# Patient Record
Sex: Male | Born: 1950 | Race: White | Hispanic: No | Marital: Single | Smoking: Never smoker
Health system: Southern US, Community
[De-identification: ages and names within clinical notes are randomized; demographics above are authoritative.]

## PROBLEM LIST (undated history)

## (undated) DIAGNOSIS — Z7901 Long term (current) use of anticoagulants: Secondary | ICD-10-CM

## (undated) DIAGNOSIS — I314 Cardiac tamponade: Secondary | ICD-10-CM

## (undated) DIAGNOSIS — Q231 Congenital insufficiency of aortic valve: Secondary | ICD-10-CM

## (undated) DIAGNOSIS — R51 Headache: Secondary | ICD-10-CM

## (undated) DIAGNOSIS — F419 Anxiety disorder, unspecified: Secondary | ICD-10-CM

## (undated) DIAGNOSIS — M869 Osteomyelitis, unspecified: Secondary | ICD-10-CM

## (undated) DIAGNOSIS — Q2381 Bicuspid aortic valve: Secondary | ICD-10-CM

## (undated) DIAGNOSIS — K659 Peritonitis, unspecified: Secondary | ICD-10-CM

## (undated) DIAGNOSIS — R630 Anorexia: Secondary | ICD-10-CM

## (undated) DIAGNOSIS — R519 Headache, unspecified: Secondary | ICD-10-CM

## (undated) DIAGNOSIS — E039 Hypothyroidism, unspecified: Secondary | ICD-10-CM

## (undated) DIAGNOSIS — J189 Pneumonia, unspecified organism: Secondary | ICD-10-CM

## (undated) DIAGNOSIS — I639 Cerebral infarction, unspecified: Secondary | ICD-10-CM

## (undated) DIAGNOSIS — M1990 Unspecified osteoarthritis, unspecified site: Secondary | ICD-10-CM

## (undated) HISTORY — PX: CARDIAC SURGERY: SHX584

## (undated) HISTORY — DX: Anorexia: R63.0

---

## 1998-07-29 ENCOUNTER — Emergency Department (HOSPITAL_COMMUNITY): Admission: EM | Admit: 1998-07-29 | Discharge: 1998-07-29 | Payer: Self-pay | Admitting: Emergency Medicine

## 2011-05-21 HISTORY — PX: AORTIC VALVE REPLACEMENT: SHX41

## 2016-11-28 ENCOUNTER — Emergency Department (HOSPITAL_COMMUNITY): Payer: BLUE CROSS/BLUE SHIELD

## 2016-11-28 ENCOUNTER — Inpatient Hospital Stay (HOSPITAL_COMMUNITY): Payer: BLUE CROSS/BLUE SHIELD | Admitting: Certified Registered"

## 2016-11-28 ENCOUNTER — Encounter (HOSPITAL_COMMUNITY): Admission: EM | Disposition: A | Discharge status: Skilled Nursing Facility | Payer: Self-pay | Source: Home / Self Care

## 2016-11-28 ENCOUNTER — Encounter (HOSPITAL_COMMUNITY): Payer: Self-pay | Admitting: Interventional Radiology

## 2016-11-28 ENCOUNTER — Inpatient Hospital Stay (HOSPITAL_COMMUNITY): Payer: BLUE CROSS/BLUE SHIELD | Admitting: Anesthesiology

## 2016-11-28 ENCOUNTER — Inpatient Hospital Stay (HOSPITAL_COMMUNITY): Payer: BLUE CROSS/BLUE SHIELD

## 2016-11-28 ENCOUNTER — Inpatient Hospital Stay (HOSPITAL_COMMUNITY)
Admission: EM | Admit: 2016-11-28 | Discharge: 2017-01-01 | DRG: 957 | Disposition: A | Discharge status: Skilled Nursing Facility | Payer: BLUE CROSS/BLUE SHIELD | Attending: General Surgery | Admitting: General Surgery

## 2016-11-28 ENCOUNTER — Encounter (HOSPITAL_COMMUNITY): Payer: Self-pay | Admitting: Anesthesiology

## 2016-11-28 DIAGNOSIS — Z7901 Long term (current) use of anticoagulants: Secondary | ICD-10-CM

## 2016-11-28 DIAGNOSIS — M8618 Other acute osteomyelitis, other site: Secondary | ICD-10-CM | POA: Diagnosis not present

## 2016-11-28 DIAGNOSIS — R609 Edema, unspecified: Secondary | ICD-10-CM

## 2016-11-28 DIAGNOSIS — D6489 Other specified anemias: Secondary | ICD-10-CM | POA: Diagnosis present

## 2016-11-28 DIAGNOSIS — S329XXA Fracture of unspecified parts of lumbosacral spine and pelvis, initial encounter for closed fracture: Secondary | ICD-10-CM | POA: Diagnosis present

## 2016-11-28 DIAGNOSIS — T826XXA Infection and inflammatory reaction due to cardiac valve prosthesis, initial encounter: Secondary | ICD-10-CM | POA: Diagnosis not present

## 2016-11-28 DIAGNOSIS — M86151 Other acute osteomyelitis, right femur: Secondary | ICD-10-CM | POA: Diagnosis not present

## 2016-11-28 DIAGNOSIS — S83521A Sprain of posterior cruciate ligament of right knee, initial encounter: Secondary | ICD-10-CM | POA: Diagnosis present

## 2016-11-28 DIAGNOSIS — I441 Atrioventricular block, second degree: Secondary | ICD-10-CM | POA: Diagnosis present

## 2016-11-28 DIAGNOSIS — T148XXA Other injury of unspecified body region, initial encounter: Secondary | ICD-10-CM

## 2016-11-28 DIAGNOSIS — S62312A Displaced fracture of base of third metacarpal bone, right hand, initial encounter for closed fracture: Secondary | ICD-10-CM | POA: Diagnosis present

## 2016-11-28 DIAGNOSIS — M25362 Other instability, left knee: Secondary | ICD-10-CM | POA: Diagnosis present

## 2016-11-28 DIAGNOSIS — I38 Endocarditis, valve unspecified: Secondary | ICD-10-CM | POA: Diagnosis not present

## 2016-11-28 DIAGNOSIS — S32110A Nondisplaced Zone I fracture of sacrum, initial encounter for closed fracture: Secondary | ICD-10-CM | POA: Diagnosis present

## 2016-11-28 DIAGNOSIS — E870 Hyperosmolality and hypernatremia: Secondary | ICD-10-CM

## 2016-11-28 DIAGNOSIS — T1490XA Injury, unspecified, initial encounter: Secondary | ICD-10-CM | POA: Diagnosis not present

## 2016-11-28 DIAGNOSIS — B9689 Other specified bacterial agents as the cause of diseases classified elsewhere: Secondary | ICD-10-CM | POA: Diagnosis present

## 2016-11-28 DIAGNOSIS — Z452 Encounter for adjustment and management of vascular access device: Secondary | ICD-10-CM

## 2016-11-28 DIAGNOSIS — N4889 Other specified disorders of penis: Secondary | ICD-10-CM | POA: Diagnosis present

## 2016-11-28 DIAGNOSIS — J9690 Respiratory failure, unspecified, unspecified whether with hypoxia or hypercapnia: Secondary | ICD-10-CM | POA: Diagnosis present

## 2016-11-28 DIAGNOSIS — S92101A Unspecified fracture of right talus, initial encounter for closed fracture: Secondary | ICD-10-CM | POA: Diagnosis present

## 2016-11-28 DIAGNOSIS — S332XXA Dislocation of sacroiliac and sacrococcygeal joint, initial encounter: Secondary | ICD-10-CM | POA: Diagnosis present

## 2016-11-28 DIAGNOSIS — S32810A Multiple fractures of pelvis with stable disruption of pelvic ring, initial encounter for closed fracture: Principal | ICD-10-CM | POA: Diagnosis present

## 2016-11-28 DIAGNOSIS — N3289 Other specified disorders of bladder: Secondary | ICD-10-CM | POA: Diagnosis not present

## 2016-11-28 DIAGNOSIS — M545 Low back pain: Secondary | ICD-10-CM | POA: Diagnosis present

## 2016-11-28 DIAGNOSIS — R739 Hyperglycemia, unspecified: Secondary | ICD-10-CM | POA: Diagnosis not present

## 2016-11-28 DIAGNOSIS — R0902 Hypoxemia: Secondary | ICD-10-CM

## 2016-11-28 DIAGNOSIS — T847XXA Infection and inflammatory reaction due to other internal orthopedic prosthetic devices, implants and grafts, initial encounter: Secondary | ICD-10-CM

## 2016-11-28 DIAGNOSIS — Z952 Presence of prosthetic heart valve: Secondary | ICD-10-CM | POA: Diagnosis not present

## 2016-11-28 DIAGNOSIS — S334XXA Traumatic rupture of symphysis pubis, initial encounter: Secondary | ICD-10-CM | POA: Diagnosis present

## 2016-11-28 DIAGNOSIS — E039 Hypothyroidism, unspecified: Secondary | ICD-10-CM | POA: Diagnosis present

## 2016-11-28 DIAGNOSIS — S329XXD Fracture of unspecified parts of lumbosacral spine and pelvis, subsequent encounter for fracture with routine healing: Secondary | ICD-10-CM | POA: Diagnosis not present

## 2016-11-28 DIAGNOSIS — Z9289 Personal history of other medical treatment: Secondary | ICD-10-CM

## 2016-11-28 DIAGNOSIS — T826XXD Infection and inflammatory reaction due to cardiac valve prosthesis, subsequent encounter: Secondary | ICD-10-CM | POA: Diagnosis not present

## 2016-11-28 DIAGNOSIS — I330 Acute and subacute infective endocarditis: Secondary | ICD-10-CM

## 2016-11-28 DIAGNOSIS — Z8673 Personal history of transient ischemic attack (TIA), and cerebral infarction without residual deficits: Secondary | ICD-10-CM

## 2016-11-28 DIAGNOSIS — M25561 Pain in right knee: Secondary | ICD-10-CM | POA: Diagnosis not present

## 2016-11-28 DIAGNOSIS — M25562 Pain in left knee: Secondary | ICD-10-CM | POA: Diagnosis not present

## 2016-11-28 DIAGNOSIS — A499 Bacterial infection, unspecified: Secondary | ICD-10-CM | POA: Diagnosis not present

## 2016-11-28 DIAGNOSIS — R310 Gross hematuria: Secondary | ICD-10-CM | POA: Diagnosis not present

## 2016-11-28 DIAGNOSIS — M25329 Other instability, unspecified elbow: Secondary | ICD-10-CM

## 2016-11-28 DIAGNOSIS — R52 Pain, unspecified: Secondary | ICD-10-CM

## 2016-11-28 DIAGNOSIS — R55 Syncope and collapse: Secondary | ICD-10-CM | POA: Diagnosis not present

## 2016-11-28 DIAGNOSIS — R1313 Dysphagia, pharyngeal phase: Secondary | ICD-10-CM | POA: Diagnosis present

## 2016-11-28 DIAGNOSIS — S3681XA Injury of peritoneum, initial encounter: Secondary | ICD-10-CM | POA: Diagnosis present

## 2016-11-28 DIAGNOSIS — S82202B Unspecified fracture of shaft of left tibia, initial encounter for open fracture type I or II: Secondary | ICD-10-CM | POA: Diagnosis present

## 2016-11-28 DIAGNOSIS — S62151A Displaced fracture of hook process of hamate [unciform] bone, right wrist, initial encounter for closed fracture: Secondary | ICD-10-CM | POA: Diagnosis present

## 2016-11-28 DIAGNOSIS — D62 Acute posthemorrhagic anemia: Secondary | ICD-10-CM | POA: Diagnosis present

## 2016-11-28 DIAGNOSIS — T814XXA Infection following a procedure, initial encounter: Secondary | ICD-10-CM | POA: Diagnosis not present

## 2016-11-28 DIAGNOSIS — S86922A Laceration of unspecified muscle(s) and tendon(s) at lower leg level, left leg, initial encounter: Secondary | ICD-10-CM | POA: Diagnosis present

## 2016-11-28 DIAGNOSIS — S299XXA Unspecified injury of thorax, initial encounter: Secondary | ICD-10-CM

## 2016-11-28 DIAGNOSIS — S299XXD Unspecified injury of thorax, subsequent encounter: Secondary | ICD-10-CM | POA: Diagnosis not present

## 2016-11-28 DIAGNOSIS — J9811 Atelectasis: Secondary | ICD-10-CM | POA: Diagnosis not present

## 2016-11-28 DIAGNOSIS — R3915 Urgency of urination: Secondary | ICD-10-CM | POA: Diagnosis not present

## 2016-11-28 DIAGNOSIS — R0682 Tachypnea, not elsewhere classified: Secondary | ICD-10-CM | POA: Diagnosis not present

## 2016-11-28 DIAGNOSIS — S22021A Stable burst fracture of second thoracic vertebra, initial encounter for closed fracture: Secondary | ICD-10-CM | POA: Diagnosis present

## 2016-11-28 DIAGNOSIS — S83511A Sprain of anterior cruciate ligament of right knee, initial encounter: Secondary | ICD-10-CM | POA: Diagnosis present

## 2016-11-28 DIAGNOSIS — R571 Hypovolemic shock: Secondary | ICD-10-CM | POA: Diagnosis present

## 2016-11-28 DIAGNOSIS — S92211A Displaced fracture of cuboid bone of right foot, initial encounter for closed fracture: Secondary | ICD-10-CM | POA: Diagnosis present

## 2016-11-28 DIAGNOSIS — M12559 Traumatic arthropathy, unspecified hip: Secondary | ICD-10-CM

## 2016-11-28 DIAGNOSIS — D72829 Elevated white blood cell count, unspecified: Secondary | ICD-10-CM

## 2016-11-28 DIAGNOSIS — T847XXD Infection and inflammatory reaction due to other internal orthopedic prosthetic devices, implants and grafts, subsequent encounter: Secondary | ICD-10-CM | POA: Diagnosis not present

## 2016-11-28 DIAGNOSIS — I350 Nonrheumatic aortic (valve) stenosis: Secondary | ICD-10-CM | POA: Diagnosis not present

## 2016-11-28 DIAGNOSIS — Z978 Presence of other specified devices: Secondary | ICD-10-CM

## 2016-11-28 DIAGNOSIS — Z419 Encounter for procedure for purposes other than remedying health state, unspecified: Secondary | ICD-10-CM

## 2016-11-28 DIAGNOSIS — J942 Hemothorax: Secondary | ICD-10-CM

## 2016-11-28 DIAGNOSIS — Y92411 Interstate highway as the place of occurrence of the external cause: Secondary | ICD-10-CM

## 2016-11-28 DIAGNOSIS — Q899 Congenital malformation, unspecified: Secondary | ICD-10-CM

## 2016-11-28 DIAGNOSIS — S63054A Dislocation of other carpometacarpal joint of right hand, initial encounter: Secondary | ICD-10-CM

## 2016-11-28 DIAGNOSIS — R001 Bradycardia, unspecified: Secondary | ICD-10-CM | POA: Diagnosis not present

## 2016-11-28 DIAGNOSIS — S63054D Dislocation of other carpometacarpal joint of right hand, subsequent encounter: Secondary | ICD-10-CM | POA: Diagnosis not present

## 2016-11-28 DIAGNOSIS — S32810D Multiple fractures of pelvis with stable disruption of pelvic ring, subsequent encounter for fracture with routine healing: Secondary | ICD-10-CM | POA: Diagnosis not present

## 2016-11-28 DIAGNOSIS — I499 Cardiac arrhythmia, unspecified: Secondary | ICD-10-CM | POA: Diagnosis not present

## 2016-11-28 DIAGNOSIS — M25569 Pain in unspecified knee: Secondary | ICD-10-CM

## 2016-11-28 DIAGNOSIS — R451 Restlessness and agitation: Secondary | ICD-10-CM | POA: Diagnosis not present

## 2016-11-28 DIAGNOSIS — D696 Thrombocytopenia, unspecified: Secondary | ICD-10-CM | POA: Diagnosis not present

## 2016-11-28 HISTORY — DX: Hypothyroidism, unspecified: E03.9

## 2016-11-28 HISTORY — PX: ULTRASOUND GUIDANCE FOR VASCULAR ACCESS: SHX6516

## 2016-11-28 HISTORY — PX: I&D EXTREMITY: SHX5045

## 2016-11-28 HISTORY — PX: IR HYBRID TRAUMA EMBOLIZATION: IMG5539

## 2016-11-28 HISTORY — PX: EXTERNAL FIXATION PELVIS: SHX1551

## 2016-11-28 HISTORY — PX: PELVIC ANGIOGRAPHY: CATH118254

## 2016-11-28 HISTORY — PX: APPLICATION OF WOUND VAC: SHX5189

## 2016-11-28 HISTORY — PX: SACROILIAC JOINT FUSION: SHX6088

## 2016-11-28 HISTORY — DX: Bicuspid aortic valve: Q23.81

## 2016-11-28 HISTORY — DX: Congenital insufficiency of aortic valve: Q23.1

## 2016-11-28 LAB — POCT I-STAT 7, (LYTES, BLD GAS, ICA,H+H)
ACID-BASE DEFICIT: 9 mmol/L — AB (ref 0.0–2.0)
ACID-BASE DEFICIT: 8 mmol/L — AB (ref 0.0–2.0)
Acid-base deficit: 7 mmol/L — ABNORMAL HIGH (ref 0.0–2.0)
Acid-base deficit: 4 mmol/L — ABNORMAL HIGH (ref 0.0–2.0)
BICARBONATE: 18.1 mmol/L — AB (ref 20.0–28.0)
BICARBONATE: 21.7 mmol/L (ref 20.0–28.0)
Bicarbonate: 19.8 mmol/L — ABNORMAL LOW (ref 20.0–28.0)
Bicarbonate: 17.3 mmol/L — ABNORMAL LOW (ref 20.0–28.0)
CALCIUM ION: 0.94 mmol/L — AB (ref 1.15–1.40)
Calcium, Ion: 1.01 mmol/L — ABNORMAL LOW (ref 1.15–1.40)
Calcium, Ion: 0.76 mmol/L — CL (ref 1.15–1.40)
Calcium, Ion: 1.04 mmol/L — ABNORMAL LOW (ref 1.15–1.40)
HCT: 26 % — ABNORMAL LOW (ref 39.0–52.0)
HCT: 27 % — ABNORMAL LOW (ref 39.0–52.0)
HEMATOCRIT: 20 % — AB (ref 39.0–52.0)
HEMATOCRIT: 27 % — AB (ref 39.0–52.0)
HEMOGLOBIN: 9.2 g/dL — AB (ref 13.0–17.0)
HEMOGLOBIN: 8.8 g/dL — AB (ref 13.0–17.0)
Hemoglobin: 6.8 g/dL — CL (ref 13.0–17.0)
Hemoglobin: 9.2 g/dL — ABNORMAL LOW (ref 13.0–17.0)
O2 SAT: 100 %
O2 SAT: 100 %
O2 SAT: 100 %
O2 Saturation: 100 %
PCO2 ART: 40 mmHg (ref 32.0–48.0)
PCO2 ART: 31.8 mmHg — AB (ref 32.0–48.0)
PH ART: 7.319 — AB (ref 7.350–7.450)
PO2 ART: 551 mmHg — AB (ref 83.0–108.0)
PO2 ART: 178 mmHg — AB (ref 83.0–108.0)
PO2 ART: 440 mmHg — AB (ref 83.0–108.0)
POTASSIUM: 3.9 mmol/L (ref 3.5–5.1)
POTASSIUM: 4.5 mmol/L (ref 3.5–5.1)
Patient temperature: 35
Patient temperature: 34
Potassium: 4.2 mmol/L (ref 3.5–5.1)
Potassium: 4.2 mmol/L (ref 3.5–5.1)
SODIUM: 142 mmol/L (ref 135–145)
Sodium: 138 mmol/L (ref 135–145)
Sodium: 139 mmol/L (ref 135–145)
Sodium: 142 mmol/L (ref 135–145)
TCO2: 21 mmol/L (ref 0–100)
TCO2: 18 mmol/L (ref 0–100)
TCO2: 19 mmol/L (ref 0–100)
TCO2: 23 mmol/L (ref 0–100)
pCO2 arterial: 33.9 mmHg (ref 32.0–48.0)
pCO2 arterial: 36.7 mmHg (ref 32.0–48.0)
pH, Arterial: 7.292 — ABNORMAL LOW (ref 7.350–7.450)
pH, Arterial: 7.32 — ABNORMAL LOW (ref 7.350–7.450)
pH, Arterial: 7.362 (ref 7.350–7.450)
pO2, Arterial: 299 mmHg — ABNORMAL HIGH (ref 83.0–108.0)

## 2016-11-28 LAB — COMPREHENSIVE METABOLIC PANEL
ALK PHOS: 57 U/L (ref 38–126)
ALK PHOS: 44 U/L (ref 38–126)
ALT: 19 U/L (ref 17–63)
ALT: 26 U/L (ref 17–63)
ANION GAP: 10 (ref 5–15)
AST: 45 U/L — AB (ref 15–41)
AST: 51 U/L — ABNORMAL HIGH (ref 15–41)
Albumin: 3.8 g/dL (ref 3.5–5.0)
Albumin: 3.2 g/dL — ABNORMAL LOW (ref 3.5–5.0)
Anion gap: 10 (ref 5–15)
BILIRUBIN TOTAL: 0.8 mg/dL (ref 0.3–1.2)
BILIRUBIN TOTAL: 2 mg/dL — AB (ref 0.3–1.2)
BUN: 14 mg/dL (ref 6–20)
BUN: 12 mg/dL (ref 6–20)
CALCIUM: 8.5 mg/dL — AB (ref 8.9–10.3)
CO2: 18 mmol/L — ABNORMAL LOW (ref 22–32)
CO2: 20 mmol/L — AB (ref 22–32)
Calcium: 7.7 mg/dL — ABNORMAL LOW (ref 8.9–10.3)
Chloride: 107 mmol/L (ref 101–111)
Chloride: 109 mmol/L (ref 101–111)
Creatinine, Ser: 1.23 mg/dL (ref 0.61–1.24)
Creatinine, Ser: 1.01 mg/dL (ref 0.61–1.24)
GFR calc Af Amer: >60 mL/min (ref >60)
GFR calc non Af Amer: >60 mL/min (ref >60)
GLUCOSE: 152 mg/dL — AB (ref 65–99)
Glucose, Bld: 162 mg/dL — ABNORMAL HIGH (ref 65–99)
POTASSIUM: 4.4 mmol/L (ref 3.5–5.1)
Potassium: 4.4 mmol/L (ref 3.5–5.1)
Sodium: 135 mmol/L (ref 135–145)
Sodium: 139 mmol/L (ref 135–145)
TOTAL PROTEIN: 6.3 g/dL — AB (ref 6.5–8.1)
Total Protein: 5.1 g/dL — ABNORMAL LOW (ref 6.5–8.1)

## 2016-11-28 LAB — CBC
HCT: 29.8 % — ABNORMAL LOW (ref 39.0–52.0)
HEMATOCRIT: 37.2 % — AB (ref 39.0–52.0)
HEMATOCRIT: 25.3 % — AB (ref 39.0–52.0)
HEMOGLOBIN: 8.6 g/dL — AB (ref 13.0–17.0)
Hemoglobin: 13.1 g/dL (ref 13.0–17.0)
Hemoglobin: 9.9 g/dL — ABNORMAL LOW (ref 13.0–17.0)
MCH: 33.2 pg (ref 26.0–34.0)
MCH: 30.4 pg (ref 26.0–34.0)
MCH: 28.4 pg (ref 26.0–34.0)
MCHC: 35.2 g/dL (ref 30.0–36.0)
MCHC: 34 g/dL (ref 30.0–36.0)
MCHC: 33.2 g/dL (ref 30.0–36.0)
MCV: 94.4 fL (ref 78.0–100.0)
MCV: 89.4 fL (ref 78.0–100.0)
MCV: 85.4 fL (ref 78.0–100.0)
PLATELETS: 151 10*3/uL (ref 150–400)
PLATELETS: 173 10*3/uL (ref 150–400)
Platelets: 43 10*3/uL — ABNORMAL LOW (ref 150–400)
RBC: 3.94 MIL/uL — ABNORMAL LOW (ref 4.22–5.81)
RBC: 2.83 MIL/uL — ABNORMAL LOW (ref 4.22–5.81)
RBC: 3.49 MIL/uL — AB (ref 4.22–5.81)
RDW: 12.2 % (ref 11.5–15.5)
RDW: 14.2 % (ref 11.5–15.5)
RDW: 14.8 % (ref 11.5–15.5)
WBC: 5.7 10*3/uL (ref 4.0–10.5)
WBC: 10.5 10*3/uL (ref 4.0–10.5)
WBC: 6.5 10*3/uL (ref 4.0–10.5)

## 2016-11-28 LAB — BLOOD GAS, ARTERIAL
Acid-base deficit: 4.9 mmol/L — ABNORMAL HIGH (ref 0.0–2.0)
Acid-base deficit: 1.6 mmol/L (ref 0.0–2.0)
BICARBONATE: 23 mmol/L (ref 20.0–28.0)
Bicarbonate: 20.1 mmol/L (ref 20.0–28.0)
DRAWN BY: 365271
Drawn by: 331761
FIO2: 60
FIO2: 60
LHR: 16 {breaths}/min
LHR: 16 {breaths}/min
MECHVT: 550 mL
MECHVT: 550 mL
O2 SAT: 98.2 %
O2 Saturation: 99.3 %
PCO2 ART: 34.8 mmHg (ref 32.0–48.0)
PEEP: 5 cmH2O
PEEP: 8 cmH2O
PO2 ART: 86.6 mmHg (ref 83.0–108.0)
PO2 ART: 212 mmHg — AB (ref 83.0–108.0)
Patient temperature: 93.7
Patient temperature: 96.4
pCO2 arterial: 39.2 mmHg (ref 32.0–48.0)
pH, Arterial: 7.362 (ref 7.350–7.450)
pH, Arterial: 7.38 (ref 7.350–7.450)

## 2016-11-28 LAB — PROTIME-INR
INR: 3.98
INR: 2.37
INR: 1.32
PROTHROMBIN TIME: 26.3 s — AB (ref 11.4–15.2)
PROTHROMBIN TIME: 16.5 s — AB (ref 11.4–15.2)
Prothrombin Time: 40.2 seconds — ABNORMAL HIGH (ref 11.4–15.2)

## 2016-11-28 LAB — URINALYSIS, ROUTINE W REFLEX MICROSCOPIC
BILIRUBIN URINE: NEGATIVE
Bacteria, UA: NONE SEEN
GLUCOSE, UA: 50 mg/dL — AB
Ketones, ur: NEGATIVE mg/dL
Leukocytes, UA: NEGATIVE
NITRITE: NEGATIVE
PH: 7 (ref 5.0–8.0)
Protein, ur: 100 mg/dL — AB
SPECIFIC GRAVITY, URINE: 1.019 (ref 1.005–1.030)
Squamous Epithelial / LPF: NONE SEEN

## 2016-11-28 LAB — I-STAT CHEM 8, ED
BUN: 18 mg/dL (ref 6–20)
CALCIUM ION: 1 mmol/L — AB (ref 1.15–1.40)
CREATININE: 1.1 mg/dL (ref 0.61–1.24)
Chloride: 105 mmol/L (ref 101–111)
Glucose, Bld: 160 mg/dL — ABNORMAL HIGH (ref 65–99)
HCT: 38 % — ABNORMAL LOW (ref 39.0–52.0)
Hemoglobin: 12.9 g/dL — ABNORMAL LOW (ref 13.0–17.0)
Potassium: 4.1 mmol/L (ref 3.5–5.1)
Sodium: 138 mmol/L (ref 135–145)
TCO2: 21 mmol/L (ref 0–100)

## 2016-11-28 LAB — LACTIC ACID, PLASMA: LACTIC ACID, VENOUS: 5.1 mmol/L — AB (ref 0.5–1.9)

## 2016-11-28 LAB — MRSA PCR SCREENING: MRSA by PCR: NEGATIVE

## 2016-11-28 LAB — PREPARE RBC (CROSSMATCH)

## 2016-11-28 LAB — DIC (DISSEMINATED INTRAVASCULAR COAGULATION)PANEL
Fibrinogen: 267 mg/dL (ref 210–475)
INR: 1.5
Prothrombin Time: 18.3 seconds — ABNORMAL HIGH (ref 11.4–15.2)
aPTT: 37 seconds — ABNORMAL HIGH (ref 24–36)

## 2016-11-28 LAB — CDS SEROLOGY

## 2016-11-28 LAB — TRIGLYCERIDES: TRIGLYCERIDES: 145 mg/dL (ref <150)

## 2016-11-28 LAB — DIC (DISSEMINATED INTRAVASCULAR COAGULATION) PANEL
PLATELETS: 90 10*3/uL — AB (ref 150–400)
SMEAR REVIEW: NONE SEEN

## 2016-11-28 LAB — I-STAT CG4 LACTIC ACID, ED: Lactic Acid, Venous: 3.26 mmol/L (ref 0.5–1.9)

## 2016-11-28 LAB — ABO/RH: ABO/RH(D): A POS

## 2016-11-28 LAB — FIBRINOGEN

## 2016-11-28 LAB — ETHANOL

## 2016-11-28 SURGERY — ANGIOGRAM EXTREMITY RIGHT
Anesthesia: General | Site: Pelvis | Laterality: Right

## 2016-11-28 SURGERY — EXTERNAL FIXATION, PELVIS
Anesthesia: General | Site: Pelvis

## 2016-11-28 SURGERY — OPEN REDUCTION INTERNAL FIXATION (ORIF) HAND
Anesthesia: General | Laterality: Right

## 2016-11-28 MED ORDER — ROCURONIUM BROMIDE 100 MG/10ML IV SOLN
INTRAVENOUS | Status: DC | PRN
  Administered 2016-11-28: 50 mg via INTRAVENOUS

## 2016-11-28 MED ORDER — PROTHROMBIN COMPLEX CONC HUMAN 500 UNITS IV KIT
542.0000 [IU] | PACK | Freq: Once | Status: AC
  Administered 2016-11-28: 550 [IU] via INTRAVENOUS
  Filled 2016-11-28: qty 22

## 2016-11-28 MED ORDER — PROPOFOL 1000 MG/100ML IV EMUL
INTRAVENOUS | Status: AC
  Filled 2016-11-28: qty 100

## 2016-11-28 MED ORDER — HYDROMORPHONE HCL 1 MG/ML IJ SOLN: 1.0000 mg | INTRAMUSCULAR | Status: DC | PRN

## 2016-11-28 MED ORDER — IOPAMIDOL (ISOVUE-300) INJECTION 61%
INTRAVENOUS | Status: AC
  Filled 2016-11-28: qty 100

## 2016-11-28 MED ORDER — FENTANYL CITRATE (PF) 250 MCG/5ML IJ SOLN
INTRAMUSCULAR | Status: AC
  Filled 2016-11-28: qty 5

## 2016-11-28 MED ORDER — SUCCINYLCHOLINE CHLORIDE 20 MG/ML IJ SOLN
INTRAMUSCULAR | Status: DC | PRN
  Administered 2016-11-28: 140 mg via INTRAVENOUS

## 2016-11-28 MED ORDER — PHENYLEPHRINE HCL 10 MG/ML IJ SOLN
INTRAMUSCULAR | Status: DC | PRN
  Administered 2016-11-28 (×3): 80 ug via INTRAVENOUS
  Administered 2016-11-28: 120 ug via INTRAVENOUS
  Administered 2016-11-28: 80 ug via INTRAVENOUS
  Administered 2016-11-28: 200 ug via INTRAVENOUS

## 2016-11-28 MED ORDER — CALCIUM CHLORIDE 10 % IV SOLN
INTRAVENOUS | Status: DC | PRN
  Administered 2016-11-28 (×2): 1 g via INTRAVENOUS

## 2016-11-28 MED ORDER — PHENYLEPHRINE HCL 10 MG/ML IJ SOLN
INTRAVENOUS | Status: DC | PRN
  Administered 2016-11-28: 50 ug/min via INTRAVENOUS
  Administered 2016-11-28: 25 ug via INTRAVENOUS

## 2016-11-28 MED ORDER — PROPOFOL 500 MG/50ML IV EMUL
INTRAVENOUS | Status: DC | PRN
  Administered 2016-11-28: 65 ug/kg/min via INTRAVENOUS

## 2016-11-28 MED ORDER — LACTATED RINGERS IV SOLN
INTRAVENOUS | Status: DC | PRN
  Administered 2016-11-28: 13:00:00 via INTRAVENOUS

## 2016-11-28 MED ORDER — ONDANSETRON 4 MG PO TBDP
4.0000 mg | ORAL_TABLET | Freq: Four times a day (QID) | ORAL | Status: DC | PRN
Start: 2016-11-28 — End: 2017-01-01
  Filled 2016-11-28: qty 1

## 2016-11-28 MED ORDER — ACETAMINOPHEN 160 MG/5ML PO SOLN
650.0000 mg | Freq: Four times a day (QID) | ORAL | Status: DC | PRN
  Administered 2016-12-05 – 2016-12-09 (×2): 650 mg
  Filled 2016-11-28 (×2): qty 20.3

## 2016-11-28 MED ORDER — SODIUM CHLORIDE 0.9 % IV BOLUS (SEPSIS)
1000.0000 mL | Freq: Once | INTRAVENOUS | Status: AC
  Administered 2016-11-28: 1000 mL via INTRAVENOUS

## 2016-11-28 MED ORDER — PANTOPRAZOLE SODIUM 40 MG IV SOLR
40.0000 mg | Freq: Every day | INTRAVENOUS | Status: DC
  Administered 2016-11-29 – 2016-12-05 (×7): 40 mg via INTRAVENOUS
  Filled 2016-11-28 (×8): qty 40

## 2016-11-28 MED ORDER — SODIUM CHLORIDE 0.9 % IV SOLN
10.0000 mL/h | Freq: Once | INTRAVENOUS | Status: AC
  Administered 2016-11-28: 10 mL/h via INTRAVENOUS

## 2016-11-28 MED ORDER — FENTANYL CITRATE (PF) 100 MCG/2ML IJ SOLN
25.0000 ug | Freq: Once | INTRAMUSCULAR | Status: AC
  Administered 2016-11-28: 25 ug via INTRAVENOUS

## 2016-11-28 MED ORDER — LACTATED RINGERS IV SOLN
INTRAVENOUS | Status: DC | PRN
  Administered 2016-11-28 (×2): via INTRAVENOUS

## 2016-11-28 MED ORDER — HEPARIN SODIUM (PORCINE) 1000 UNIT/ML IJ SOLN
INTRAMUSCULAR | Status: AC
  Filled 2016-11-28: qty 1

## 2016-11-28 MED ORDER — ONDANSETRON HCL 4 MG/2ML IJ SOLN
4.0000 mg | Freq: Four times a day (QID) | INTRAMUSCULAR | Status: DC | PRN
  Administered 2016-12-10 – 2016-12-19 (×2): 4 mg via INTRAVENOUS
  Filled 2016-11-28 (×2): qty 2

## 2016-11-28 MED ORDER — SODIUM CHLORIDE 0.9 % IV SOLN
INTRAVENOUS | Status: DC | PRN
  Administered 2016-11-28: 500 mL

## 2016-11-28 MED ORDER — CEFAZOLIN SODIUM-DEXTROSE 1-4 GM/50ML-% IV SOLN
1.0000 g | Freq: Three times a day (TID) | INTRAVENOUS | Status: AC
  Administered 2016-11-28 – 2016-11-30 (×6): 1 g via INTRAVENOUS
  Filled 2016-11-28 (×6): qty 50

## 2016-11-28 MED ORDER — OXYCODONE HCL 5 MG PO TABS: 5.0000 mg | ORAL_TABLET | ORAL | Status: DC | PRN

## 2016-11-28 MED ORDER — CHLORHEXIDINE GLUCONATE 0.12% ORAL RINSE (MEDLINE KIT)
15.0000 mL | Freq: Two times a day (BID) | OROMUCOSAL | Status: DC
  Administered 2016-11-28 – 2016-12-31 (×52): 15 mL via OROMUCOSAL

## 2016-11-28 MED ORDER — ROCURONIUM BROMIDE 100 MG/10ML IV SOLN
INTRAVENOUS | Status: DC | PRN
  Administered 2016-11-28 (×3): 50 mg via INTRAVENOUS

## 2016-11-28 MED ORDER — PROPOFOL 1000 MG/100ML IV EMUL
0.0000 ug/kg/min | INTRAVENOUS | Status: DC
  Administered 2016-11-28: 50 ug/kg/min via INTRAVENOUS
  Administered 2016-11-29 (×3): 30 ug/kg/min via INTRAVENOUS
  Administered 2016-11-29: 25 ug/kg/min via INTRAVENOUS
  Administered 2016-11-30 (×2): 30 ug/kg/min via INTRAVENOUS
  Administered 2016-11-30: 25 ug/kg/min via INTRAVENOUS
  Administered 2016-11-30: 30 ug/kg/min via INTRAVENOUS
  Administered 2016-12-01: 20 ug/kg/min via INTRAVENOUS
  Administered 2016-12-01: 30 ug/kg/min via INTRAVENOUS
  Administered 2016-12-01: 20 ug/kg/min via INTRAVENOUS
  Administered 2016-12-02: 10 ug/kg/min via INTRAVENOUS
  Administered 2016-12-02 – 2016-12-06 (×11): 20 ug/kg/min via INTRAVENOUS
  Filled 2016-11-28 (×22): qty 100

## 2016-11-28 MED ORDER — PROPOFOL 10 MG/ML IV BOLUS
INTRAVENOUS | Status: AC
  Filled 2016-11-28: qty 20

## 2016-11-28 MED ORDER — SODIUM CHLORIDE 0.9 % IV SOLN: Freq: Once | INTRAVENOUS | Status: DC

## 2016-11-28 MED ORDER — ALBUMIN HUMAN 5 % IV SOLN
INTRAVENOUS | Status: DC | PRN
  Administered 2016-11-28: 13:00:00 via INTRAVENOUS

## 2016-11-28 MED ORDER — EPHEDRINE SULFATE 50 MG/ML IJ SOLN
INTRAMUSCULAR | Status: DC | PRN
  Administered 2016-11-28 (×2): 10 mg via INTRAVENOUS
  Administered 2016-11-28: 5 mg via INTRAVENOUS
  Administered 2016-11-28: 10 mg via INTRAVENOUS
  Administered 2016-11-28: 5 mg via INTRAVENOUS

## 2016-11-28 MED ORDER — FENTANYL CITRATE (PF) 100 MCG/2ML IJ SOLN
INTRAMUSCULAR | Status: DC | PRN
  Administered 2016-11-28: 25 ug via INTRAVENOUS
  Administered 2016-11-28: 100 ug via INTRAVENOUS
  Administered 2016-11-28: 25 ug via INTRAVENOUS

## 2016-11-28 MED ORDER — PHENYLEPHRINE HCL 10 MG/ML IJ SOLN
INTRAVENOUS | Status: DC | PRN
  Administered 2016-11-28: 50 ug/min via INTRAVENOUS

## 2016-11-28 MED ORDER — SODIUM BICARBONATE 8.4 % IV SOLN
INTRAVENOUS | Status: DC | PRN
  Administered 2016-11-28 (×3): 50 meq via INTRAVENOUS

## 2016-11-28 MED ORDER — FENTANYL CITRATE (PF) 100 MCG/2ML IJ SOLN
INTRAMUSCULAR | Status: AC
  Administered 2016-11-28: 50 ug
  Filled 2016-11-28: qty 2

## 2016-11-28 MED ORDER — TRANEXAMIC ACID 1000 MG/10ML IV SOLN
1000.0000 mg | Freq: Once | INTRAVENOUS | Status: AC
  Administered 2016-11-28: 1000 mg via INTRAVENOUS
  Filled 2016-11-28: qty 10

## 2016-11-28 MED ORDER — FENTANYL BOLUS VIA INFUSION
50.0000 ug | INTRAVENOUS | Status: DC | PRN
  Administered 2016-12-05 (×2): 50 ug via INTRAVENOUS
  Filled 2016-11-28: qty 50

## 2016-11-28 MED ORDER — METOPROLOL TARTRATE 5 MG/5ML IV SOLN: 5.0000 mg | Freq: Four times a day (QID) | INTRAVENOUS | Status: DC | PRN

## 2016-11-28 MED ORDER — IOPAMIDOL (ISOVUE-300) INJECTION 61%
INTRAVENOUS | Status: AC
  Filled 2016-11-28: qty 150

## 2016-11-28 MED ORDER — FENTANYL CITRATE (PF) 250 MCG/5ML IJ SOLN
INTRAMUSCULAR | Status: DC | PRN
  Administered 2016-11-28 (×2): 50 ug via INTRAVENOUS

## 2016-11-28 MED ORDER — HYDRALAZINE HCL 20 MG/ML IJ SOLN
10.0000 mg | INTRAMUSCULAR | Status: DC | PRN
  Filled 2016-11-28: qty 1

## 2016-11-28 MED ORDER — ACETAMINOPHEN 325 MG PO TABS: 650.0000 mg | ORAL_TABLET | ORAL | Status: DC | PRN

## 2016-11-28 MED ORDER — VITAMIN K1 10 MG/ML IJ SOLN
10.0000 mg | INTRAVENOUS | Status: AC
  Administered 2016-11-28: 10 mg via INTRAVENOUS
  Filled 2016-11-28: qty 1

## 2016-11-28 MED ORDER — MIDAZOLAM HCL 2 MG/2ML IJ SOLN
INTRAMUSCULAR | Status: AC
  Filled 2016-11-28: qty 2

## 2016-11-28 MED ORDER — PROTHROMBIN COMPLEX CONC HUMAN 500 UNITS IV KIT
2754.0000 [IU] | PACK | Status: AC
  Administered 2016-11-28: 2754 [IU] via INTRAVENOUS
  Filled 2016-11-28: qty 110

## 2016-11-28 MED ORDER — CEFAZOLIN SODIUM-DEXTROSE 2-4 GM/100ML-% IV SOLN: 2.0000 g | Freq: Three times a day (TID) | INTRAVENOUS | Status: DC

## 2016-11-28 MED ORDER — 0.9 % SODIUM CHLORIDE (POUR BTL) OPTIME
TOPICAL | Status: DC | PRN
  Administered 2016-11-28: 2000 mL

## 2016-11-28 MED ORDER — IOTHALAMATE MEGLUMINE 17.2 % UR SOLN
250.0000 mL | Freq: Once | URETHRAL | Status: AC | PRN
  Administered 2016-11-28: 100 mL via INTRAVESICAL

## 2016-11-28 MED ORDER — HYDRALAZINE HCL 20 MG/ML IJ SOLN
10.0000 mg | INTRAMUSCULAR | Status: DC | PRN
  Administered 2016-11-28: 10 mg via INTRAVENOUS

## 2016-11-28 MED ORDER — ORAL CARE MOUTH RINSE
15.0000 mL | OROMUCOSAL | Status: DC
  Administered 2016-11-28 – 2016-12-06 (×76): 15 mL via OROMUCOSAL

## 2016-11-28 MED ORDER — ETOMIDATE 2 MG/ML IV SOLN
INTRAVENOUS | Status: DC | PRN
  Administered 2016-11-28: 18 mg via INTRAVENOUS

## 2016-11-28 MED ORDER — SODIUM CHLORIDE 0.9 % IV SOLN
Freq: Once | INTRAVENOUS | Status: AC
  Administered 2016-11-28: 16:00:00 via INTRAVENOUS

## 2016-11-28 MED ORDER — IOPAMIDOL (ISOVUE-300) INJECTION 61%
INTRAVENOUS | Status: DC | PRN
  Administered 2016-11-28: 115 mL via INTRAVENOUS

## 2016-11-28 MED ORDER — BISACODYL 10 MG RE SUPP
10.0000 mg | Freq: Every day | RECTAL | Status: DC | PRN
  Administered 2016-12-28: 10 mg via RECTAL
  Filled 2016-11-28 (×3): qty 1

## 2016-11-28 MED ORDER — PANTOPRAZOLE SODIUM 40 MG IV SOLR: 40.0000 mg | Freq: Every day | INTRAVENOUS | Status: DC

## 2016-11-28 MED ORDER — FENTANYL 2500MCG IN NS 250ML (10MCG/ML) PREMIX INFUSION
25.0000 ug/h | INTRAVENOUS | Status: DC
  Administered 2016-11-28: 50 ug/h via INTRAVENOUS
  Administered 2016-11-29 – 2016-12-01 (×8): 350 ug/h via INTRAVENOUS
  Administered 2016-12-01: 250 ug/h via INTRAVENOUS
  Administered 2016-12-02: 150 ug/h via INTRAVENOUS
  Administered 2016-12-02: 300 ug/h via INTRAVENOUS
  Administered 2016-12-02: 150 ug/h via INTRAVENOUS
  Administered 2016-12-03: 175 ug/h via INTRAVENOUS
  Administered 2016-12-03: 200 ug/h via INTRAVENOUS
  Administered 2016-12-04 – 2016-12-06 (×6): 250 ug/h via INTRAVENOUS
  Filled 2016-11-28 (×21): qty 250

## 2016-11-28 MED ORDER — SODIUM CHLORIDE 0.9 % IV SOLN: INTRAVENOUS | Status: DC

## 2016-11-28 MED ORDER — MIDAZOLAM HCL 5 MG/5ML IJ SOLN
INTRAMUSCULAR | Status: DC | PRN
  Administered 2016-11-28: 1 mg via INTRAVENOUS
  Administered 2016-11-28: 2 mg via INTRAVENOUS
  Administered 2016-11-28: 1 mg via INTRAVENOUS

## 2016-11-28 MED ORDER — CEFAZOLIN SODIUM 1 G IJ SOLR: INTRAMUSCULAR | Status: DC | PRN

## 2016-11-28 MED ORDER — IOPAMIDOL (ISOVUE-300) INJECTION 61%
INTRAVENOUS | Status: AC
  Administered 2016-11-28: 12:00:00
  Filled 2016-11-28: qty 100

## 2016-11-28 MED ORDER — SODIUM CHLORIDE 0.9 % IV SOLN
INTRAVENOUS | Status: DC
  Administered 2016-11-28 – 2016-12-24 (×9): via INTRAVENOUS

## 2016-11-28 MED ORDER — DOCUSATE SODIUM 100 MG PO CAPS: 100.0000 mg | ORAL_CAPSULE | Freq: Two times a day (BID) | ORAL | Status: DC

## 2016-11-28 MED ORDER — TRANEXAMIC ACID 1000 MG/10ML IV SOLN
1000.0000 mg | Freq: Once | INTRAVENOUS | Status: DC
  Filled 2016-11-28: qty 10

## 2016-11-28 MED ORDER — FENTANYL CITRATE (PF) 100 MCG/2ML IJ SOLN
50.0000 ug | Freq: Once | INTRAMUSCULAR | Status: AC
  Administered 2016-11-28: 50 ug via INTRAVENOUS

## 2016-11-28 MED ORDER — TRANEXAMIC ACID 1000 MG/10ML IV SOLN
1000.0000 mg | INTRAVENOUS | Status: DC
  Filled 2016-11-28: qty 10

## 2016-11-28 MED ORDER — SODIUM CHLORIDE 0.9 % IR SOLN
Status: DC | PRN
  Administered 2016-11-28: 3000 mL
  Administered 2016-11-28: 1000 mL

## 2016-11-28 MED ORDER — PANTOPRAZOLE SODIUM 40 MG PO TBEC: 40.0000 mg | DELAYED_RELEASE_TABLET | Freq: Every day | ORAL | Status: DC

## 2016-11-28 SURGICAL SUPPLY — 92 items
BANDAGE ACE 3X5.8 VEL STRL LF (GAUZE/BANDAGES/DRESSINGS) IMPLANT
BANDAGE ACE 4X5 VEL STRL LF (GAUZE/BANDAGES/DRESSINGS) ×12 IMPLANT
BANDAGE ACE 6X5 VEL STRL LF (GAUZE/BANDAGES/DRESSINGS) ×12 IMPLANT
BAR 380MM EXFIX CVD (EXFIX) ×1
BAR EXFIX CVD 11X380 (EXFIX) ×5 IMPLANT
BLADE SURG 15 STRL LF DISP TIS (BLADE) IMPLANT
BLADE SURG 15 STRL SS (BLADE)
BNDG COHESIVE 1X5 TAN STRL LF (GAUZE/BANDAGES/DRESSINGS) IMPLANT
BNDG COHESIVE 4X5 TAN STRL (GAUZE/BANDAGES/DRESSINGS) IMPLANT
BNDG COHESIVE 6X5 TAN STRL LF (GAUZE/BANDAGES/DRESSINGS) ×12 IMPLANT
BNDG CONFORM 3 STRL LF (GAUZE/BANDAGES/DRESSINGS) IMPLANT
BNDG GAUZE ELAST 4 BULKY (GAUZE/BANDAGES/DRESSINGS) ×18 IMPLANT
BNDG GAUZE STRTCH 6 (GAUZE/BANDAGES/DRESSINGS) IMPLANT
BRUSH SCRUB SURG 4.25 DISP (MISCELLANEOUS) ×36 IMPLANT
CANISTER WOUND CARE 500ML ATS (WOUND CARE) ×6 IMPLANT
CLAMP BLUE BAR TO PIN (EXFIX) ×12 IMPLANT
CORDS BIPOLAR (ELECTRODE) IMPLANT
COVER SURGICAL LIGHT HANDLE (MISCELLANEOUS) ×6 IMPLANT
CUFF TOURNIQUET SINGLE 24IN (TOURNIQUET CUFF) IMPLANT
CUFF TOURNIQUET SINGLE 34IN LL (TOURNIQUET CUFF) IMPLANT
CUFF TOURNIQUET SINGLE 44IN (TOURNIQUET CUFF) IMPLANT
DRAPE C-ARM 42X72 X-RAY (DRAPES) ×6 IMPLANT
DRAPE C-ARMOR (DRAPES) ×6 IMPLANT
DRAPE EXTREMITY BILATERAL (DRAPES) ×6 IMPLANT
DRAPE INCISE IOBAN 66X45 STRL (DRAPES) IMPLANT
DRAPE LAPAROTOMY TRNSV 102X78 (DRAPE) ×6 IMPLANT
DRAPE U-SHAPE 47X51 STRL (DRAPES) ×6 IMPLANT
DRESSING PREVENA PLUS CUSTOM (GAUZE/BANDAGES/DRESSINGS) ×4 IMPLANT
DRILL BIT CANN (BIT) ×6 IMPLANT
DRSG MEPILEX BORDER 4X4 (GAUZE/BANDAGES/DRESSINGS) ×12 IMPLANT
DRSG MEPITEL 4X7.2 (GAUZE/BANDAGES/DRESSINGS) ×12 IMPLANT
DRSG PAD ABDOMINAL 8X10 ST (GAUZE/BANDAGES/DRESSINGS) ×12 IMPLANT
DRSG PREVENA PLUS CUSTOM (GAUZE/BANDAGES/DRESSINGS) ×6
DURAPREP 26ML APPLICATOR (WOUND CARE) IMPLANT
ELECT REM PT RETURN 9FT ADLT (ELECTROSURGICAL) ×6
ELECTRODE REM PT RTRN 9FT ADLT (ELECTROSURGICAL) ×4 IMPLANT
GAUZE SPONGE 4X4 12PLY STRL (GAUZE/BANDAGES/DRESSINGS) ×36 IMPLANT
GAUZE XEROFORM 5X9 LF (GAUZE/BANDAGES/DRESSINGS) IMPLANT
GLOVE BIO SURGEON STRL SZ7.5 (GLOVE) ×12 IMPLANT
GLOVE BIO SURGEON STRL SZ8 (GLOVE) ×12 IMPLANT
GLOVE BIOGEL PI IND STRL 7.5 (GLOVE) ×28 IMPLANT
GLOVE BIOGEL PI IND STRL 8 (GLOVE) ×8 IMPLANT
GLOVE BIOGEL PI INDICATOR 7.5 (GLOVE) ×14
GLOVE BIOGEL PI INDICATOR 8 (GLOVE) ×4
GLOVE SKINSENSE NS SZ7.5 (GLOVE) ×4
GLOVE SKINSENSE STRL SZ7.5 (GLOVE) ×8 IMPLANT
GOWN STRL REIN XL XLG (GOWN DISPOSABLE) IMPLANT
GOWN STRL REUS W/ TWL LRG LVL3 (GOWN DISPOSABLE) ×24 IMPLANT
GOWN STRL REUS W/ TWL XL LVL3 (GOWN DISPOSABLE) ×8 IMPLANT
GOWN STRL REUS W/TWL LRG LVL3 (GOWN DISPOSABLE) ×12
GOWN STRL REUS W/TWL XL LVL3 (GOWN DISPOSABLE) ×4
GUIDEPIN 2.8X300 THRD TIP OIC (PIN) ×12 IMPLANT
HANDPIECE INTERPULSE COAX TIP (DISPOSABLE) ×2
IMMOBILIZER KNEE 20 (SOFTGOODS) ×6
IMMOBILIZER KNEE 20 THIGH 36 (SOFTGOODS) ×4 IMPLANT
KIT BASIN OR (CUSTOM PROCEDURE TRAY) ×6 IMPLANT
KIT ROOM TURNOVER OR (KITS) ×6 IMPLANT
MANIFOLD NEPTUNE II (INSTRUMENTS) ×6 IMPLANT
NS IRRIG 1000ML POUR BTL (IV SOLUTION) ×6 IMPLANT
PACK GENERAL/GYN (CUSTOM PROCEDURE TRAY) ×6 IMPLANT
PACK ORTHO EXTREMITY (CUSTOM PROCEDURE TRAY) IMPLANT
PAD ABD 8X10 STRL (GAUZE/BANDAGES/DRESSINGS) ×18 IMPLANT
PAD ARMBOARD 7.5X6 YLW CONV (MISCELLANEOUS) ×12 IMPLANT
PADDING CAST ABS 4INX4YD NS (CAST SUPPLIES)
PADDING CAST ABS COTTON 4X4 ST (CAST SUPPLIES) IMPLANT
PADDING CAST COTTON 6X4 STRL (CAST SUPPLIES) ×6 IMPLANT
PILLOW ABDUCTION HIP (SOFTGOODS) IMPLANT
PIN HALF 5X200X85MM EXFIX (EXFIX) ×6 IMPLANT
SCREW CANN 7.3X125 32MM THRD (Screw) ×6 IMPLANT
SCREW CANN 7.3X145 32MM THRD (Screw) ×6 IMPLANT
SET HNDPC FAN SPRY TIP SCT (DISPOSABLE) ×4 IMPLANT
SHEATH PINNACLE 5FR (SHEATH) ×6 IMPLANT
SPONGE LAP 18X18 X RAY DECT (DISPOSABLE) ×6 IMPLANT
STAPLER VISISTAT 35W (STAPLE) ×6 IMPLANT
STOCKINETTE IMPERVIOUS 9X36 MD (GAUZE/BANDAGES/DRESSINGS) IMPLANT
STOCKINETTE IMPERVIOUS LG (DRAPES) ×12 IMPLANT
SUT ETHILON 2 0 FS 18 (SUTURE) IMPLANT
SUT ETHILON 2 0 PSLX (SUTURE) ×12 IMPLANT
SUT ETHILON 3 0 PS 1 (SUTURE) ×6 IMPLANT
SUT PDS AB 2-0 CT1 27 (SUTURE) ×12 IMPLANT
SUT VIC AB 2-0 FS1 27 (SUTURE) ×12 IMPLANT
SWAB CULTURE ESWAB REG 1ML (MISCELLANEOUS) IMPLANT
TOWEL OR 17X24 6PK STRL BLUE (TOWEL DISPOSABLE) ×12 IMPLANT
TOWEL OR 17X26 10 PK STRL BLUE (TOWEL DISPOSABLE) ×12 IMPLANT
TRAY NEURO RADIOLOGY (CUSTOM PROCEDURE TRAY) ×6 IMPLANT
TUBE CONNECTING 12'X1/4 (SUCTIONS) ×1
TUBE CONNECTING 12X1/4 (SUCTIONS) ×5 IMPLANT
TUBE FEEDING ENTERAL 5FR 16IN (TUBING) IMPLANT
TUBING CYSTO DISP (UROLOGICAL SUPPLIES) IMPLANT
UNDERPAD 30X30 (UNDERPADS AND DIAPERS) ×24 IMPLANT
WASHER OIC 13MM 6 PACK (Screw) ×12 IMPLANT
YANKAUER SUCT BULB TIP NO VENT (SUCTIONS) ×6 IMPLANT

## 2016-11-28 SURGICAL SUPPLY — 62 items
BLADE CLIPPER SURG (BLADE) ×7 IMPLANT
CANISTER SUCT 3000ML PPV (MISCELLANEOUS) ×7 IMPLANT
CATH ANGIO OM CVD 65X5FR (CATHETERS) ×5 IMPLANT
CATH C2 65CM (CATHETERS) ×7 IMPLANT
CATH OMNI FLUSH (CATHETERS) ×2
CHLORAPREP W/TINT 26ML (MISCELLANEOUS) IMPLANT
COVER BACK TABLE 60X90IN (DRAPES) ×7 IMPLANT
COVER DOME SNAP 22 D (MISCELLANEOUS) ×7 IMPLANT
COVER SURGICAL LIGHT HANDLE (MISCELLANEOUS) ×7 IMPLANT
DEVICE VAS CLOSURE EXOSEAL 5F (Vascular Products) ×7 IMPLANT
DRAPE KOVER PROBE W/GEL 5X48 (DRAPES) ×7 IMPLANT
DRAPE LAPAROSCOPIC ABDOMINAL (DRAPES) ×7 IMPLANT
DRAPE WARM FLUID 44X44 (DRAPE) ×7 IMPLANT
DRSG OPSITE POSTOP 4X10 (GAUZE/BANDAGES/DRESSINGS) IMPLANT
DRSG OPSITE POSTOP 4X8 (GAUZE/BANDAGES/DRESSINGS) IMPLANT
DRSG TEGADERM 4X4.75 (GAUZE/BANDAGES/DRESSINGS) ×7 IMPLANT
ELECT BLADE 4.0 EZ CLEAN MEGAD (MISCELLANEOUS) ×7
ELECT BLADE 6.5 EXT (BLADE) IMPLANT
ELECT CAUTERY BLADE 6.4 (BLADE) ×7 IMPLANT
ELECT REM PT RETURN 9FT ADLT (ELECTROSURGICAL) ×7
ELECTRODE BLDE 4.0 EZ CLN MEGD (MISCELLANEOUS) ×5 IMPLANT
ELECTRODE REM PT RTRN 9FT ADLT (ELECTROSURGICAL) ×5 IMPLANT
GAUZE SPONGE 4X4 12PLY STRL (GAUZE/BANDAGES/DRESSINGS) ×7 IMPLANT
GLOVE BIOGEL PI IND STRL 7.5 (GLOVE) ×5 IMPLANT
GLOVE BIOGEL PI IND STRL 8 (GLOVE) ×15 IMPLANT
GLOVE BIOGEL PI INDICATOR 7.5 (GLOVE) ×2
GLOVE BIOGEL PI INDICATOR 8 (GLOVE) ×6
GLOVE ECLIPSE 7.5 STRL STRAW (GLOVE) ×14 IMPLANT
GOWN STRL REUS W/ TWL LRG LVL3 (GOWN DISPOSABLE) ×10 IMPLANT
GOWN STRL REUS W/TWL LRG LVL3 (GOWN DISPOSABLE) ×4
GUIDEWIRE ANGLED .035X260CM (WIRE) ×14 IMPLANT
GUIDEWIRE BENTSON (WIRE) ×7 IMPLANT
HANDLE SUCTION POOLE (INSTRUMENTS) ×5 IMPLANT
KIT BASIN OR (CUSTOM PROCEDURE TRAY) ×7 IMPLANT
KIT ROOM TURNOVER OR (KITS) ×7 IMPLANT
LIGASURE IMPACT 36 18CM CVD LR (INSTRUMENTS) IMPLANT
MICROPUNCTURE 5FR ~~LOC~~-NT-U-SST (MISCELLANEOUS) ×7
NS IRRIG 1000ML POUR BTL (IV SOLUTION) ×14 IMPLANT
PACK GENERAL/GYN (CUSTOM PROCEDURE TRAY) ×7 IMPLANT
PAD ARMBOARD 7.5X6 YLW CONV (MISCELLANEOUS) ×7 IMPLANT
SEPRAFILM PROCEDURAL PACK 3X5 (MISCELLANEOUS) IMPLANT
SET MICROPNCTR 5FR ~~LOC~~-NT-U-SST (MISCELLANEOUS) ×5 IMPLANT
SHEATH PINNACLE 5FR (SHEATH) ×7 IMPLANT
SPECIMEN JAR LARGE (MISCELLANEOUS) IMPLANT
SPONGE LAP 18X18 X RAY DECT (DISPOSABLE) IMPLANT
STAPLER VISISTAT 35W (STAPLE) IMPLANT
STOPCOCK 3 WAY HIGH PRESSURE (MISCELLANEOUS) ×2
STOPCOCK 3WAY HIGH PRESSURE (MISCELLANEOUS) ×5 IMPLANT
SUCTION POOLE HANDLE (INSTRUMENTS) ×7
SUCTION POOLE TIP (SUCTIONS) ×7 IMPLANT
SUT NOVA 1 T20/GS 25DT (SUTURE) IMPLANT
SUT PDS AB 1 TP1 96 (SUTURE) IMPLANT
SUT SILK 2 0 SH CR/8 (SUTURE) IMPLANT
SUT SILK 2 0 TIES 10X30 (SUTURE) IMPLANT
SUT SILK 3 0 SH CR/8 (SUTURE) IMPLANT
SUT SILK 3 0 TIES 10X30 (SUTURE) IMPLANT
SYR MEDRAD MARK V 150ML (SYRINGE) ×7 IMPLANT
TOWEL OR 17X26 10 PK STRL BLUE (TOWEL DISPOSABLE) ×7 IMPLANT
TRAY FOLEY W/METER SILVER 16FR (SET/KITS/TRAYS/PACK) IMPLANT
TRAY NEURO RADIOLOGY (CUSTOM PROCEDURE TRAY) ×7 IMPLANT
TUBING HIGH PRESSURE 120CM (CONNECTOR) ×7 IMPLANT
YANKAUER SUCT BULB TIP NO VENT (SUCTIONS) IMPLANT

## 2016-11-28 NOTE — Anesthesia Preprocedure Evaluation (Signed)
Anesthesia Evaluation  Patient identified by MRN, date of birth, ID band Patient unresponsive    Reviewed: Patient's Chart, lab work & pertinent test results, Unable to perform ROS - Chart review only  Airway Mallampati: Intubated       Dental  (+) Teeth Intact   Pulmonary     + decreased breath sounds      Cardiovascular  Rhythm:Regular Rate:Normal     Neuro/Psych    GI/Hepatic   Endo/Other    Renal/GU      Musculoskeletal   Abdominal (+)  Abdomen: rigid.    Peds  Hematology   Anesthesia Other Findings   Reproductive/Obstetrics                             Anesthesia Physical Anesthesia Plan  ASA: IV and emergent  Anesthesia Plan: General   Post-op Pain Management:    Induction: Intravenous  PONV Risk Score and Plan:   Airway Management Planned: Oral ETT  Additional Equipment: Arterial line and CVP  Intra-op Plan:   Post-operative Plan: Post-operative intubation/ventilation  Informed Consent:   Plan Discussed with: CRNA and Anesthesiologist  Anesthesia Plan Comments:         Anesthesia Quick Evaluation

## 2016-11-28 NOTE — ED Notes (Signed)
Dr Lindie SpruceWyatt placing foley under radiograph assist

## 2016-11-28 NOTE — Transfer of Care (Signed)
Immediate Anesthesia Transfer of Care Note  Patient: Caleb ArthursJohn J Dawson  Procedure(s) Performed: Procedure(s): EXTERNAL FIXATION PELVIS (N/A) IRRIGATION AND DEBRIDEMENT EXTREMITY LEFT LOWER ANTERIOR LEG (Bilateral) SACROILIAC JOINT FUSION (Bilateral) APPLICATION OF WOUND VAC (Left)  Pt transferred to OR 16 for second procedure.

## 2016-11-28 NOTE — ED Notes (Addendum)
Emergency release, 1st unit [facility] started [id number]. Exp 07/19/201/, volume 253. Finished 1055.

## 2016-11-28 NOTE — ED Notes (Signed)
[facility] per paramedics. Pt alert, diaphoretic, and pale. Pt immediately upgraded to Level 1. EDP and Trauma MD to bedside.
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
## 2016-11-28 NOTE — ED Notes (Signed)
#   3 FFP started [id number], A-pos. Volume 267, exp date 12/05/2016. Finished at 1106.

## 2016-11-28 NOTE — Anesthesia Procedure Notes (Signed)
Arterial Line Insertion Performed by: Kipp BroodJOSLIN, DAVID, CRNA  Patient location: OR. Preanesthetic checklist: patient identified, IV checked, monitors and equipment checked and timeout performed radial was placed  Attempts: 1 Procedure performed without using ultrasound guided technique. Following insertion, dressing applied and Biopatch. Post procedure assessment: normal  Patient tolerated the procedure well with no immediate complications.

## 2016-11-28 NOTE — ED Notes (Signed)
Pt taken to scanner CT # 3.

## 2016-11-28 NOTE — Progress Notes (Signed)
Ortho tech aware of order to place R wrist splint

## 2016-11-28 NOTE — Anesthesia Procedure Notes (Signed)
Procedure Name: Intubation Date/Time: 11/28/2016 1:14 PM Performed by: Adonis HousekeeperNGELL, Geddy Boydstun M Pre-anesthesia Checklist: Patient identified, Emergency Drugs available, Suction available, Patient being monitored and Timeout performed Patient Re-evaluated:Patient Re-evaluated prior to induction Oxygen Delivery Method: Circle system utilized Preoxygenation: Pre-oxygenation with 100% oxygen Induction Type: IV induction, Rapid sequence and Cricoid Pressure applied Laryngoscope Size: Glidescope and 4 Grade View: Grade I Tube type: Oral Tube size: 7.5 mm Number of attempts: 1 Placement Confirmation: ETT inserted through vocal cords under direct vision,  positive ETCO2 and breath sounds checked- equal and bilateral Secured at: 22 cm Tube secured with: Tape Dental Injury: Teeth and Oropharynx as per pre-operative assessment

## 2016-11-28 NOTE — Anesthesia Procedure Notes (Addendum)
Central Venous Catheter Insertion Performed by: Kipp BroodJOSLIN, Honey Zakarian, anesthesiologist Start/End7/04/2017 12:40 PM, 11/28/2016 12:45 PM Patient location: OR. Preanesthetic checklist: patient identified, IV checked, site marked, risks and benefits discussed, surgical consent, monitors and equipment checked, pre-op evaluation and timeout performed Position: Trendelenburg Hand hygiene performed , maximum sterile barriers used  and Seldinger technique used Catheter size: 12 Fr Central line was placed.Triple lumen Procedure performed using ultrasound guided technique. Ultrasound Notes:anatomy identified, needle tip was noted to be adjacent to the nerve/plexus identified, no ultrasound evidence of intravascular and/or intraneural injection and image(s) printed for medical record Attempts: 2 Following insertion, line sutured, dressing applied and Biopatch. Post procedure assessment: blood return through all ports, free fluid flow and no air  Patient tolerated the procedure well with no immediate complications. Additional procedure comments: Attempted L .subclavian line insertion. Catheter inserted using selinger technique. Blood return through ports not adequate. Decision made to insert L. IJ central line..Marland Kitchen
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
## 2016-11-28 NOTE — Procedures (Signed)
Retrograde urethrogram and cystogram performed in the trauma resuscitation room with 120cc of cystograffin.  This study demonstrated no evidence of injury to the penile or prostatic urethra, and no evidence of bladder injury.  Foley catheter able to be passed and left in place.  Marta LamasJames O. Gae BonWyatt, III, MD, FACS [phone] Trauma Surgeon

## 2016-11-28 NOTE — ED Notes (Signed)
#   3 RBC emergency release blood started. [id number]. Volume 335, exp date 12/05/2016. Finished 1105.

## 2016-11-28 NOTE — ED Notes (Addendum)
Ist unit emergency release is infusing.[id number] O-neg, volume 335, exp date 12/05/2016. Finished at 1055.
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
## 2016-11-28 NOTE — ED Notes (Signed)
#   2 unit RBC infusing, emergency release, Unit # [id number]. Finished at 1101. O-neg, Volume 335.

## 2016-11-28 NOTE — Progress Notes (Signed)
Orthopedic Tech Progress Note Patient Details:  Toni ArthursJohn J Bracy [birth year] [id number]  Ortho Devices Type of Ortho Device: Volar splint Ortho Device/Splint Location: right hand/wrist  Right   pt unable to take instructions at this time.  Ortho Device/Splint Interventions: Application   [name], Shana Zavaleta C 11/28/2016, 6:21 PM
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
## 2016-11-28 NOTE — ED Notes (Signed)
#   2 Unit FFP infusing [id number] A-neg. 11/28/2016 exp date. Volume 275. Finished 1100.

## 2016-11-28 NOTE — ED Notes (Signed)
Transported to OR with RN x2/NT/PA/MD

## 2016-11-28 NOTE — Procedures (Signed)
Interventional Radiology Procedure Note  Procedure:   US guided left CFA access for aortogram, pelvic angiogram, left iliac/CFA angiogram.  Deployment of Exoseal for hemostasis  Findings:  Patent L CFA on US. Aortagram: Patent mesenteric vessels.  Pelvic angio: Steep bifurcation.  Patent iliac system L iliac artery: Presumed avulsion of right inferior epigastric artery, with stump measuring ~463mm at the origin.  The origin is low, overlying the femoral head.  No active extravasation or contrast pooling.    Complications: None  Intra-op decision making:   Intra-op discussion with Dr. Lindie SpruceWyatt, Trauma Surgery attending.  Options for endovascular repair of the proximal inf epigastric artery avulsion include embolization, or covered stent placement.  Concern for coil embolization as there is only stump that may not accept stable coil mass, with risk for distal non-target embolization to the right leg.  If covered stent, the distal margin would be in the CFA, and would be at risk for stent fracture/closure over time given the hip flexion.    Given the normal SBP at this time, we elected for watchful waiting, knowing that repeat angiogram may be needed.  Given the extra-peritoneal location of the hemorrhage, there is likely tamponade at this time.   Recommendations:  - Left leg straight x 6 hours now.  - Pressure dressing on the LCFA for now.  - Do not submerge for now.  - Routine wound care - Agree with current care - Will follow - Agree with serial H&H  Signed,  Yvone NeuJaime S. Loreta AveWagner, DO

## 2016-11-28 NOTE — Anesthesia Preprocedure Evaluation (Addendum)
Anesthesia Evaluation  Patient identified by MRN, date of birth, ID band Patient awake    Reviewed: Allergy & Precautions, NPO status , Patient's Chart, lab work & pertinent test results  Airway Mallampati: II  TM Distance: >3 FB Neck ROM: Full    Dental  (+) Teeth Intact, Dental Advisory Given   Pulmonary    breath sounds clear to auscultation       Cardiovascular  Rhythm:Regular Rate:Normal     Neuro/Psych    GI/Hepatic   Endo/Other    Renal/GU      Musculoskeletal   Abdominal   Peds  Hematology   Anesthesia Other Findings   Reproductive/Obstetrics                             Anesthesia Physical Anesthesia Plan  ASA: IV and emergent  Anesthesia Plan: General   Post-op Pain Management:    Induction: Intravenous  PONV Risk Score and Plan: Ondansetron and Dexamethasone  Airway Management Planned: Oral ETT  Additional Equipment: CVP and Arterial line  Intra-op Plan:   Post-operative Plan: Post-operative intubation/ventilation  Informed Consent: I have reviewed the patients History and Physical, chart, labs and discussed the procedure including the risks, benefits and alternatives for the proposed anesthesia with the patient or authorized representative who has indicated his/her understanding and acceptance.   Dental advisory given  Plan Discussed with: CRNA and Anesthesiologist  Anesthesia Plan Comments:        Anesthesia Quick Evaluation

## 2016-11-28 NOTE — ED Notes (Signed)
Returned from CT Scan
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
## 2016-11-28 NOTE — Transfer of Care (Signed)
Immediate Anesthesia Transfer of Care Note  Patient: Caleb ArthursJohn J Dawson  Procedure(s) Performed: Procedure(s): RIGHT ILIAC ANGIOGRAM (Right) ULTRASOUND GUIDANCE FOR VASCULAR ACCESS, left iliac artery (Left) PELVIC ANGIOGRAPHY (N/A) ABDOMINAL AORTAGRAM (N/A)  Patient Location: icu  Anesthesia Type:General  Level of Consciousness: sedated and Patient remains intubated per anesthesia plan  Airway & Oxygen Therapy: Patient remains intubated per anesthesia plan and Patient placed on Ventilator (see vital sign flow sheet for setting)  Post-op Assessment: Report given to RN and Post -op Vital signs reviewed and stable  Post vital signs: Reviewed and stable  Last Vitals:  Vitals:   11/28/16 1133 11/28/16 1202  BP: 117/67 (!) 96/53  Pulse: 65 62  Resp:  18    Last Pain:  Vitals:   11/28/16 1100  PainSc: 10-Worst pain ever         Complications: No apparent anesthesia complications

## 2016-11-29 ENCOUNTER — Inpatient Hospital Stay (HOSPITAL_COMMUNITY): Payer: BLUE CROSS/BLUE SHIELD

## 2016-11-29 ENCOUNTER — Encounter (HOSPITAL_COMMUNITY): Payer: Self-pay | Admitting: Orthopedic Surgery

## 2016-11-29 LAB — POCT I-STAT 3, ART BLOOD GAS (G3+)
Acid-Base Excess: 1 mmol/L (ref 0.0–2.0)
BICARBONATE: 24.9 mmol/L (ref 20.0–28.0)
O2 SAT: 98 %
PCO2 ART: 38.6 mmHg (ref 32.0–48.0)
PO2 ART: 111 mmHg — AB (ref 83.0–108.0)
Patient temperature: 99.9
TCO2: 26 mmol/L (ref 0–100)
pH, Arterial: 7.42 (ref 7.350–7.450)

## 2016-11-29 LAB — BPAM FFP
BLOOD PRODUCT EXPIRATION DATE: 201807152359
BLOOD PRODUCT EXPIRATION DATE: 201807172359
BLOOD PRODUCT EXPIRATION DATE: 201807192359
BLOOD PRODUCT EXPIRATION DATE: 201807192359
BLOOD PRODUCT EXPIRATION DATE: 201808052359
Blood Product Expiration Date: 201807122359
Blood Product Expiration Date: 201807152359
Blood Product Expiration Date: 201807172359
Blood Product Expiration Date: 201807172359
Blood Product Expiration Date: 201807172359
Blood Product Expiration Date: 201807192359
Blood Product Expiration Date: 201807192359
ISSUE DATE / TIME: 201807121037
ISSUE DATE / TIME: 201807121056
ISSUE DATE / TIME: 201807121205
ISSUE DATE / TIME: 201807121205
ISSUE DATE / TIME: 201807121424
ISSUE DATE / TIME: 201807121424
ISSUE DATE / TIME: 201807121037
ISSUE DATE / TIME: 201807121056
ISSUE DATE / TIME: 201807121205
ISSUE DATE / TIME: 201807121205
ISSUE DATE / TIME: 201807121424
ISSUE DATE / TIME: 201807121424
UNIT TYPE AND RH: 600
UNIT TYPE AND RH: 600
Unit Type and Rh: 600
Unit Type and Rh: 600
Unit Type and Rh: 6200
Unit Type and Rh: 6200
Unit Type and Rh: 6200
Unit Type and Rh: 6200
Unit Type and Rh: 6200
Unit Type and Rh: 6200
Unit Type and Rh: 6200
Unit Type and Rh: 6200

## 2016-11-29 LAB — COMPREHENSIVE METABOLIC PANEL
ALBUMIN: 2.6 g/dL — AB (ref 3.5–5.0)
ALT: 29 U/L (ref 17–63)
ANION GAP: 5 (ref 5–15)
AST: 77 U/L — AB (ref 15–41)
Alkaline Phosphatase: 38 U/L (ref 38–126)
BILIRUBIN TOTAL: 1.6 mg/dL — AB (ref 0.3–1.2)
BUN: 17 mg/dL (ref 6–20)
CO2: 23 mmol/L (ref 22–32)
Calcium: 7.1 mg/dL — ABNORMAL LOW (ref 8.9–10.3)
Chloride: 113 mmol/L — ABNORMAL HIGH (ref 101–111)
Creatinine, Ser: 1.18 mg/dL (ref 0.61–1.24)
GFR calc Af Amer: >60 mL/min (ref >60)
GFR calc non Af Amer: >60 mL/min (ref >60)
GLUCOSE: 111 mg/dL — AB (ref 65–99)
POTASSIUM: 3.7 mmol/L (ref 3.5–5.1)
SODIUM: 141 mmol/L (ref 135–145)
Total Protein: 4.4 g/dL — ABNORMAL LOW (ref 6.5–8.1)

## 2016-11-29 LAB — BPAM PLATELET PHERESIS
BLOOD PRODUCT EXPIRATION DATE: 201807122359
BLOOD PRODUCT EXPIRATION DATE: 201807142359
BLOOD PRODUCT EXPIRATION DATE: 201807142359
Blood Product Expiration Date: 201807131535
ISSUE DATE / TIME: 201807121536
ISSUE DATE / TIME: 201807121453
ISSUE DATE / TIME: 201807121453
ISSUE DATE / TIME: 201807121536
UNIT TYPE AND RH: 5100
UNIT TYPE AND RH: 6200
Unit Type and Rh: 6200
Unit Type and Rh: 7300

## 2016-11-29 LAB — CBC
HCT: 24.7 % — ABNORMAL LOW (ref 39.0–52.0)
HCT: 23.7 % — ABNORMAL LOW (ref 39.0–52.0)
HEMATOCRIT: 24.2 % — AB (ref 39.0–52.0)
HEMATOCRIT: 23.7 % — AB (ref 39.0–52.0)
HEMOGLOBIN: 8 g/dL — AB (ref 13.0–17.0)
Hemoglobin: 8.5 g/dL — ABNORMAL LOW (ref 13.0–17.0)
Hemoglobin: 8.5 g/dL — ABNORMAL LOW (ref 13.0–17.0)
Hemoglobin: 8.4 g/dL — ABNORMAL LOW (ref 13.0–17.0)
MCH: 28.2 pg (ref 26.0–34.0)
MCH: 28.6 pg (ref 26.0–34.0)
MCH: 29.2 pg (ref 26.0–34.0)
MCH: 28 pg (ref 26.0–34.0)
MCHC: 34.4 g/dL (ref 30.0–36.0)
MCHC: 35.1 g/dL (ref 30.0–36.0)
MCHC: 35.4 g/dL (ref 30.0–36.0)
MCHC: 33.8 g/dL (ref 30.0–36.0)
MCV: 82.1 fL (ref 78.0–100.0)
MCV: 81.5 fL (ref 78.0–100.0)
MCV: 82.3 fL (ref 78.0–100.0)
MCV: 82.9 fL (ref 78.0–100.0)
PLATELETS: 124 10*3/uL — AB (ref 150–400)
PLATELETS: 110 10*3/uL — AB (ref 150–400)
PLATELETS: 125 10*3/uL — AB (ref 150–400)
Platelets: 111 10*3/uL — ABNORMAL LOW (ref 150–400)
RBC: 3.01 MIL/uL — ABNORMAL LOW (ref 4.22–5.81)
RBC: 2.97 MIL/uL — ABNORMAL LOW (ref 4.22–5.81)
RBC: 2.88 MIL/uL — ABNORMAL LOW (ref 4.22–5.81)
RBC: 2.86 MIL/uL — AB (ref 4.22–5.81)
RDW: 15.8 % — AB (ref 11.5–15.5)
RDW: 16.5 % — AB (ref 11.5–15.5)
RDW: 16.6 % — AB (ref 11.5–15.5)
RDW: 16.6 % — ABNORMAL HIGH (ref 11.5–15.5)
WBC: 4.9 10*3/uL (ref 4.0–10.5)
WBC: 5.9 10*3/uL (ref 4.0–10.5)
WBC: 7 10*3/uL (ref 4.0–10.5)
WBC: 6.9 10*3/uL (ref 4.0–10.5)

## 2016-11-29 LAB — PREPARE FRESH FROZEN PLASMA
UNIT DIVISION: 0
UNIT DIVISION: 0
UNIT DIVISION: 0
UNIT DIVISION: 0
UNIT DIVISION: 0
Unit division: 0
Unit division: 0
Unit division: 0
Unit division: 0
Unit division: 0
Unit division: 0
Unit division: 0

## 2016-11-29 LAB — BPAM CRYOPRECIPITATE
Blood Product Expiration Date: 201807121952
Blood Product Expiration Date: 201807121952
Blood Product Expiration Date: 201807122041
Blood Product Expiration Date: 201807122041
ISSUE DATE / TIME: 201807121431
ISSUE DATE / TIME: 201807121431
ISSUE DATE / TIME: 201807121500
ISSUE DATE / TIME: 201807121500
Unit Type and Rh: 6200
Unit Type and Rh: 6200
Unit Type and Rh: 6200
Unit Type and Rh: 6200

## 2016-11-29 LAB — PREPARE PLATELET PHERESIS
UNIT DIVISION: 0
UNIT DIVISION: 0
Unit division: 0
Unit division: 0

## 2016-11-29 LAB — POCT I-STAT 4, (NA,K, GLUC, HGB,HCT)
Glucose, Bld: 150 mg/dL — ABNORMAL HIGH (ref 65–99)
HEMATOCRIT: 23 % — AB (ref 39.0–52.0)
Hemoglobin: 7.8 g/dL — ABNORMAL LOW (ref 13.0–17.0)
POTASSIUM: 4.2 mmol/L (ref 3.5–5.1)
SODIUM: 144 mmol/L (ref 135–145)

## 2016-11-29 LAB — POCT I-STAT 7, (LYTES, BLD GAS, ICA,H+H)
Acid-base deficit: 5 mmol/L — ABNORMAL HIGH (ref 0.0–2.0)
Bicarbonate: 21.7 mmol/L (ref 20.0–28.0)
Calcium, Ion: 0.78 mmol/L — CL (ref 1.15–1.40)
HEMATOCRIT: 23 % — AB (ref 39.0–52.0)
HEMOGLOBIN: 7.8 g/dL — AB (ref 13.0–17.0)
O2 SAT: 100 %
PCO2 ART: 39.2 mmHg (ref 32.0–48.0)
POTASSIUM: 4.2 mmol/L (ref 3.5–5.1)
Sodium: 144 mmol/L (ref 135–145)
TCO2: 23 mmol/L (ref 0–100)
pH, Arterial: 7.333 — ABNORMAL LOW (ref 7.350–7.450)
pO2, Arterial: 354 mmHg — ABNORMAL HIGH (ref 83.0–108.0)

## 2016-11-29 LAB — HIV ANTIBODY (ROUTINE TESTING W REFLEX): HIV Screen 4th Generation wRfx: NONREACTIVE

## 2016-11-29 LAB — PREPARE CRYOPRECIPITATE
UNIT DIVISION: 0
UNIT DIVISION: 0
Unit division: 0
Unit division: 0

## 2016-11-29 LAB — BLOOD PRODUCT ORDER (VERBAL) VERIFICATION

## 2016-11-29 LAB — PROTIME-INR
INR: 1.3
Prothrombin Time: 16.3 s — ABNORMAL HIGH (ref 11.4–15.2)

## 2016-11-29 LAB — LACTIC ACID, PLASMA
LACTIC ACID, VENOUS: 2.1 mmol/L — AB (ref 0.5–1.9)
Lactic Acid, Venous: 1.4 mmol/L (ref 0.5–1.9)

## 2016-11-29 MED ORDER — FUROSEMIDE 10 MG/ML IJ SOLN
40.0000 mg | Freq: Once | INTRAMUSCULAR | Status: AC
Start: 2016-11-29 — End: 2016-11-29
  Administered 2016-11-29: 40 mg via INTRAVENOUS
  Filled 2016-11-29: qty 4

## 2016-11-29 MED ORDER — PIVOT 1.5 CAL PO LIQD
1000.0000 mL | ORAL | Status: DC
  Administered 2016-11-29 – 2016-12-01 (×3): 1000 mL
  Filled 2016-11-29 (×5): qty 1000

## 2016-11-29 MED ORDER — DOCUSATE SODIUM 50 MG/5ML PO LIQD
100.0000 mg | Freq: Two times a day (BID) | ORAL | Status: DC
  Administered 2016-11-29 – 2016-12-05 (×11): 100 mg via ORAL
  Filled 2016-11-29 (×11): qty 10

## 2016-11-29 MED ORDER — SODIUM CHLORIDE 0.9 % IV SOLN: 1000.0000 mg | Freq: Once | INTRAVENOUS | Status: DC

## 2016-11-29 MED ORDER — CEFAZOLIN SODIUM-DEXTROSE 2-3 GM-% IV SOLR
INTRAVENOUS | Status: DC | PRN
  Administered 2016-11-28: 2 g via INTRAVENOUS
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
## 2016-11-29 NOTE — Addendum Note (Signed)
Addendum  created 11/29/16 1131 by Adair LaundryPaxton, Boyce Keltner A, CRNA   Anesthesia Intra Meds edited
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
## 2016-11-29 NOTE — Progress Notes (Signed)
Subjective: 1 Day Post-Op Procedure(s) (LRB): RIGHT ILIAC ANGIOGRAM (Right) ULTRASOUND GUIDANCE FOR VASCULAR ACCESS, left iliac artery CPT [phone] (Left) PELVIC ANGIOGRAPHY CPT 75741 (N/A) Abdominal Aortagram CPT [phone] (N/A)  S/p closed reduction right cmc dislocations.  Intubated/sedated.  Objective: Vital signs in last 24 hours: Temp:  [93.6 F (34.2 C)-101.1 F (38.4 C)] 98.5 F (36.9 C) (07/13 1200) Pulse Rate:  [57-85] 66 (07/13 0800) Resp:  [9-24] 21 (07/13 1200) BP: (94-190)/(56-109) 95/56 (07/13 1200) SpO2:  [87 %-100 %] 100 % (07/13 1118) Arterial Line BP: (93-265)/(49-116) 125/57 (07/13 1200) FiO2 (%):  [40 %-60 %] 40 % (07/13 1118) Weight:  [81.6 kg (179 lb 14.3 oz)] 81.6 kg (179 lb 14.3 oz) (07/13 0800)  Intake/Output from previous day: 07/12 0701 - 07/13 0700 In: 18074.2 [I.V.:7046.2; UJWJX:9147Blood:7268; IV Piggyback:1960] Out: 1575 [Urine:1375; Blood:200] Intake/Output this shift: Total I/O In: 620.6 [I.V.:620.6] Out: 2025 [Urine:2025]   Recent Labs  11/28/16 1637 11/28/16 1642 11/28/16 1713 11/29/16 0349 11/29/16 1122  HGB 7.8* 7.8* 9.9* 8.5* 8.5*    Recent Labs  11/29/16 0349 11/29/16 1122  WBC 4.9 5.9  RBC 3.01* 2.97*  HCT 24.7* 24.2*  PLT 124* 111*    Recent Labs  11/28/16 1713 11/29/16 0349  NA 139 141  K 4.4 3.7  CL 109 113*  CO2 20* 23  BUN 12 17  CREATININE 1.01 1.18  GLUCOSE 152* 111*  CALCIUM 7.7* 7.1*    Recent Labs  11/28/16 1713 11/29/16 0349  INR 1.32 1.30    intact capillary refill right hand, but slower than opposite hand.  hand swollen but soft.  thenar and hypothenar compartments soft.  dorsum of hand and between [facility] soft.  able to range digits without pain response.  Assessment/Plan: 1 Day Post-Op Procedure(s) (LRB): RIGHT ILIAC ANGIOGRAM (Right) ULTRASOUND GUIDANCE FOR VASCULAR ACCESS, left iliac artery CPT [phone] (Left) PELVIC ANGIOGRAPHY CPT 75741 (N/A) Abdominal Aortagram CPT [phone] (N/A) Will need fixation when  stable enough to tolerate surgery.  Discussed with Dr. Lindie SpruceWyatt and probably will be stable next week some time.  Will follow.  Caleb Dawson R 11/29/2016, 1:44 PM
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
## 2016-11-29 NOTE — Anesthesia Postprocedure Evaluation (Signed)
Anesthesia Post Note  Patient: Toni ArthursJohn J Bayliss  Procedure(s) Performed: Procedure(s) (LRB): RIGHT ILIAC ANGIOGRAM (Right) ULTRASOUND GUIDANCE FOR VASCULAR ACCESS, left iliac artery (Left) PELVIC ANGIOGRAPHY (N/A) ABDOMINAL AORTAGRAM (N/A)     Patient location during evaluation: PACU Anesthesia Type: General Level of consciousness: sedated and patient remains intubated per anesthesia plan Pain management: pain level controlled Vital Signs Assessment: post-procedure vital signs reviewed and stable Respiratory status: spontaneous breathing, nonlabored ventilation and respiratory function stable Cardiovascular status: blood pressure returned to baseline Anesthetic complications: no    Last Vitals:  Vitals:   11/29/16 0750 11/29/16 0800  BP:  101/65  Pulse: 66 66  Resp: (!) 24 (!) 23  Temp: 37.3 C     Last Pain:  Vitals:   11/29/16 0750  TempSrc: Axillary  PainSc:                  Leiliana Foody COKER
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
## 2016-11-29 NOTE — Progress Notes (Addendum)
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 Range   pH, Arterial 7.420 7.350 - 7.450   pCO2 arterial 38.6 32.0 - 48.0 mmHg   pO2, Arterial 111.0 (H) 83.0 - 108.0 mmHg   Bicarbonate 24.9 20.0 - 28.0 mmol/L   TCO2 26 0 - 100 mmol/L   O2 Saturation 98.0 %   Acid-Base Excess 1.0 0.0 - 2.0 mmol/L   Patient temperature 99.9 F    Collection site RADIAL, ALLEN'S TEST ACCEPTABLE    Drawn by RT    Sample type ARTERIAL      Assessment/Plan:  NEURO  Altered Mental Status:  sedation   Plan: Will perform a wake up assessment last night.  PULM  Atelectasis/collapse (focal and left side)   Plan: No specific treatment.  May wean on the ventilator if tolerated.  CARDIO  Valve Replacement (mitral) >= 7 days No acute cardiothoracic problems   Plan: Anticoagulation needs to be resumed as  soon as possible.  Not now though.  Possible start on heparin once bleeding is controlled--maybe Sunday or Monday.  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  CVP is high and we may be able to diurese the patient   Plan: Lasix 40mg  IV x 1  GI  Abdominal wall tightness, but excellent bowel sounds   Plan: Tube feeding at 20  ID  No known infectious problems.   Plan: CPM  HEME  Anemia acute blood loss anemia) Thrombocytopenia (consumptive)   Plan: No blood products needed at this time.  PT/INR, 16.3/1.3  ENDO No known issues   Plan: CPM  Global Issues  Patient is remarkably stable.  Will perform a wake up assessment and try to wean vent if possible.  Will not extubate.  Serial CBCs.    LOS: 1 day   Additional comments:I reviewed the patient's new clinical lab test results. cbc/bmet and I reviewed the patients new imaging test results. cxr is pending.  Critical Care Total Time*: 30 Minutes  Wyett Narine 11/29/2016  *Care during the described time interval was provided by me and/or other providers on the critical care team.  I have reviewed this patient's available data, including medical history, events of note, physical examination and test results as part of my evaluation.

## 2016-11-29 NOTE — Anesthesia Postprocedure Evaluation (Signed)
Anesthesia Post Note  Patient: Toni ArthursJohn J Puerta  Procedure(s) Performed: Procedure(s) (LRB): EXTERNAL FIXATION PELVIS (N/A) IRRIGATION AND DEBRIDEMENT EXTREMITY LOWER ANTERIOR LEGs (Bilateral) SACROILIAC JOINT FUSION (Bilateral) APPLICATION OF WOUND VAC (Left)     Patient location during evaluation: NICU Anesthesia Type: General Level of consciousness: patient remains intubated per anesthesia plan and sedated Pain management: pain level controlled Vital Signs Assessment: post-procedure vital signs reviewed and stable Respiratory status: patient remains intubated per anesthesia plan and patient on ventilator - see flowsheet for VS Cardiovascular status: blood pressure returned to baseline Anesthetic complications: no    Last Vitals:  Vitals:   11/29/16 0750 11/29/16 0800  BP:  101/65  Pulse: 66 66  Resp: (!) 24 (!) 23  Temp: 37.3 C     Last Pain:  Vitals:   11/29/16 0750  TempSrc: Axillary  PainSc:                  Etai Copado COKER
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
## 2016-11-29 NOTE — Anesthesia Postprocedure Evaluation (Signed)
Anesthesia Post Note  [name]  Procedure(s) Performed: Procedure(s) (LRB): RIGHT ILIAC ANGIOGRAM (Right) ULTRASOUND GUIDANCE FOR VASCULAR ACCESS, left iliac artery (Left) PELVIC ANGIOGRAPHY (N/A) ABDOMINAL AORTAGRAM (N/A)     Patient location during evaluation: NICU Anesthesia Type: General Level of consciousness: sedated and patient remains intubated per anesthesia plan Vital Signs Assessment: post-procedure vital signs reviewed and stable Respiratory status: patient on ventilator - see flowsheet for VS and patient remains intubated per anesthesia plan Cardiovascular status: blood pressure returned to baseline Anesthetic complications: no    Last Vitals:  Vitals:   11/29/16 0700 11/29/16 0750  BP: 94/60   Pulse: 64 66  Resp: (!) 21 (!) 24  Temp:  37.3 C    Last Pain:  Vitals:   11/29/16 0750  TempSrc: Axillary  PainSc:                  Caleb Dawson

## 2016-11-30 ENCOUNTER — Inpatient Hospital Stay (HOSPITAL_COMMUNITY): Payer: BLUE CROSS/BLUE SHIELD

## 2016-11-30 LAB — BASIC METABOLIC PANEL
Anion gap: 6 (ref 5–15)
BUN: 16 mg/dL (ref 6–20)
CO2: 24 mmol/L (ref 22–32)
Calcium: 7.2 mg/dL — ABNORMAL LOW (ref 8.9–10.3)
Chloride: 111 mmol/L (ref 101–111)
Creatinine, Ser: 1.03 mg/dL (ref 0.61–1.24)
GFR calc non Af Amer: >60 mL/min (ref >60)
GLUCOSE: 120 mg/dL — AB (ref 65–99)
POTASSIUM: 3.3 mmol/L — AB (ref 3.5–5.1)
Sodium: 141 mmol/L (ref 135–145)

## 2016-11-30 LAB — CBC WITH DIFFERENTIAL/PLATELET
BASOS ABS: 0 10*3/uL (ref 0.0–0.1)
BASOS PCT: 0 %
Eosinophils Absolute: 0.4 10*3/uL (ref 0.0–0.7)
Eosinophils Relative: 6 %
HEMATOCRIT: 22.6 % — AB (ref 39.0–52.0)
Hemoglobin: 7.8 g/dL — ABNORMAL LOW (ref 13.0–17.0)
LYMPHS PCT: 11 %
Lymphs Abs: 0.8 10*3/uL (ref 0.7–4.0)
MCH: 28.9 pg (ref 26.0–34.0)
MCHC: 34.5 g/dL (ref 30.0–36.0)
MCV: 83.7 fL (ref 78.0–100.0)
Monocytes Absolute: 0.6 10*3/uL (ref 0.1–1.0)
Monocytes Relative: 8 %
NEUTROS ABS: 5.3 10*3/uL (ref 1.7–7.7)
Neutrophils Relative %: 75 %
PLATELETS: 117 10*3/uL — AB (ref 150–400)
RBC: 2.7 MIL/uL — AB (ref 4.22–5.81)
RDW: 16.7 % — ABNORMAL HIGH (ref 11.5–15.5)
WBC: 7 10*3/uL (ref 4.0–10.5)

## 2016-11-30 LAB — PROTIME-INR
INR: 1.38
PROTHROMBIN TIME: 17.1 s — AB (ref 11.4–15.2)

## 2016-11-30 NOTE — Progress Notes (Signed)
Follow up - Trauma and Critical Care  Patient Details:    Caleb Dawson is an 66 y.o. male.  Lines/tubes : Airway 7.5 mm (Active)  Secured at (cm) 24 cm 11/30/2016  7:11 AM  Measured From Lips 11/30/2016  8:00 AM  Secured Location Center 11/30/2016  8:00 AM  Secured By [employer] Tube Holder 11/30/2016  8:00 AM  Tube Holder Repositioned Yes 11/30/2016  8:00 AM  Cuff Pressure (cm H2O) 24 cm H2O 11/30/2016  3:30 AM  Site Condition Dry 11/30/2016  8:00 AM     CVC Triple Lumen 11/28/16 Left Internal jugular (Active)  Indication for Insertion or Continuance of Line Vasoactive infusions;Prolonged intravenous therapies 11/29/2016  8:00 PM  Site Assessment Clean;Dry;Intact 11/29/2016  8:00 PM  Proximal Lumen Status Infusing 11/29/2016  8:00 PM  Medial Lumen Status Infusing 11/29/2016  8:00 PM  Distal Lumen Status Other (Comment) 11/29/2016  8:00 PM  Dressing Type Transparent;Occlusive 11/29/2016  8:00 PM  Dressing Status Intact;Antimicrobial disc in place 11/29/2016  8:00 PM  Line Care Connections checked and tightened 11/29/2016  8:00 PM  Dressing Intervention New dressing 11/28/2016  5:00 PM  Dressing Change Due 12/05/16 11/29/2016  8:00 PM     Arterial Line 11/28/16 Radial (Active)  Site Assessment Clean;Dry;Intact 11/29/2016  8:00 PM  Line Status Pulsatile blood flow 11/29/2016  8:00 PM  Art Line Waveform Appropriate 11/29/2016  8:00 PM  Art Line Interventions Zeroed and calibrated;Connections checked and tightened 11/28/2016  8:00 PM  Color/Movement/Sensation Capillary refill less than 3 sec 11/29/2016  8:00 PM  Dressing Type Transparent;Occlusive 11/29/2016  8:00 PM  Dressing Status Clean;Dry;Intact 11/29/2016  8:00 PM  Dressing Change Due 12/05/16 11/29/2016  8:00 PM     Negative Pressure Wound Therapy Leg Left;Anterior;Lower (Active)  Last dressing change 11/28/16 11/29/2016  8:00 AM  Site / Wound Assessment Dressing in place / Unable to assess 11/30/2016  8:00 AM  Cycle Continuous;On 11/30/2016  8:00  AM  Target Pressure (mmHg) 125 11/30/2016  8:00 AM  Dressing Status Intact 11/30/2016  8:00 AM  Drainage Amount None 11/30/2016  8:00 AM  Output (mL) 0 mL 11/28/2016  7:00 PM     Urethral Catheter Dr. Lindie SpruceWyatt (Active)  Indication for Insertion or Continuance of Catheter Unstable critical patients (first 24-48 hours) 11/30/2016  8:00 AM  Site Assessment Clean;Intact 11/30/2016  8:00 AM  Catheter Maintenance Bag below level of bladder;Catheter secured;Drainage bag/tubing not touching floor;Insertion date on drainage bag;No dependent loops;Seal intact 11/30/2016  8:00 AM  Collection Container Standard drainage bag 11/30/2016  8:00 AM  Securement Method Securing device (Describe) 11/30/2016  8:00 AM  Urinary Catheter Interventions Unclamped 11/30/2016  8:00 AM  Output (mL) 350 mL 11/30/2016  6:00 AM    Microbiology/Sepsis markers: Results for orders placed or performed during the hospital encounter of 11/28/16  MRSA PCR Screening     Status: None   Collection Time: 11/28/16  5:01 PM  Result Value Ref Range Status   MRSA by PCR NEGATIVE NEGATIVE Final    Comment:        The GeneXpert MRSA Assay (FDA approved for NASAL specimens only), is one component of a comprehensive MRSA colonization surveillance program. It is not intended to diagnose MRSA infection nor to guide or monitor treatment for MRSA infections.     Anti-infectives:  Anti-infectives    Start     Dose/Rate Route Frequency Ordered Stop   11/28/16 2200  ceFAZolin (ANCEF) IVPB 1 g/50 mL premix  1 g 100 mL/hr over 30 Minutes Intravenous Every 8 hours 11/28/16 1707 11/30/16 2159   11/28/16 1715  ceFAZolin (ANCEF) IVPB 2g/100 mL premix  Status:  Discontinued     2 g 200 mL/hr over 30 Minutes Intravenous Every 8 hours 11/28/16 1707 11/28/16 1726      Best Practice/Protocols:  VTE Prophylaxis: Mechanical Continous Sedation  Consults: Treatment Team:  Myrene Galas, MD Betha Loa, MD    Events:  Subjective:     Overnight Issues: Did not tolerate weaning   Very stable otherwise  Scrotal swelling and ecchymosis noted   Objective:  Vital signs for last 24 hours: Temp:  [98.5 F (36.9 C)-99.2 F (37.3 C)] 98.9 F (37.2 C) (07/14 0731) Pulse Rate:  [53-71] 66 (07/14 0800) Resp:  [17-26] 17 (07/14 0800) BP: (95-108)/(53-61) 108/59 (07/14 0800) SpO2:  [96 %-100 %] 98 % (07/14 0800) Arterial Line BP: (97-144)/(43-72) 133/66 (07/14 0800) FiO2 (%):  [40 %] 40 % (07/14 0800)  Hemodynamic parameters for last 24 hours: CVP:  [11 mmHg-20 mmHg] 17 mmHg  Intake/Output from previous day: 07/13 0701 - 07/14 0700 In: 3076.8 [I.V.:2592.1; NG/GT:334.7; IV Piggyback:150] Out: 3300 [Urine:3300]  Intake/Output this shift: Total I/O In: 117.2 [I.V.:97.2; NG/GT:20] Out: -   Vent settings for last 24 hours: Vent Mode: CPAP;PSV FiO2 (%):  [40 %] 40 % Set Rate:  [16 bmp] 16 bmp Vt Set:  [550 mL] 550 mL PEEP:  [5 cmH20] 5 cmH20 Pressure Support:  [15 cmH20] 15 cmH20 Plateau Pressure:  [24 cmH20-27 cmH20] 24 cmH20  Physical Exam:  General: sedated on vent  Neuro: RASS -1 Resp: clear to auscultation bilaterally CVS: murmur and click noted  GI: tight but no rebound or guarding  BS present  ex fix in place  pin site draining clear   serous fluid  scrotal edema  and ecchymisis noted   Results for orders placed or performed during the hospital encounter of 11/28/16 (from the past 24 hour(s))  Provider-confirm verbal Blood Bank order - RBC, FFP, Type & Screen; 4 Units; Order taken: 11/28/2016; 10:39 AM; Level 1 Trauma, Emergency Release, STAT 2 units of O negative red cells and 2 units of A plasmas emergency released to the ER @ 1044. 2 ...     Status: None   Collection Time: 11/29/16 11:00 AM  Result Value Ref Range   Blood product order confirm MD AUTHORIZATION REQUESTED   CBC     Status: Abnormal   Collection Time: 11/29/16 11:22 AM  Result Value Ref Range   WBC 5.9 4.0 - 10.5 K/uL   RBC 2.97 (L) 4.22  - 5.81 MIL/uL   Hemoglobin 8.5 (L) 13.0 - 17.0 g/dL   HCT 81.1 (L) 91.4 - 78.2 %   MCV 81.5 78.0 - 100.0 fL   MCH 28.6 26.0 - 34.0 pg   MCHC 35.1 30.0 - 36.0 g/dL   RDW 95.6 (H) 21.3 - 08.6 %   Platelets 111 (L) 150 - 400 K/uL  BLOOD TRANSFUSION REPORT - SCANNED     Status: None   Collection Time: 11/29/16  1:02 PM   Narrative   Ordered by an unspecified provider.  CBC     Status: Abnormal   Collection Time: 11/29/16  4:18 PM  Result Value Ref Range   WBC 7.0 4.0 - 10.5 K/uL   RBC 2.88 (L) 4.22 - 5.81 MIL/uL   Hemoglobin 8.4 (L) 13.0 - 17.0 g/dL   HCT 57.8 (L) 46.9 - 62.9 %  MCV 82.3 78.0 - 100.0 fL   MCH 29.2 26.0 - 34.0 pg   MCHC 35.4 30.0 - 36.0 g/dL   RDW 16.1 (H) 09.6 - 04.5 %   Platelets 110 (L) 150 - 400 K/uL  Lactic acid, plasma     Status: None   Collection Time: 11/29/16  5:13 PM  Result Value Ref Range   Lactic Acid, Venous 1.4 0.5 - 1.9 mmol/L  CBC     Status: Abnormal   Collection Time: 11/29/16  9:43 PM  Result Value Ref Range   WBC 6.9 4.0 - 10.5 K/uL   RBC 2.86 (L) 4.22 - 5.81 MIL/uL   Hemoglobin 8.0 (L) 13.0 - 17.0 g/dL   HCT 40.9 (L) 81.1 - 91.4 %   MCV 82.9 78.0 - 100.0 fL   MCH 28.0 26.0 - 34.0 pg   MCHC 33.8 30.0 - 36.0 g/dL   RDW 78.2 (H) 95.6 - 21.3 %   Platelets 125 (L) 150 - 400 K/uL  Protime-INR     Status: Abnormal   Collection Time: 11/30/16  4:01 AM  Result Value Ref Range   Prothrombin Time 17.1 (H) 11.4 - 15.2 seconds   INR 1.38   CBC with Differential/Platelet     Status: Abnormal   Collection Time: 11/30/16  4:01 AM  Result Value Ref Range   WBC 7.0 4.0 - 10.5 K/uL   RBC 2.70 (L) 4.22 - 5.81 MIL/uL   Hemoglobin 7.8 (L) 13.0 - 17.0 g/dL   HCT 08.6 (L) 57.8 - 46.9 %   MCV 83.7 78.0 - 100.0 fL   MCH 28.9 26.0 - 34.0 pg   MCHC 34.5 30.0 - 36.0 g/dL   RDW 62.9 (H) 52.8 - 41.3 %   Platelets 117 (L) 150 - 400 K/uL   Neutrophils Relative % 75 %   Neutro Abs 5.3 1.7 - 7.7 K/uL   Lymphocytes Relative 11 %   Lymphs Abs 0.8 0.7 - 4.0  K/uL   Monocytes Relative 8 %   Monocytes Absolute 0.6 0.1 - 1.0 K/uL   Eosinophils Relative 6 %   Eosinophils Absolute 0.4 0.0 - 0.7 K/uL   Basophils Relative 0 %   Basophils Absolute 0.0 0.0 - 0.1 K/uL  Basic metabolic panel     Status: Abnormal   Collection Time: 11/30/16  4:01 AM  Result Value Ref Range   Sodium 141 135 - 145 mmol/L   Potassium 3.3 (L) 3.5 - 5.1 mmol/L   Chloride 111 101 - 111 mmol/L   CO2 24 22 - 32 mmol/L   Glucose, Bld 120 (H) 65 - 99 mg/dL   BUN 16 6 - 20 mg/dL   Creatinine, Ser 2.44 0.61 - 1.24 mg/dL   Calcium 7.2 (L) 8.9 - 10.3 mg/dL   GFR calc non Af Amer >60 >60 mL/min   GFR calc Af Amer >60 >60 mL/min   Anion gap 6 5 - 15     Assessment/Plan:   NEURO  Altered Mental Status:  sedation   Plan: Will perform a wake up assessment  Periodically   PULM  Atelectasis/collapse (focal and left side)   Plan: No specific treatment.  May wean on the ventilator if tolerated.  CARDIO  Valve Replacement (mitral) >= 7 days No acute cardiothoracic problems   Plan: Anticoagulation needs to be resumed as soon as possible.  Not now though.  Possible start on heparin once bleeding is controlled--maybe Sunday or Monday.  He actually has an aortic valve replacement,  no mitral  RENAL  Urine output and renal function are okay.  CVP is high and we may be able to diurese the patient   Plan: CPM  GI  Abdominal wall tightness,  BS present    Plan: Tube feeding at 20  ID  No known infectious problems.   Plan: CPM  HEME  Anemia acute blood loss anemia) Thrombocytopenia (consumptive)   Plan: No blood products needed at this time.  PT/INR, 16.3/1.3  ENDO No known issues   Plan: CPM  Global Issues  Patient is  stable.   Did not tolerate wean at this point.                                                       LOS: 2 days   Additional comments:None  Critical Care Total Time*: 30 Minutes  Caleb Beaudin A. 11/30/2016  *Care during the  described time interval was provided by me and/or other providers on the critical care team.  I have reviewed this patient's available data, including medical history, events of note, physical examination and test results as part of my evaluation.

## 2016-12-01 ENCOUNTER — Inpatient Hospital Stay (HOSPITAL_COMMUNITY): Payer: BLUE CROSS/BLUE SHIELD

## 2016-12-01 LAB — PROTIME-INR
INR: 1.24
PROTHROMBIN TIME: 15.7 s — AB (ref 11.4–15.2)

## 2016-12-01 LAB — HEPARIN LEVEL (UNFRACTIONATED): Heparin Unfractionated: <0.1 [IU]/mL — ABNORMAL LOW (ref 0.30–0.70)

## 2016-12-01 LAB — TRIGLYCERIDES: TRIGLYCERIDES: 178 mg/dL — AB (ref <150)

## 2016-12-01 MED ORDER — HEPARIN (PORCINE) IN NACL 100-0.45 UNIT/ML-% IJ SOLN
1850.0000 [IU]/h | INTRAMUSCULAR | Status: AC
  Administered 2016-12-02: 1500 [IU]/h via INTRAVENOUS
  Administered 2016-12-02: 1600 [IU]/h via INTRAVENOUS
  Administered 2016-12-03 – 2016-12-05 (×4): 1900 [IU]/h via INTRAVENOUS
  Administered 2016-12-05 – 2016-12-06 (×2): 1800 [IU]/h via INTRAVENOUS
  Administered 2016-12-07: 1850 [IU]/h via INTRAVENOUS
  Filled 2016-12-01 (×14): qty 250

## 2016-12-01 MED ORDER — POTASSIUM CHLORIDE 10 MEQ/100ML IV SOLN
10.0000 meq | INTRAVENOUS | Status: AC
  Administered 2016-12-01 (×4): 10 meq via INTRAVENOUS
  Filled 2016-12-01 (×4): qty 100

## 2016-12-01 MED ORDER — HEPARIN (PORCINE) IN NACL 100-0.45 UNIT/ML-% IJ SOLN
950.0000 [IU]/h | INTRAMUSCULAR | Status: DC
  Administered 2016-12-01: 950 [IU]/h via INTRAVENOUS
  Filled 2016-12-01: qty 250
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
## 2016-12-01 NOTE — Progress Notes (Addendum)
Spoke with patient's long time friend, Caleb MalletMary Dawson, over the phone.  She shared that patient was on coumadin as well as thyroid medication.  Other [place] and that [facility] [place], she had no further information about his prescriptions or prescribing physicians. [name] C 12/01/16   Patient's friend was able to secure Rx bottles and brought to room for us to see.  Pharm Tech came to room to document all prescriptions. [name] C 5:27 PM

## 2016-12-02 ENCOUNTER — Inpatient Hospital Stay (HOSPITAL_COMMUNITY): Payer: BLUE CROSS/BLUE SHIELD

## 2016-12-02 LAB — CBC
HCT: 24.1 % — ABNORMAL LOW (ref 39.0–52.0)
HEMATOCRIT: 22.7 % — AB (ref 39.0–52.0)
HEMOGLOBIN: 7.5 g/dL — AB (ref 13.0–17.0)
HEMOGLOBIN: 7.8 g/dL — AB (ref 13.0–17.0)
MCH: 28.6 pg (ref 26.0–34.0)
MCH: 28.8 pg (ref 26.0–34.0)
MCHC: 32.4 g/dL (ref 30.0–36.0)
MCHC: 33 g/dL (ref 30.0–36.0)
MCV: 87.3 fL (ref 78.0–100.0)
MCV: 88.3 fL (ref 78.0–100.0)
PLATELETS: 143 10*3/uL — AB (ref 150–400)
Platelets: 126 10*3/uL — ABNORMAL LOW (ref 150–400)
RBC: 2.6 MIL/uL — ABNORMAL LOW (ref 4.22–5.81)
RBC: 2.73 MIL/uL — AB (ref 4.22–5.81)
RDW: 16.1 % — ABNORMAL HIGH (ref 11.5–15.5)
RDW: 16.1 % — ABNORMAL HIGH (ref 11.5–15.5)
WBC: 6.5 10*3/uL (ref 4.0–10.5)
WBC: 7.3 10*3/uL (ref 4.0–10.5)

## 2016-12-02 LAB — TYPE AND SCREEN
ABO/RH(D): A POS
ANTIBODY SCREEN: NEGATIVE
UNIT DIVISION: 0
UNIT DIVISION: 0
UNIT DIVISION: 0
UNIT DIVISION: 0
UNIT DIVISION: 0
UNIT DIVISION: 0
UNIT DIVISION: 0
UNIT DIVISION: 0
UNIT DIVISION: 0
UNIT DIVISION: 0
UNIT DIVISION: 0
UNIT DIVISION: 0
Unit division: 0
Unit division: 0
Unit division: 0
Unit division: 0
Unit division: 0
Unit division: 0
Unit division: 0
Unit division: 0

## 2016-12-02 LAB — BPAM RBC
BLOOD PRODUCT EXPIRATION DATE: 201807202359
BLOOD PRODUCT EXPIRATION DATE: 201807212359
BLOOD PRODUCT EXPIRATION DATE: 201807242359
BLOOD PRODUCT EXPIRATION DATE: 201807242359
BLOOD PRODUCT EXPIRATION DATE: 201807242359
BLOOD PRODUCT EXPIRATION DATE: 201807242359
BLOOD PRODUCT EXPIRATION DATE: 201807252359
BLOOD PRODUCT EXPIRATION DATE: 201807252359
BLOOD PRODUCT EXPIRATION DATE: 201807252359
Blood Product Expiration Date: 201807172359
Blood Product Expiration Date: 201807192359
Blood Product Expiration Date: 201807192359
Blood Product Expiration Date: 201807222359
Blood Product Expiration Date: 201807242359
Blood Product Expiration Date: 201807242359
Blood Product Expiration Date: 201807242359
Blood Product Expiration Date: 201807242359
Blood Product Expiration Date: 201807242359
Blood Product Expiration Date: 201807242359
Blood Product Expiration Date: 201807252359
ISSUE DATE / TIME: 201807121036
ISSUE DATE / TIME: 201807121036
ISSUE DATE / TIME: 201807121055
ISSUE DATE / TIME: 201807121204
ISSUE DATE / TIME: 201807121204
ISSUE DATE / TIME: 201807121204
ISSUE DATE / TIME: 201807121204
ISSUE DATE / TIME: 201807121411
ISSUE DATE / TIME: 201807121411
ISSUE DATE / TIME: 201807121428
ISSUE DATE / TIME: 201807121428
ISSUE DATE / TIME: 201807121428
ISSUE DATE / TIME: 201807121428
ISSUE DATE / TIME: 201807121428
ISSUE DATE / TIME: 201807121428
ISSUE DATE / TIME: 201807131020
UNIT TYPE AND RH: 6200
UNIT TYPE AND RH: 6200
UNIT TYPE AND RH: 6200
UNIT TYPE AND RH: 6200
UNIT TYPE AND RH: 6200
UNIT TYPE AND RH: 6200
UNIT TYPE AND RH: 6200
UNIT TYPE AND RH: 6200
UNIT TYPE AND RH: 9500
UNIT TYPE AND RH: 9500
Unit Type and Rh: 6200
Unit Type and Rh: 6200
Unit Type and Rh: 6200
Unit Type and Rh: 6200
Unit Type and Rh: 6200
Unit Type and Rh: 6200
Unit Type and Rh: 6200
Unit Type and Rh: 6200
Unit Type and Rh: 9500
Unit Type and Rh: 9500

## 2016-12-02 LAB — BASIC METABOLIC PANEL
ANION GAP: 4 — AB (ref 5–15)
BUN: 12 mg/dL (ref 6–20)
CALCIUM: 7.5 mg/dL — AB (ref 8.9–10.3)
CHLORIDE: 111 mmol/L (ref 101–111)
CO2: 25 mmol/L (ref 22–32)
CREATININE: 0.74 mg/dL (ref 0.61–1.24)
GFR calc non Af Amer: >60 mL/min (ref >60)
Glucose, Bld: 123 mg/dL — ABNORMAL HIGH (ref 65–99)
Potassium: 3.7 mmol/L (ref 3.5–5.1)
SODIUM: 140 mmol/L (ref 135–145)

## 2016-12-02 LAB — BLOOD GAS, ARTERIAL
Acid-Base Excess: 0.9 mmol/L (ref 0.0–2.0)
Bicarbonate: 25.4 mmol/L (ref 20.0–28.0)
Drawn by: 33100
FIO2: 40
O2 Saturation: 98.7 %
PEEP/CPAP: 5 cmH2O
PH ART: 7.388 (ref 7.350–7.450)
PRESSURE SUPPORT: 10 cmH2O
Patient temperature: 98.8
pCO2 arterial: 43.1 mmHg (ref 32.0–48.0)
pO2, Arterial: 115 mmHg — ABNORMAL HIGH (ref 83.0–108.0)

## 2016-12-02 LAB — PROTIME-INR
INR: 1.14
PROTHROMBIN TIME: 14.7 s (ref 11.4–15.2)

## 2016-12-02 LAB — HEPARIN LEVEL (UNFRACTIONATED)
HEPARIN UNFRACTIONATED: 0.26 [IU]/mL — AB (ref 0.30–0.70)
Heparin Unfractionated: 0.29 [IU]/mL — ABNORMAL LOW (ref 0.30–0.70)

## 2016-12-02 MED ORDER — PRO-STAT SUGAR FREE PO LIQD
60.0000 mL | Freq: Two times a day (BID) | ORAL | Status: DC
  Administered 2016-12-02 – 2016-12-05 (×7): 60 mL
  Filled 2016-12-02 (×7): qty 60

## 2016-12-02 MED ORDER — ADULT MULTIVITAMIN LIQUID CH
15.0000 mL | Freq: Every day | ORAL | Status: DC
  Administered 2016-12-02 – 2016-12-05 (×4): 15 mL
  Filled 2016-12-02 (×3): qty 15

## 2016-12-02 MED ORDER — CHLORHEXIDINE GLUCONATE CLOTH 2 % EX PADS
6.0000 | MEDICATED_PAD | Freq: Every day | CUTANEOUS | Status: DC
  Administered 2016-12-02 – 2016-12-07 (×6): 6 via TOPICAL

## 2016-12-02 MED ORDER — PIVOT 1.5 CAL PO LIQD
1000.0000 mL | ORAL | Status: DC
  Administered 2016-12-02 – 2016-12-05 (×5): 1000 mL
  Filled 2016-12-02 (×8): qty 1000

## 2016-12-02 MED ORDER — FUROSEMIDE 10 MG/ML IJ SOLN
40.0000 mg | Freq: Once | INTRAMUSCULAR | Status: AC
  Administered 2016-12-02: 40 mg via INTRAVENOUS
  Filled 2016-12-02: qty 4
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
## 2016-12-02 NOTE — Progress Notes (Signed)
ANTICOAGULATION CONSULT NOTE - Follow Up Consult  Pharmacy Consult for heparin Indication: aortic valve repair  Labs:  Recent Labs  11/29/16 0349  11/29/16 2143 11/30/16 0401 12/01/16 0535 12/01/16 1512 12/02/16 0000 12/02/16 0012  HGB 8.5*  < > 8.0* 7.8*  --   --  7.5*  --   HCT 24.7*  < > 23.7* 22.6*  --   --  22.7*  --   PLT 124*  < > 125* 117*  --   --  126*  --   LABPROT 16.3*  --   --  17.1* 15.7*  --   --   --   INR 1.30  --   --  1.38 1.24  --   --   --   HEPARINUNFRC  --   --   --   --   --  <0.10*  --  <0.10*  CREATININE 1.18  --   --  1.03  --   --   --   --   < > = values in this interval not displayed.   Assessment: 66yo male remains undetectable on heparin after conservative rate change.  Goal of Therapy:  Heparin level 0.3-0.7 units/ml   Plan:  Will increase heparin gtt by 4 units/kg/hr to 1500 units/hr and check level in 6hr.  Vernard GamblesVeronda Dakin Madani, PharmD, BCPS  12/02/2016,2:19 AM

## 2016-12-02 NOTE — Progress Notes (Signed)
Follow up - Trauma and Critical Care  Patient Details:    Caleb Dawson is an 66 y.o. male.  Lines/tubes : Airway 7.5 mm (Active)  Secured at (cm) 23 cm 12/02/2016  7:41 AM  Measured From Lips 12/02/2016  7:41 AM  Secured Location Right 12/02/2016  7:41 AM  Secured By [employer] T[employer] Holder 12/02/2016  7:41 AM  Tube Holder Repositioned Yes 12/02/2016  7:41 AM  Cuff Pressure (cm H2O) 30 cm H2O 12/02/2016  7:41 AM  Site Condition Dry 12/02/2016  7:41 AM     CVC Triple Lumen 11/28/16 Left Internal jugular (Active)  Indication for Insertion or Continuance of Line Vasoactive infusions;Prolonged intravenous therapies 12/01/2016  8:00 PM  Site Assessment Dry;Intact 12/01/2016  8:00 PM  Proximal Lumen Status Infusing 12/01/2016  8:00 PM  Medial Lumen Status Infusing 12/01/2016  8:00 PM  Distal Lumen Status In-line blood sampling system in place 12/01/2016  8:00 PM  Dressing Type Transparent;Occlusive 12/01/2016  8:00 PM  Dressing Status Dry;Intact;Antimicrobial disc in place 12/01/2016  8:00 PM  Line Care Connections checked and tightened 12/01/2016  8:00 PM  Dressing Intervention Dressing changed;Antimicrobial disc changed 12/01/2016 12:00 PM  Dressing Change Due 12/08/16 12/01/2016  8:00 PM     Arterial Line 11/28/16 Radial (Active)  Site Assessment Clean;Dry;Intact 12/01/2016  8:00 PM  Line Status Pulsatile blood flow 12/01/2016  8:00 PM  Art Line Waveform Appropriate 12/01/2016  8:00 PM  Art Line Interventions Zeroed and calibrated 12/01/2016  8:00 PM  Color/Movement/Sensation Capillary refill less than 3 sec 12/01/2016  8:00 PM  Dressing Type Transparent;Occlusive 12/01/2016  8:00 PM  Dressing Status Clean;Dry;Intact 12/01/2016  8:00 PM  Dressing Change Due 12/05/16 12/01/2016  8:00 PM     Negative Pressure Wound Therapy Leg Left;Anterior;Lower (Active)  Last dressing change 11/28/16 12/01/2016  8:00 PM  Site / Wound Assessment Dressing in place / Unable to assess 12/01/2016  8:00 PM  Cycle Continuous;On  12/01/2016  8:00 PM  Target Pressure (mmHg) 125 12/01/2016  8:00 PM  Dressing Status Intact 12/01/2016  8:00 PM  Drainage Amount None 12/01/2016  8:00 PM  Output (mL) 0 mL 12/01/2016  6:00 AM     Urethral Catheter Dr. Lindie SpruceWyatt (Active)  Indication for Insertion or Continuance of Catheter Bladder outlet obstruction / other urologic reason 12/01/2016  8:00 PM  Site Assessment Clean;Intact 12/01/2016  8:00 PM  Catheter Maintenance Bag below level of bladder;Catheter secured;Drainage bag/tubing not touching floor;Insertion date on drainage bag;No dependent loops;Seal intact;Bag emptied prior to transport 12/01/2016  8:00 PM  Collection Container Standard drainage bag 12/01/2016  8:00 PM  Securement Method Securing device (Describe) 12/01/2016  8:00 PM  Urinary Catheter Interventions Unclamped 12/01/2016  8:00 PM  Output (mL) 175 mL 12/02/2016  6:00 AM    Microbiology/Sepsis markers: Results for orders placed or performed during the hospital encounter of 11/28/16  MRSA PCR Screening     Status: None   Collection Time: 11/28/16  5:01 PM  Result Value Ref Range Status   MRSA by PCR NEGATIVE NEGATIVE Final    Comment:        The GeneXpert MRSA Assay (FDA approved for NASAL specimens only), is one component of a comprehensive MRSA colonization surveillance program. It is not intended to diagnose MRSA infection nor to guide or monitor treatment for MRSA infections.     Anti-infectives:  Anti-infectives    Start     Dose/Rate Route Frequency Ordered Stop   11/28/16 2200  ceFAZolin (ANCEF) IVPB 1  g/50 mL premix     1 g 100 mL/hr over 30 Minutes Intravenous Every 8 hours 11/28/16 1707 11/30/16 1439   11/28/16 1715  ceFAZolin (ANCEF) IVPB 2g/100 mL premix  Status:  Discontinued     2 g 200 mL/hr over 30 Minutes Intravenous Every 8 hours 11/28/16 1707 11/28/16 1726      Best Practice/Protocols:  VTE Prophylaxis: Heparin (drip) and Mechanical GI Prophylaxis: Proton Pump Inhibitor Continous  Sedation  Consults: Treatment Team:  Myrene Galas, MD Betha Loa, MD    Events:  Subjective:    Overnight Issues: Weaning on the ventilator  Objective:  Vital signs for last 24 hours: Temp:  [97.9 F (36.6 C)-98.8 F (37.1 C)] 98.8 F (37.1 C) (07/16 0753) Pulse Rate:  [30-91] 75 (07/16 0741) Resp:  [14-28] 15 (07/16 0741) BP: (104-165)/(62-88) 165/74 (07/16 0741) SpO2:  [99 %-100 %] 99 % (07/16 0741) Arterial Line BP: (119-172)/(52-71) 135/57 (07/16 0700) FiO2 (%):  [40 %] 40 % (07/16 0741)  Hemodynamic parameters for last 24 hours: CVP:  [14 mmHg-19 mmHg] 15 mmHg  Intake/Output from previous day: 07/15 0701 - 07/16 0700 In: 2806.1 [I.V.:2326.1; NG/GT:480] Out: 1275 [Urine:1275]  Intake/Output this shift: No intake/output data recorded.  Vent settings for last 24 hours: Vent Mode: CPAP;PSV FiO2 (%):  [40 %] 40 % Set Rate:  [16 bmp] 16 bmp Vt Set:  [550 mL] 550 mL PEEP:  [5 cmH20] 5 cmH20 Pressure Support:  [10 cmH20] 10 cmH20 Plateau Pressure:  [20 cmH20-26 cmH20] 20 cmH20  Physical Exam:  General: no respiratory distress and will awaken and follow commands. Neuro: nonfocal exam and RASS -1 Resp: clear to auscultation bilaterally and CXR is pending. CVS: regular rate and rhythm, S1, S2 normal, no murmur, click, rub or gallop GI: soft, nontender, BS WNL, no r/g and some bowel sounds.  Tolerating tube feedings. Extremities: edema 2+  Results for orders placed or performed during the hospital encounter of 11/28/16 (from the past 24 hour(s))  Heparin level (unfractionated)     Status: Abnormal   Collection Time: 12/01/16  3:12 PM  Result Value Ref Range   Heparin Unfractionated <0.10 (L) 0.30 - 0.70 IU/mL  Triglycerides     Status: Abnormal   Collection Time: 12/01/16  4:57 PM  Result Value Ref Range   Triglycerides 178 (H) <150 mg/dL  CBC     Status: Abnormal   Collection Time: 12/02/16 12:00 AM  Result Value Ref Range   WBC 6.5 4.0 - 10.5 K/uL    RBC 2.60 (L) 4.22 - 5.81 MIL/uL   Hemoglobin 7.5 (L) 13.0 - 17.0 g/dL   HCT 16.1 (L) 09.6 - 04.5 %   MCV 87.3 78.0 - 100.0 fL   MCH 28.8 26.0 - 34.0 pg   MCHC 33.0 30.0 - 36.0 g/dL   RDW 40.9 (H) 81.1 - 91.4 %   Platelets 126 (L) 150 - 400 K/uL  Heparin level (unfractionated)     Status: Abnormal   Collection Time: 12/02/16 12:12 AM  Result Value Ref Range   Heparin Unfractionated <0.10 (L) 0.30 - 0.70 IU/mL  Basic metabolic panel     Status: Abnormal   Collection Time: 12/02/16  4:03 AM  Result Value Ref Range   Sodium 140 135 - 145 mmol/L   Potassium 3.7 3.5 - 5.1 mmol/L   Chloride 111 101 - 111 mmol/L   CO2 25 22 - 32 mmol/L   Glucose, Bld 123 (H) 65 - 99 mg/dL   BUN  12 6 - 20 mg/dL   Creatinine, Ser 1.61 0.61 - 1.24 mg/dL   Calcium 7.5 (L) 8.9 - 10.3 mg/dL   GFR calc non Af Amer >60 >60 mL/min   GFR calc Af Amer >60 >60 mL/min   Anion gap 4 (L) 5 - 15  CBC     Status: Abnormal   Collection Time: 12/02/16  4:03 AM  Result Value Ref Range   WBC 7.3 4.0 - 10.5 K/uL   RBC 2.73 (L) 4.22 - 5.81 MIL/uL   Hemoglobin 7.8 (L) 13.0 - 17.0 g/dL   HCT 09.6 (L) 04.5 - 40.9 %   MCV 88.3 78.0 - 100.0 fL   MCH 28.6 26.0 - 34.0 pg   MCHC 32.4 30.0 - 36.0 g/dL   RDW 81.1 (H) 91.4 - 78.2 %   Platelets 143 (L) 150 - 400 K/uL  Heparin level (unfractionated)     Status: Abnormal   Collection Time: 12/02/16  8:29 AM  Result Value Ref Range   Heparin Unfractionated 0.29 (L) 0.30 - 0.70 IU/mL     Assessment/Plan:   NEURO  Altered Mental Status:  sedation   Plan: but awakens appropriately.  Wean sedation as tolerated.  PULM  No apparent disorder, but awaiting CXR.  ETT is pulled back and needs to be advanced.   Plan: CXR, wean, not extubate yet, but if going to surgery for right wrist soon, will not extubate  CARDIO  No specific issues   Plan: CPM  RENAL  Urine output and renal function are good.   Plan: CPM. Lasix  GI  No specific issues   Plan: Tube feedings to goal  ID  No  known infecitous problem   Plan: CPM  HEME  Anemia acute blood loss anemia)   Plan: Stable.  Now on heparin for his heart valve, hemoglboin has been stable.  ENDO No known issues   Plan: CPM  Global Issues  Open book pelvic fracture with hemodynamic instability, angiography, pelvic stabilization.  Doing very well.  No able to extubate yet, but possible soon.Increase tube feedings to goal.  Advance ETT.    LOS: 4 days   Additional comments:I reviewed the patient's new clinical lab test results. cbc/bmet and I reviewed the patients new imaging test results. cxr  Critical Care Total Time*: 45 Minutes  Denita Lun 12/02/2016  *Care during the described time interval was provided by me and/or other providers on the critical care team.  I have reviewed this patient's available data, including medical history, events of note, physical examination and test results as part of my evaluation.
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
## 2016-12-03 DIAGNOSIS — S329XXA Fracture of unspecified parts of lumbosacral spine and pelvis, initial encounter for closed fracture: Secondary | ICD-10-CM | POA: Diagnosis present

## 2016-12-03 LAB — CBC
HEMATOCRIT: 23.1 % — AB (ref 39.0–52.0)
HEMOGLOBIN: 7.5 g/dL — AB (ref 13.0–17.0)
MCH: 28.5 pg (ref 26.0–34.0)
MCHC: 32.5 g/dL (ref 30.0–36.0)
MCV: 87.8 fL (ref 78.0–100.0)
Platelets: 160 10*3/uL (ref 150–400)
RBC: 2.63 MIL/uL — ABNORMAL LOW (ref 4.22–5.81)
RDW: 15.5 % (ref 11.5–15.5)
WBC: 5.9 10*3/uL (ref 4.0–10.5)

## 2016-12-03 LAB — BASIC METABOLIC PANEL
Anion gap: 7 (ref 5–15)
BUN: 17 mg/dL (ref 6–20)
CALCIUM: 7.1 mg/dL — AB (ref 8.9–10.3)
CO2: 28 mmol/L (ref 22–32)
Chloride: 106 mmol/L (ref 101–111)
Creatinine, Ser: 0.7 mg/dL (ref 0.61–1.24)
GLUCOSE: 121 mg/dL — AB (ref 65–99)
POTASSIUM: 3.9 mmol/L (ref 3.5–5.1)
Sodium: 141 mmol/L (ref 135–145)

## 2016-12-03 LAB — PROTIME-INR
INR: 1.16
Prothrombin Time: 14.8 seconds (ref 11.4–15.2)

## 2016-12-03 LAB — HEPARIN LEVEL (UNFRACTIONATED)
HEPARIN UNFRACTIONATED: 0.48 [IU]/mL (ref 0.30–0.70)
Heparin Unfractionated: 0.29 IU/mL — ABNORMAL LOW (ref 0.30–0.70)
Heparin Unfractionated: 0.48 [IU]/mL (ref 0.30–0.70)

## 2016-12-03 MED ORDER — FUROSEMIDE 10 MG/ML IJ SOLN
40.0000 mg | Freq: Once | INTRAMUSCULAR | Status: AC
  Administered 2016-12-03: 40 mg via INTRAVENOUS
  Filled 2016-12-03: qty 4

## 2016-12-03 MED ORDER — SODIUM CHLORIDE 0.9 % IV SOLN
1.0000 g | Freq: Once | INTRAVENOUS | Status: AC
  Administered 2016-12-03: 1 g via INTRAVENOUS
  Filled 2016-12-03: qty 10
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
## 2016-12-03 NOTE — Progress Notes (Signed)
Subjective: 5 Days Post-Op Procedure(s) (LRB): 2nd order catheter placement from Common Iliac Contralateral CPT 36246 (Right) ULTRASOUND GUIDANCE FOR VASCULAR ACCESS, left iliac artery CPT [phone] [id number] (Left) PELVIC ANGIOGRAPHY CPT 75741 (N/A) Abdominal Aortagram CPT [id number] (N/A) Patient reports pain as intubated.  responsive to pain..    Objective: Vital signs in last 24 hours: Temp:  [98 F (36.7 C)-99.7 F (37.6 C)] 99.1 F (37.3 C) (07/17 1600) Pulse Rate:  [57-78] 73 (07/17 1508) Resp:  [10-23] 23 (07/17 1508) BP: (107-130)/(64-78) 123/71 (07/17 1508) SpO2:  [95 %-100 %] 100 % (07/17 1508) Arterial Line BP: (120-154)/(55-67) 149/65 (07/17 1300) FiO2 (%):  [40 %] 40 % (07/17 1508)  Intake/Output from previous day: 07/16 0701 - 07/17 0700 In: 1891.3 [I.V.:1221.3; NG/GT:670] Out: 4200 [Urine:4190; Drains:10] Intake/Output this shift: Total I/O In: 627.8 [I.V.:337.8; NG/GT:180; IV Piggyback:110] Out: 1000 [Urine:1000]   Recent Labs  12/02/16 0000 12/02/16 0403 12/03/16 0523  HGB 7.5* 7.8* 7.5*    Recent Labs  12/02/16 0403 12/03/16 0523  WBC 7.3 5.9  RBC 2.73* 2.63*  HCT 24.1* 23.1*  PLT 143* 160    Recent Labs  12/02/16 0403 12/03/16 0523  NA 140 141  K 3.7 3.9  CL 111 106  CO2 25 28  BUN 12 17  CREATININE 0.74 0.70  GLUCOSE 123* 121*  CALCIUM 7.5* 7.1*    Recent Labs  12/02/16 1150 12/03/16 0523  INR 1.14 1.16    able to wiggle fingers.  intact light touch sensation.  intact capillary refill.  splint intact.  pain with motion of digits.  Assessment/Plan: 5 Days Post-Op Procedure(s) (LRB): 2nd order catheter placement from Common Iliac Contralateral CPT 36246 (Right) ULTRASOUND GUIDANCE FOR VASCULAR ACCESS, left iliac artery CPT [phone] (Left) PELVIC ANGIOGRAPHY CPT 75741 (N/A) Abdominal Aortagram CPT [id number] (N/A) Right CMC dislocations.  Will get CT right hand for evaluation of reduction.  Likely will need pin fixation of CMC joints.  May have  longitudinal instability of index/long metacarpal space/carpus.  Ronette Hank R 12/03/2016, 5:04 PM
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
## 2016-12-04 ENCOUNTER — Inpatient Hospital Stay (HOSPITAL_COMMUNITY): Payer: BLUE CROSS/BLUE SHIELD

## 2016-12-04 ENCOUNTER — Encounter (HOSPITAL_COMMUNITY): Payer: Self-pay | Admitting: Orthopedic Surgery

## 2016-12-04 LAB — BASIC METABOLIC PANEL
ANION GAP: 6 (ref 5–15)
BUN: 23 mg/dL — ABNORMAL HIGH (ref 6–20)
CHLORIDE: 104 mmol/L (ref 101–111)
CO2: 29 mmol/L (ref 22–32)
CREATININE: 0.71 mg/dL (ref 0.61–1.24)
Calcium: 8 mg/dL — ABNORMAL LOW (ref 8.9–10.3)
GFR calc non Af Amer: >60 mL/min (ref >60)
Glucose, Bld: 128 mg/dL — ABNORMAL HIGH (ref 65–99)
POTASSIUM: 3.6 mmol/L (ref 3.5–5.1)
SODIUM: 139 mmol/L (ref 135–145)

## 2016-12-04 LAB — CBC
HEMATOCRIT: 23.3 % — AB (ref 39.0–52.0)
HEMOGLOBIN: 7.7 g/dL — AB (ref 13.0–17.0)
MCH: 29.3 pg (ref 26.0–34.0)
MCHC: 33 g/dL (ref 30.0–36.0)
MCV: 88.6 fL (ref 78.0–100.0)
Platelets: 187 10*3/uL (ref 150–400)
RBC: 2.63 MIL/uL — AB (ref 4.22–5.81)
RDW: 15.7 % — ABNORMAL HIGH (ref 11.5–15.5)
WBC: 6.9 10*3/uL (ref 4.0–10.5)

## 2016-12-04 LAB — PROTIME-INR
INR: 1.11
Prothrombin Time: 14.3 seconds (ref 11.4–15.2)

## 2016-12-04 LAB — HEPARIN LEVEL (UNFRACTIONATED): Heparin Unfractionated: 0.49 IU/mL (ref 0.30–0.70)

## 2016-12-04 LAB — TRIGLYCERIDES: TRIGLYCERIDES: 109 mg/dL (ref <150)

## 2016-12-04 MED ORDER — SODIUM CHLORIDE 0.9 % IN NEBU
INHALATION_SOLUTION | RESPIRATORY_TRACT | Status: AC
  Administered 2016-12-04: 23:00:00
  Filled 2016-12-04: qty 3

## 2016-12-04 MED ORDER — FUROSEMIDE 10 MG/ML IJ SOLN
40.0000 mg | Freq: Once | INTRAMUSCULAR | Status: AC
  Administered 2016-12-04: 40 mg via INTRAVENOUS
  Filled 2016-12-04: qty 4
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
## 2016-12-04 NOTE — Progress Notes (Signed)
RT note- Patient transported to CT and back to the ICU, remains on current ventilator settings, continue to monitor.

## 2016-12-04 NOTE — Progress Notes (Signed)
Follow up - Trauma Critical Care  Patient Details:    Caleb Dawson is an 66 y.o. male.  Lines/tubes : Airway 7.5 mm (Active)  Secured at (cm) 26 cm 12/04/2016  8:00 AM  Measured From Lips 12/04/2016  8:00 AM  Secured Location Right 12/04/2016  8:00 AM  Secured By [employer] Tube Holder 12/04/2016  8:00 AM  Tube Holder Repositioned Yes 12/04/2016  8:00 AM  Cuff Pressure (cm H2O) 28 cm H2O 12/03/2016  7:32 PM  Site Condition Dry 12/04/2016  8:00 AM     CVC Triple Lumen 11/28/16 Left Internal jugular (Active)  Indication for Insertion or Continuance of Line Prolonged intravenous therapies 12/03/2016  8:00 PM  Site Assessment Clean;Dry;Intact 12/03/2016  8:00 PM  Proximal Lumen Status Infusing 12/03/2016  8:00 PM  Medial Lumen Status Infusing 12/03/2016  8:00 PM  Distal Lumen Status In-line blood sampling system in place 12/03/2016  8:00 PM  Dressing Type Transparent;Occlusive 12/03/2016  8:00 PM  Dressing Status Dry;Intact;Antimicrobial disc in place 12/03/2016  8:00 PM  Line Care Connections checked and tightened 12/03/2016  8:00 PM  Dressing Intervention Dressing changed;Antimicrobial disc changed 12/01/2016 12:00 PM  Dressing Change Due 12/08/16 12/03/2016  8:00 PM     Arterial Line 11/28/16 Radial (Active)  Site Assessment Clean;Dry;Intact 12/03/2016  8:00 PM  Line Status Pulsatile blood flow 12/03/2016  8:00 PM  Art Line Waveform Appropriate 12/03/2016  8:00 PM  Art Line Interventions Zeroed and calibrated;Leveled;Connections checked and tightened;Flushed per protocol 12/03/2016  8:00 PM  Color/Movement/Sensation Capillary refill less than 3 sec 12/03/2016  8:00 PM  Dressing Type Transparent;Occlusive 12/03/2016  8:00 PM  Dressing Status Clean;Dry;Intact 12/03/2016  8:00 PM  Dressing Change Due 12/05/16 12/02/2016  8:00 AM     Urethral Catheter Dr. Lindie SpruceWyatt (Active)  Indication for Insertion or Continuance of Catheter Unstable spinal/crush injuries;Peri-operative use for selective surgical procedure  12/03/2016  8:00 PM  Site Assessment Clean;Intact 12/03/2016  8:00 PM  Catheter Maintenance Bag below level of bladder;Catheter secured;Drainage bag/tubing not touching floor;Insertion date on drainage bag;No dependent loops;Seal intact 12/03/2016  8:00 PM  Collection Container Standard drainage bag 12/03/2016  8:00 PM  Securement Method Securing device (Describe) 12/03/2016  8:00 PM  Urinary Catheter Interventions Unclamped 12/02/2016  8:00 AM  Output (mL) 85 mL 12/04/2016  6:00 AM    Microbiology/Sepsis markers: Results for orders placed or performed during the hospital encounter of 11/28/16  MRSA PCR Screening     Status: None   Collection Time: 11/28/16  5:01 PM  Result Value Ref Range Status   MRSA by PCR NEGATIVE NEGATIVE Final    Comment:        The GeneXpert MRSA Assay (FDA approved for NASAL specimens only), is one component of a comprehensive MRSA colonization surveillance program. It is not intended to diagnose MRSA infection nor to guide or monitor treatment for MRSA infections.     Anti-infectives:  Anti-infectives    Start     Dose/Rate Route Frequency Ordered Stop   11/28/16 2200  ceFAZolin (ANCEF) IVPB 1 g/50 mL premix     1 g 100 mL/hr over 30 Minutes Intravenous Every 8 hours 11/28/16 1707 11/30/16 1439   11/28/16 1715  ceFAZolin (ANCEF) IVPB 2g/100 mL premix  Status:  Discontinued     2 g 200 mL/hr over 30 Minutes Intravenous Every 8 hours 11/28/16 1707 11/28/16 1726      Best Practice/Protocols:  VTE Prophylaxis: Heparin (drip) Continous Sedation  Consults: Treatment Team:  Myrene GalasHandy, Michael,  MD Betha Loa, MD    Studies:    Events:  Subjective:    Overnight Issues:   Objective:  Vital signs for last 24 hours: Temp:  [98.4 F (36.9 C)-100 F (37.8 C)] 100 F (37.8 C) (07/18 0800) Pulse Rate:  [59-109] 69 (07/18 0700) Resp:  [8-23] 16 (07/18 0700) BP: (117-172)/(60-95) 118/67 (07/18 0700) SpO2:  [96 %-100 %] 96 % (07/18 0800) Arterial  Line BP: (113-204)/(57-86) 113/57 (07/18 0700) FiO2 (%):  [40 %] 40 % (07/18 0800)  Hemodynamic parameters for last 24 hours: CVP:  [12 mmHg-70 mmHg] 12 mmHg  Intake/Output from previous day: 07/17 0701 - 07/18 0700 In: 2229.3 [I.V.:1399.3; NG/GT:720; IV Piggyback:110] Out: 1785 [Urine:1785]  Intake/Output this shift: No intake/output data recorded.  Vent settings for last 24 hours: Vent Mode: PSV;CPAP FiO2 (%):  [40 %] 40 % Set Rate:  [16 bmp] 16 bmp Vt Set:  [550 mL] 550 mL PEEP:  [5 cmH20] 5 cmH20 Pressure Support:  [5 cmH20] 5 cmH20 Plateau Pressure:  [17 cmH20-19 cmH20] 19 cmH20  Physical Exam:  General: on vent Neuro: F/C HEENT/Neck: ETT and collar Resp: clear to auscultation bilaterally CVS: RRR GI: soft, NT, ex fix on pelvis, sig scrotal edema Extremities: ortho VAC LLE  Results for orders placed or performed during the hospital encounter of 11/28/16 (from the past 24 hour(s))  Heparin level (unfractionated)     Status: None   Collection Time: 12/03/16  1:05 PM  Result Value Ref Range   Heparin Unfractionated 0.48 0.30 - 0.70 IU/mL  Heparin level (unfractionated)     Status: None   Collection Time: 12/03/16  8:31 PM  Result Value Ref Range   Heparin Unfractionated 0.48 0.30 - 0.70 IU/mL  CBC     Status: Abnormal   Collection Time: 12/04/16  4:08 AM  Result Value Ref Range   WBC 6.9 4.0 - 10.5 K/uL   RBC 2.63 (L) 4.22 - 5.81 MIL/uL   Hemoglobin 7.7 (L) 13.0 - 17.0 g/dL   HCT 16.1 (L) 09.6 - 04.5 %   MCV 88.6 78.0 - 100.0 fL   MCH 29.3 26.0 - 34.0 pg   MCHC 33.0 30.0 - 36.0 g/dL   RDW 40.9 (H) 81.1 - 91.4 %   Platelets 187 150 - 400 K/uL  Heparin level (unfractionated)     Status: None   Collection Time: 12/04/16  4:08 AM  Result Value Ref Range   Heparin Unfractionated 0.49 0.30 - 0.70 IU/mL  Protime-INR     Status: None   Collection Time: 12/04/16  4:08 AM  Result Value Ref Range   Prothrombin Time 14.3 11.4 - 15.2 seconds   INR 1.11   Basic  metabolic panel     Status: Abnormal   Collection Time: 12/04/16  4:08 AM  Result Value Ref Range   Sodium 139 135 - 145 mmol/L   Potassium 3.6 3.5 - 5.1 mmol/L   Chloride 104 101 - 111 mmol/L   CO2 29 22 - 32 mmol/L   Glucose, Bld 128 (H) 65 - 99 mg/dL   BUN 23 (H) 6 - 20 mg/dL   Creatinine, Ser 7.82 0.61 - 1.24 mg/dL   Calcium 8.0 (L) 8.9 - 10.3 mg/dL   GFR calc non Af Amer >60 >60 mL/min   GFR calc Af Amer >60 >60 mL/min   Anion gap 6 5 - 15    Assessment & Plan: Present on Admission: . Multiple closed pelvic fractures with disruption of pelvic circle (  HCC) . Pelvic fracture (HCC)    LOS: 6 days   Additional comments:I reviewed the patient's new clinical lab test results. and CXR MCC Vent dependent resp failure - weaning but will not extubate today as reportedly going to OR tomorrow with Dr. Georges Lynch APC pelvic ring FX - per Dr. Carola Frost, S/P ex fix and SI screws, NWB RLE, WBAT LLE TF only, back to OR next week to plate anteriorly R knee instability - per Dr. Carola Frost, MR once heart valve issue sorted out R talus FX, open L tibia FX - per Dr. Carola Frost R hand [facility] dislocations - to OR with Dr. Merlyn Lot ? Tomorrow T2 corner FX - collar ABL anemia - S/P angio pelvis, follow FEN - TF, hold at [place] for OR, lasix as CXR shows edema VTE - heparin drip as has heart valve Dispo - ICU Critical Care Total Time*: 30 Minutes  Violeta Gelinas, MD, MPH, FACS Trauma: [phone] General Surgery: [phone]  12/04/2016  *Care during the described time interval was provided by me. I have reviewed this patient's available data, including medical history, events of note, physical examination and test results as part of my evaluation.  Patient ID: Caleb Dawson, male   DOB: 08-09-1950, 66 y.o.   MRN: [id number]

## 2016-12-04 NOTE — Progress Notes (Signed)
RT note-Placed back to full support, moderately aggitated and MRI trip pending, continue to monitor.

## 2016-12-05 ENCOUNTER — Inpatient Hospital Stay (HOSPITAL_COMMUNITY): Payer: BLUE CROSS/BLUE SHIELD

## 2016-12-05 LAB — CBC
HCT: 23.2 % — ABNORMAL LOW (ref 39.0–52.0)
Hemoglobin: 7.5 g/dL — ABNORMAL LOW (ref 13.0–17.0)
MCH: 29.1 pg (ref 26.0–34.0)
MCHC: 32.3 g/dL (ref 30.0–36.0)
MCV: 89.9 fL (ref 78.0–100.0)
PLATELETS: 229 10*3/uL (ref 150–400)
RBC: 2.58 MIL/uL — ABNORMAL LOW (ref 4.22–5.81)
RDW: 15.3 % (ref 11.5–15.5)
WBC: 7.9 10*3/uL (ref 4.0–10.5)

## 2016-12-05 LAB — BASIC METABOLIC PANEL
Anion gap: 5 (ref 5–15)
BUN: 30 mg/dL — AB (ref 6–20)
CO2: 31 mmol/L (ref 22–32)
CREATININE: 0.84 mg/dL (ref 0.61–1.24)
Calcium: 8 mg/dL — ABNORMAL LOW (ref 8.9–10.3)
Chloride: 106 mmol/L (ref 101–111)
GFR calc Af Amer: >60 mL/min (ref >60)
GLUCOSE: 124 mg/dL — AB (ref 65–99)
Potassium: 3.6 mmol/L (ref 3.5–5.1)
SODIUM: 142 mmol/L (ref 135–145)

## 2016-12-05 LAB — HEPARIN LEVEL (UNFRACTIONATED): HEPARIN UNFRACTIONATED: 0.63 [IU]/mL (ref 0.30–0.70)

## 2016-12-05 LAB — PROTIME-INR
INR: 1.04
Prothrombin Time: 13.6 s (ref 11.4–15.2)

## 2016-12-05 NOTE — Progress Notes (Signed)
Patient transported back to 4N20. Patient stable upon arrival.
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
## 2016-12-05 NOTE — Progress Notes (Addendum)
Patient in MRI machine after converting cardiac monitor and IVs to MRI compatible equipment. Patient c/o severe pain. Fentanyl bolus given. Cardiac rhythm noted to be sinus rhythm. Vital signs stable. Patient appears to be stable on vent. Will monitor closely for changes.

## 2016-12-05 NOTE — Progress Notes (Signed)
RT note: Attempted to wean patient this am 5/5 40% patient had no effort, will attempt to try again later. Vitals were stable throughout, RT will continue to monitor

## 2016-12-06 ENCOUNTER — Inpatient Hospital Stay (HOSPITAL_COMMUNITY): Payer: BLUE CROSS/BLUE SHIELD

## 2016-12-06 LAB — CBC
HCT: 23.7 % — ABNORMAL LOW (ref 39.0–52.0)
Hemoglobin: 7.3 g/dL — ABNORMAL LOW (ref 13.0–17.0)
MCH: 28.2 pg (ref 26.0–34.0)
MCHC: 30.8 g/dL (ref 30.0–36.0)
MCV: 91.5 fL (ref 78.0–100.0)
PLATELETS: 270 10*3/uL (ref 150–400)
RBC: 2.59 MIL/uL — AB (ref 4.22–5.81)
RDW: 15.5 % (ref 11.5–15.5)
WBC: 8.1 10*3/uL (ref 4.0–10.5)

## 2016-12-06 LAB — BASIC METABOLIC PANEL
Anion gap: 6 (ref 5–15)
BUN: 36 mg/dL — AB (ref 6–20)
CHLORIDE: 107 mmol/L (ref 101–111)
CO2: 29 mmol/L (ref 22–32)
CREATININE: 0.9 mg/dL (ref 0.61–1.24)
Calcium: 8.2 mg/dL — ABNORMAL LOW (ref 8.9–10.3)
Glucose, Bld: 138 mg/dL — ABNORMAL HIGH (ref 65–99)
POTASSIUM: 3.7 mmol/L (ref 3.5–5.1)
SODIUM: 142 mmol/L (ref 135–145)

## 2016-12-06 LAB — PROTIME-INR
INR: 1.12
Prothrombin Time: 14.4 s (ref 11.4–15.2)

## 2016-12-06 LAB — HEPARIN LEVEL (UNFRACTIONATED): HEPARIN UNFRACTIONATED: 0.31 [IU]/mL (ref 0.30–0.70)

## 2016-12-06 MED ORDER — LEVOTHYROXINE SODIUM 50 MCG PO TABS
50.0000 ug | ORAL_TABLET | Freq: Every day | ORAL | Status: DC
  Administered 2016-12-11 – 2016-12-13 (×3): 50 ug
  Filled 2016-12-06 (×3): qty 1

## 2016-12-06 MED ORDER — FERROUS SULFATE 75 (15 FE) MG/ML PO SOLN
15.0000 mg | Freq: Two times a day (BID) | ORAL | Status: DC
  Filled 2016-12-06 (×5): qty 1

## 2016-12-06 MED ORDER — CHLORHEXIDINE GLUCONATE 0.12 % MT SOLN
OROMUCOSAL | Status: AC
  Filled 2016-12-06: qty 15

## 2016-12-06 MED ORDER — HYDROMORPHONE HCL 1 MG/ML IJ SOLN
0.5000 mg | INTRAMUSCULAR | Status: DC | PRN
  Administered 2016-12-06 – 2016-12-07 (×6): 1 mg via INTRAVENOUS
  Administered 2016-12-07: 0.5 mg via INTRAVENOUS
  Administered 2016-12-07 (×2): 1 mg via INTRAVENOUS
  Administered 2016-12-07 (×3): 0.5 mg via INTRAVENOUS
  Administered 2016-12-08 – 2016-12-13 (×20): 1 mg via INTRAVENOUS
  Filled 2016-12-06: qty 0.5
  Filled 2016-12-06 (×4): qty 1
  Filled 2016-12-06: qty 0.5
  Filled 2016-12-06 (×25): qty 1
  Filled 2016-12-06 (×2): qty 0.5

## 2016-12-06 MED ORDER — PANTOPRAZOLE SODIUM 40 MG IV SOLR
40.0000 mg | Freq: Two times a day (BID) | INTRAVENOUS | Status: DC
  Administered 2016-12-06 – 2016-12-10 (×9): 40 mg via INTRAVENOUS
  Filled 2016-12-06 (×9): qty 40

## 2016-12-06 MED ORDER — METHOCARBAMOL 1000 MG/10ML IJ SOLN
500.0000 mg | Freq: Three times a day (TID) | INTRAVENOUS | Status: DC
  Administered 2016-12-06 – 2016-12-12 (×18): 500 mg via INTRAVENOUS
  Filled 2016-12-06 (×11): qty 5
  Filled 2016-12-06: qty 550
  Filled 2016-12-06 (×2): qty 5
  Filled 2016-12-06: qty 550
  Filled 2016-12-06 (×7): qty 5
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
## 2016-12-06 NOTE — Procedures (Signed)
Extubation Procedure Note  Patient Details:   Name: Caleb ArthursJohn J Dawson DOB: 1950/08/11 MRN: [id number]   Airway Documentation:  Airway 7.5 mm (Active)  Secured at (cm) 25 cm 12/06/2016  3:17 AM  Measured From Lips 12/06/2016  3:17 AM  Secured Location Right 12/06/2016  3:17 AM  Secured By [employer] Tube Holder 12/06/2016  3:17 AM  Tube Holder Repositioned Yes 12/06/2016  3:17 AM  Cuff Pressure (cm H2O) 26 cm H2O 12/05/2016  7:53 PM  Site Condition Dry 12/06/2016  3:17 AM    Evaluation  O2 sats: stable throughout Complications: No apparent complications Patient did tolerate procedure well. Bilateral Breath Sounds: Clear, Diminished   Yes   Placed patient on 4L [place]. Sat 98%. Able to speak and good strong cough. RT will continue to monitor.   Shanera Meske Lajuana RippleM Danaisha Celli 12/06/2016, 9:08 AM
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
## 2016-12-07 ENCOUNTER — Inpatient Hospital Stay (HOSPITAL_COMMUNITY): Payer: BLUE CROSS/BLUE SHIELD | Admitting: Certified Registered Nurse Anesthetist

## 2016-12-07 ENCOUNTER — Encounter (HOSPITAL_COMMUNITY): Admission: EM | Disposition: A | Discharge status: Skilled Nursing Facility | Payer: Self-pay | Source: Home / Self Care

## 2016-12-07 ENCOUNTER — Inpatient Hospital Stay (HOSPITAL_COMMUNITY): Payer: BLUE CROSS/BLUE SHIELD

## 2016-12-07 HISTORY — PX: CLOSED REDUCTION FINGER WITH PERCUTANEOUS PINNING: SHX5612

## 2016-12-07 LAB — CBC
HEMATOCRIT: 21.8 % — AB (ref 39.0–52.0)
HEMATOCRIT: 26.1 % — AB (ref 39.0–52.0)
Hemoglobin: 6.8 g/dL — CL (ref 13.0–17.0)
Hemoglobin: 8.1 g/dL — ABNORMAL LOW (ref 13.0–17.0)
MCH: 28.6 pg (ref 26.0–34.0)
MCH: 27.8 pg (ref 26.0–34.0)
MCHC: 31.2 g/dL (ref 30.0–36.0)
MCHC: 31 g/dL (ref 30.0–36.0)
MCV: 91.6 fL (ref 78.0–100.0)
MCV: 89.7 fL (ref 78.0–100.0)
Platelets: 361 10*3/uL (ref 150–400)
Platelets: 367 10*3/uL (ref 150–400)
RBC: 2.38 MIL/uL — ABNORMAL LOW (ref 4.22–5.81)
RBC: 2.91 MIL/uL — ABNORMAL LOW (ref 4.22–5.81)
RDW: 15.5 % (ref 11.5–15.5)
RDW: 16.1 % — ABNORMAL HIGH (ref 11.5–15.5)
WBC: 10.3 10*3/uL (ref 4.0–10.5)
WBC: 12.1 10*3/uL — AB (ref 4.0–10.5)

## 2016-12-07 LAB — BASIC METABOLIC PANEL
ANION GAP: 8 (ref 5–15)
BUN: 31 mg/dL — ABNORMAL HIGH (ref 6–20)
CO2: 25 mmol/L (ref 22–32)
Calcium: 8.4 mg/dL — ABNORMAL LOW (ref 8.9–10.3)
Chloride: 111 mmol/L (ref 101–111)
Creatinine, Ser: 0.84 mg/dL (ref 0.61–1.24)
GFR calc Af Amer: >60 mL/min (ref >60)
GLUCOSE: 137 mg/dL — AB (ref 65–99)
POTASSIUM: 3.7 mmol/L (ref 3.5–5.1)
Sodium: 144 mmol/L (ref 135–145)

## 2016-12-07 LAB — HEPARIN LEVEL (UNFRACTIONATED): Heparin Unfractionated: <0.1 IU/mL — ABNORMAL LOW (ref 0.30–0.70)

## 2016-12-07 LAB — PREPARE RBC (CROSSMATCH)

## 2016-12-07 LAB — PROTIME-INR
INR: 1.09
Prothrombin Time: 14.2 seconds (ref 11.4–15.2)

## 2016-12-07 SURGERY — CLOSED REDUCTION, FINGER, WITH PERCUTANEOUS PINNING
Anesthesia: General | Site: Hand | Laterality: Right

## 2016-12-07 MED ORDER — CEFAZOLIN SODIUM-DEXTROSE 2-4 GM/100ML-% IV SOLN
2.0000 g | INTRAVENOUS | Status: AC
  Administered 2016-12-07: 2 g via INTRAVENOUS
  Filled 2016-12-07: qty 100

## 2016-12-07 MED ORDER — CHLORHEXIDINE GLUCONATE 4 % EX LIQD
60.0000 mL | Freq: Once | CUTANEOUS | Status: DC
  Filled 2016-12-07: qty 60

## 2016-12-07 MED ORDER — SODIUM CHLORIDE 0.9 % IV SOLN: Freq: Once | INTRAVENOUS | Status: DC

## 2016-12-07 MED ORDER — FENTANYL CITRATE (PF) 250 MCG/5ML IJ SOLN
INTRAMUSCULAR | Status: AC
  Filled 2016-12-07: qty 5

## 2016-12-07 MED ORDER — MIDAZOLAM HCL 2 MG/2ML IJ SOLN
INTRAMUSCULAR | Status: AC
  Filled 2016-12-07: qty 2

## 2016-12-07 MED ORDER — BUPIVACAINE HCL (PF) 0.25 % IJ SOLN
INTRAMUSCULAR | Status: DC | PRN
  Administered 2016-12-07: 8 mL

## 2016-12-07 MED ORDER — PROPOFOL 10 MG/ML IV BOLUS
INTRAVENOUS | Status: AC
  Filled 2016-12-07: qty 20

## 2016-12-07 MED ORDER — PROPOFOL 500 MG/50ML IV EMUL
INTRAVENOUS | Status: AC
  Filled 2016-12-07: qty 100

## 2016-12-07 MED ORDER — HEPARIN (PORCINE) IN NACL 100-0.45 UNIT/ML-% IJ SOLN
2400.0000 [IU]/h | INTRAMUSCULAR | Status: DC
  Administered 2016-12-08: 1850 [IU]/h via INTRAVENOUS
  Administered 2016-12-08: 2100 [IU]/h via INTRAVENOUS
  Administered 2016-12-09: 2250 [IU]/h via INTRAVENOUS
  Administered 2016-12-09: 2400 [IU]/h via INTRAVENOUS
  Filled 2016-12-07 (×5): qty 250

## 2016-12-07 MED ORDER — FENTANYL CITRATE (PF) 100 MCG/2ML IJ SOLN
INTRAMUSCULAR | Status: DC | PRN
  Administered 2016-12-07 (×4): 25 ug via INTRAVENOUS
  Administered 2016-12-07: 50 ug via INTRAVENOUS
  Administered 2016-12-07: 25 ug via INTRAVENOUS

## 2016-12-07 MED ORDER — ROCURONIUM BROMIDE 50 MG/5ML IV SOLN
INTRAVENOUS | Status: AC
  Filled 2016-12-07: qty 1

## 2016-12-07 MED ORDER — SUCCINYLCHOLINE CHLORIDE 200 MG/10ML IV SOSY
PREFILLED_SYRINGE | INTRAVENOUS | Status: AC
  Filled 2016-12-07: qty 10

## 2016-12-07 MED ORDER — POVIDONE-IODINE 10 % EX SWAB: 2.0000 "application " | Freq: Once | CUTANEOUS | Status: DC

## 2016-12-07 MED ORDER — PROMETHAZINE HCL 25 MG/ML IJ SOLN: 6.2500 mg | INTRAMUSCULAR | Status: DC | PRN

## 2016-12-07 MED ORDER — FENTANYL CITRATE (PF) 100 MCG/2ML IJ SOLN: 25.0000 ug | INTRAMUSCULAR | Status: DC | PRN

## 2016-12-07 MED ORDER — LIDOCAINE 2% (20 MG/ML) 5 ML SYRINGE
INTRAMUSCULAR | Status: DC | PRN
  Administered 2016-12-07: 70 mg via INTRAVENOUS

## 2016-12-07 MED ORDER — LIDOCAINE HCL (CARDIAC) 20 MG/ML IV SOLN
INTRAVENOUS | Status: AC
  Filled 2016-12-07: qty 5

## 2016-12-07 MED ORDER — KETOROLAC TROMETHAMINE 30 MG/ML IJ SOLN: 30.0000 mg | Freq: Once | INTRAMUSCULAR | Status: DC | PRN

## 2016-12-07 MED ORDER — DEXAMETHASONE SODIUM PHOSPHATE 10 MG/ML IJ SOLN
INTRAMUSCULAR | Status: DC | PRN
  Administered 2016-12-07: 10 mg via INTRAVENOUS

## 2016-12-07 MED ORDER — BUPIVACAINE HCL (PF) 0.25 % IJ SOLN
INTRAMUSCULAR | Status: AC
  Filled 2016-12-07: qty 30

## 2016-12-07 MED ORDER — PHENYLEPHRINE HCL 10 MG/ML IJ SOLN
INTRAMUSCULAR | Status: DC | PRN
  Administered 2016-12-07: 80 ug via INTRAVENOUS

## 2016-12-07 MED ORDER — ONDANSETRON HCL 4 MG/2ML IJ SOLN
INTRAMUSCULAR | Status: DC | PRN
  Administered 2016-12-07: 4 mg via INTRAVENOUS

## 2016-12-07 MED ORDER — LACTATED RINGERS IV SOLN
INTRAVENOUS | Status: DC | PRN
  Administered 2016-12-07: 11:00:00 via INTRAVENOUS

## 2016-12-07 MED ORDER — PROPOFOL 10 MG/ML IV BOLUS
INTRAVENOUS | Status: DC | PRN
  Administered 2016-12-07: 120 mg via INTRAVENOUS

## 2016-12-07 SURGICAL SUPPLY — 50 items
BANDAGE ACE 3X5.8 VEL STRL LF (GAUZE/BANDAGES/DRESSINGS) ×3 IMPLANT
BANDAGE ACE 4X5 VEL STRL LF (GAUZE/BANDAGES/DRESSINGS) ×3 IMPLANT
BANDAGE COBAN STERILE 2 (GAUZE/BANDAGES/DRESSINGS) IMPLANT
BENZOIN TINCTURE PRP APPL 2/3 (GAUZE/BANDAGES/DRESSINGS) ×3 IMPLANT
BLADE CLIPPER SURG (BLADE) ×3 IMPLANT
BNDG ESMARK 4X9 LF (GAUZE/BANDAGES/DRESSINGS) ×3 IMPLANT
BNDG GAUZE ELAST 4 BULKY (GAUZE/BANDAGES/DRESSINGS) ×3 IMPLANT
CHLORAPREP W/TINT 26ML (MISCELLANEOUS) ×3 IMPLANT
CLOSURE WOUND 1/2 X4 (GAUZE/BANDAGES/DRESSINGS) ×1
CORDS BIPOLAR (ELECTRODE) ×3 IMPLANT
COVER SURGICAL LIGHT HANDLE (MISCELLANEOUS) ×3 IMPLANT
CUFF TOURNIQUET SINGLE 18IN (TOURNIQUET CUFF) IMPLANT
CUFF TOURNIQUET SINGLE 24IN (TOURNIQUET CUFF) IMPLANT
DRAPE C-ARM MINI 42X72 WSTRAPS (DRAPES) ×3 IMPLANT
DRAPE OEC MINIVIEW 54X84 (DRAPES) ×3 IMPLANT
DRAPE SURG 17X23 STRL (DRAPES) ×3 IMPLANT
DRSG EMULSION OIL 3X3 NADH (GAUZE/BANDAGES/DRESSINGS) ×3 IMPLANT
DRSG MEPILEX BORDER 4X4 (GAUZE/BANDAGES/DRESSINGS) ×3 IMPLANT
GAUZE SPONGE 4X4 12PLY STRL (GAUZE/BANDAGES/DRESSINGS) ×3 IMPLANT
GAUZE XEROFORM 1X8 LF (GAUZE/BANDAGES/DRESSINGS) ×3 IMPLANT
GAUZE XEROFORM 5X9 LF (GAUZE/BANDAGES/DRESSINGS) ×3 IMPLANT
GLOVE BIO SURGEON STRL SZ7.5 (GLOVE) ×3 IMPLANT
GLOVE BIOGEL PI IND STRL 8 (GLOVE) ×1 IMPLANT
GLOVE BIOGEL PI INDICATOR 8 (GLOVE) ×2
GOWN STRL REUS W/ TWL LRG LVL3 (GOWN DISPOSABLE) ×3 IMPLANT
GOWN STRL REUS W/TWL LRG LVL3 (GOWN DISPOSABLE) ×6
KIT BASIN OR (CUSTOM PROCEDURE TRAY) ×3 IMPLANT
KIT ROOM TURNOVER OR (KITS) ×3 IMPLANT
KWIRE 1.1 (Wire) ×12 IMPLANT
MANIFOLD NEPTUNE II (INSTRUMENTS) ×3 IMPLANT
NEEDLE HYPO 25GX1X1/2 BEV (NEEDLE) ×3 IMPLANT
NS IRRIG 1000ML POUR BTL (IV SOLUTION) ×3 IMPLANT
PACK ORTHO EXTREMITY (CUSTOM PROCEDURE TRAY) ×3 IMPLANT
PAD ARMBOARD 7.5X6 YLW CONV (MISCELLANEOUS) ×6 IMPLANT
PAD CAST 4YDX4 CTTN HI CHSV (CAST SUPPLIES) ×1 IMPLANT
PADDING CAST COTTON 4X4 STRL (CAST SUPPLIES) ×2
SPLINT PLASTER EXTRA FAST 3X15 (CAST SUPPLIES) ×2
SPLINT PLASTER GYPS XFAST 3X15 (CAST SUPPLIES) ×1 IMPLANT
STRIP CLOSURE SKIN 1/2X4 (GAUZE/BANDAGES/DRESSINGS) ×2 IMPLANT
SUCTION FRAZIER HANDLE 10FR (MISCELLANEOUS) ×2
SUCTION TUBE FRAZIER 10FR DISP (MISCELLANEOUS) ×1 IMPLANT
SUT ETHILON 4 0 P 3 18 (SUTURE) IMPLANT
SUT PROLENE 4 0 P 3 18 (SUTURE) IMPLANT
SYR CONTROL 10ML LL (SYRINGE) ×3 IMPLANT
TOWEL OR 17X24 6PK STRL BLUE (TOWEL DISPOSABLE) ×3 IMPLANT
TOWEL OR 17X26 10 PK STRL BLUE (TOWEL DISPOSABLE) ×3 IMPLANT
TUBE CONNECTING 12'X1/4 (SUCTIONS) ×1
TUBE CONNECTING 12X1/4 (SUCTIONS) ×2 IMPLANT
TUBE FEEDING ENTERAL 5FR 16IN (TUBING) IMPLANT
WATER STERILE IRR 1000ML POUR (IV SOLUTION) ×3 IMPLANT
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
## 2016-12-07 NOTE — Progress Notes (Signed)
Follow up - Trauma and Critical Care  Patient Details:    Caleb Dawson is an 66 y.o. male.  Lines/tubes Extubated yesterday, Left IJ removed, NG/OG out.  : Airway 7.5 mm (Active)  Secured at (cm) 25 cm 12/06/2016  3:17 AM  Measured From Lips 12/06/2016  3:17 AM  Secured Location Right 12/06/2016  3:17 AM  Secured By [employer] 12/06/2016  3:17 AM  Tube Holder Repositioned Yes 12/06/2016  3:17 AM  Cuff Pressure (cm H2O) 26 cm H2O 12/05/2016  7:53 PM  Site Condition Dry 12/06/2016  3:17 AM     CVC Triple Lumen 11/28/16 Left Internal jugular (Active)  Indication for Insertion or Continuance of Line Prolonged intravenous therapies 12/05/2016  8:00 PM  Site Assessment Clean;Dry;Intact 12/05/2016  8:00 PM  Proximal Lumen Status Infusing 12/05/2016  8:00 PM  Medial Lumen Status Infusing 12/05/2016  8:00 PM  Distal Lumen Status In-line blood sampling system in place 12/05/2016  8:00 PM  Dressing Type Transparent 12/05/2016  8:00 PM  Dressing Status Dry;Intact;Antimicrobial disc in place 12/05/2016  8:00 PM  Line Care Connections checked and tightened 12/05/2016  8:00 PM  Dressing Intervention Dressing changed;Antimicrobial disc changed 12/01/2016 12:00 PM  Dressing Change Due 12/08/16 12/05/2016  8:00 PM     NG/OG Tube Orogastric (Active)  Site Assessment Clean;Intact 12/05/2016  8:00 PM  Ongoing Placement Verification No acute changes, not attributed to clinical condition;No change in respiratory status 12/05/2016  8:00 PM  Status Infusing tube feed 12/05/2016  8:00 PM  Intake (mL) 300 mL 12/04/2016 10:00 PM     Urethral Catheter Dr. Lindie Spruce (Active)  Indication for Insertion or Continuance of Catheter Unstable spinal/crush injuries;Peri-operative use for selective surgical procedure 12/05/2016  8:00 PM  Site Assessment Clean;Intact 12/05/2016  8:00 PM  Catheter Maintenance Bag below level of bladder;Catheter secured;Drainage bag/tubing not touching floor;Insertion date on drainage bag;No  dependent loops;Seal intact 12/05/2016  8:00 PM  Collection Container Standard drainage bag 12/05/2016  8:00 PM  Securement Method Securing device (Describe) 12/05/2016  8:00 PM  Urinary Catheter Interventions Unclamped 12/05/2016  8:00 PM  Output (mL) 400 mL 12/06/2016  6:00 AM    Microbiology/Sepsis markers: Results for orders placed or performed during the hospital encounter of 11/28/16  MRSA PCR Screening     Status: None   Collection Time: 11/28/16  5:01 PM  Result Value Ref Range Status   MRSA by PCR NEGATIVE NEGATIVE Final    Comment:        The GeneXpert MRSA Assay (FDA approved for NASAL specimens only), is one component of a comprehensive MRSA colonization surveillance program. It is not intended to diagnose MRSA infection nor to guide or monitor treatment for MRSA infections.   Culture, respiratory (NON-Expectorated)     Status: None (Preliminary result)   Collection Time: 12/06/16  8:45 AM  Result Value Ref Range Status   Specimen Description TRACHEAL ASPIRATE  Final   Special Requests Normal  Final   Gram Stain   Final    FEW WBC PRESENT, PREDOMINANTLY PMN RARE GRAM POSITIVE COCCI RARE GRAM POSITIVE RODS RARE GRAM NEGATIVE RODS    Culture PENDING  Incomplete   Report Status PENDING  Incomplete    Anti-infectives:  Anti-infectives    Start     Dose/Rate Route Frequency Ordered Stop   12/07/16 0730  ceFAZolin (ANCEF) IVPB 2g/100 mL premix     2 g 200 mL/hr over 30 Minutes Intravenous On call to O.R. 12/07/16 0981 12/08/16 0559  11/28/16 2200  ceFAZolin (ANCEF) IVPB 1 g/50 mL premix     1 g 100 mL/hr over 30 Minutes Intravenous Every 8 hours 11/28/16 1707 11/30/16 1439   11/28/16 1715  ceFAZolin (ANCEF) IVPB 2g/100 mL premix  Status:  Discontinued     2 g 200 mL/hr over 30 Minutes Intravenous Every 8 hours 11/28/16 1707 11/28/16 1726      Best Practice/Protocols:  VTE Prophylaxis: Heparin (drip) and Mechanical GI Prophylaxis: Proton Pump  Inhibitor Continous Sedation- stopped   Consults: Treatment Team:  Myrene Galas, MD Betha Loa, MD    Events:  Subjective:    Overnight Issues: No acute events. Mentating well, at baseline per family at bedside. Some abdominal pain/soreness.   Objective:  Vital signs for last 24 hours: Temp:  [97.7 F (36.5 C)-99.4 F (37.4 C)] 99.4 F (37.4 C) (07/21 0915) Pulse Rate:  [29-100] 86 (07/21 0900) Resp:  [11-32] 20 (07/21 0900) BP: (129-161)/(58-105) 129/74 (07/21 0900) SpO2:  [90 %-100 %] 92 % (07/21 0900)  Hemodynamic parameters for last 24 hours:    Intake/Output from previous day: 07/20 0701 - 07/21 0700 In: 1265.1 [I.V.:770.1; NG/GT:330; IV Piggyback:165] Out: 1650 [Urine:1650]  Intake/Output this shift: Total I/O In: 129.5 [I.V.:9.5; Blood:120] Out: -   Vent settings for last 24 hours:    Physical Exam:  Gen: alert, answering questions, follows commands Neuro_ follows commands all extremities Resp clear to auscultation bilaterally, CXR looks OK today CVS: Regular rate and rhythm, mechanical valve click present GI: soft, nontender, nondistended, no mass or palpable hematoma GU foley in place, large scrotal edema Extremities: bilateral LE splints and right wrist splint. Digits are warm and dry wit hbrisk cap refill.    Results for orders placed or performed during the hospital encounter of 11/28/16 (from the past 24 hour(s))  CBC     Status: Abnormal   Collection Time: 12/07/16  6:08 AM  Result Value Ref Range   WBC 10.3 4.0 - 10.5 K/uL   RBC 2.38 (L) 4.22 - 5.81 MIL/uL   Hemoglobin 6.8 (LL) 13.0 - 17.0 g/dL   HCT 14.7 (L) 82.9 - 56.2 %   MCV 91.6 78.0 - 100.0 fL   MCH 28.6 26.0 - 34.0 pg   MCHC 31.2 30.0 - 36.0 g/dL   RDW 13.0 86.5 - 78.4 %   Platelets 361 150 - 400 K/uL  Heparin level (unfractionated)     Status: Abnormal   Collection Time: 12/07/16  6:08 AM  Result Value Ref Range   Heparin Unfractionated <0.10 (L) 0.30 - 0.70 IU/mL   Protime-INR     Status: None   Collection Time: 12/07/16  6:08 AM  Result Value Ref Range   Prothrombin Time 14.2 11.4 - 15.2 seconds   INR 1.09   Basic metabolic panel     Status: Abnormal   Collection Time: 12/07/16  6:08 AM  Result Value Ref Range   Sodium 144 135 - 145 mmol/L   Potassium 3.7 3.5 - 5.1 mmol/L   Chloride 111 101 - 111 mmol/L   CO2 25 22 - 32 mmol/L   Glucose, Bld 137 (H) 65 - 99 mg/dL   BUN 31 (H) 6 - 20 mg/dL   Creatinine, Ser 6.96 0.61 - 1.24 mg/dL   Calcium 8.4 (L) 8.9 - 10.3 mg/dL   GFR calc non Af Amer >60 >60 mL/min   GFR calc Af Amer >60 >60 mL/min   Anion gap 8 5 - 15  Type and screen [facility]     Status: None (Preliminary result)   Collection Time: 12/07/16  7:41 AM  Result Value Ref Range   ABO/RH(D) A POS    Antibody Screen NEG    Sample Expiration 12/10/2016    Unit Number [id number]    Blood Component Type RED CELLS,LR    Unit division 00    Status of Unit ISSUED    Transfusion Status OK TO TRANSFUSE    Crossmatch Result Compatible   Prepare RBC     Status: None   Collection Time: 12/07/16  7:41 AM  Result Value Ref Range   Order Confirmation ORDER PROCESSED BY BLOOD BANK      Assessment/Plan:   NEURO  At baseline per family at bedside.    Plan: Multimodal pain control  PULM  Stable post extubation, CXR reviewed.    Plan: Aggressive pulm toilet   CARDIO  H/o mechanical AVR.  On heparin drip for heart valve.    Plan: continue tele  RENAL  BUN improving. Good UOP and Cr normal.   Plan: Continue foley for GU hematoma/edema  GI  Abdomen benign. NPO for surgery today.    Plan: FOBT not resulted yet  ID  No known infectious source   Plan: No antibiotics for now  HEME  Anemia acute blood loss anemia and anemia of critical illness)   Plan: 1u PRBC today  ENDO No known issues   Plan: CPM  Global Issues  Doing well all things considered. To OR for right wrist today.  More trips to OR for pelvis this  week, Advance diet to clears after OR today    LOS: 9 days   Additional comments:I reviewed the patient's new clinical lab test results. cbc/bmet, CXR  Critical Care Total Time*: 30 Minutes  Wenonah Milo A Raquan Iannone 12/07/2016  *Care during the described time interval was provided by me and/or other providers on the critical care team.  I have reviewed this patient's available data, including medical history, events of note, physical examination and test results as part of my evaluation.

## 2016-12-07 NOTE — Progress Notes (Signed)
Per Dr. Okey Dupreose patient needs unit of blood prior to coming to preop. RN caring for patient notified.

## 2016-12-07 NOTE — Brief Op Note (Signed)
11/28/2016 - 12/07/2016  11:59 AM  PATIENT:  Caleb ArthursJohn J Dawson  66 y.o. male  PRE-OPERATIVE DIAGNOSIS:  RIGHT INDEX/LONG/RING/SMALL CMC DISLOCATIONS  POST-OPERATIVE DIAGNOSIS:  RIGHT INDEX/LONG/RING/SMALL CMC DISLOCATIONS  PROCEDURE:  CLOSED REDUCTION AND PINNING RIGHT INDEX/LONG/RING/SMALL [facility] DIS[facility]ATIONS  SURGEON:  Surgeon(s) and Role:    * Betha LoaKuzma, Takeira Yanes, MD - Primary  PHYSICIAN ASSISTANT:   ASSISTANTS: none   ANESTHESIA:   general  EBL:  Total I/O In: 1099.5 [I.V.:309.5; Blood:790] Out: 590 [Urine:580; Blood:10]  BLOOD ADMINISTERED:none  DRAINS: none   LOCAL MEDICATIONS USED:  MARCAINE     SPECIMEN:  No Specimen  DISPOSITION OF SPECIMEN:  N/A  COUNTS:  YES  TOURNIQUET:  Right arm: 22 minutes at 250 mmHg  DICTATION: .Other Dictation: Dictation Number [id number]  PLAN OF CARE: return to floor  PATIENT DISPOSITION:  PACU - hemodynamically stable.   Delay start of Pharmacological VTE agent (>24hrs) due to surgical blood loss or risk of bleeding: no

## 2016-12-07 NOTE — Progress Notes (Addendum)
Subjective: Day of Surgery Procedure(s) (LRB): CLOSED REDUCTION FINGER WITH PERCUTANEOUS PINNING (Right) Seen preoperatively.  Plan for fixation right hand.  Patient alert and conversant.  Objective: Vital signs in last 24 hours: Temp:  [97.7 F (36.5 C)-99.4 F (37.4 C)] 98.1 F (36.7 C) (07/21 1020) Pulse Rate:  [29-100] 86 (07/21 1020) Resp:  [11-32] 21 (07/21 1020) BP: (129-161)/(58-119) 139/119 (07/21 1020) SpO2:  [90 %-100 %] 94 % (07/21 1020)  Intake/Output from previous day: 07/20 0701 - 07/21 0700 In: 1265.1 [I.V.:770.1; NG/GT:330; IV Piggyback:165] Out: 1650 [Urine:1650] Intake/Output this shift: Total I/O In: 464.5 [I.V.:9.5; Blood:455] Out: -    Recent Labs  12/05/16 0416 12/06/16 0417 12/07/16 0608  HGB 7.5* 7.3* 6.8*    Recent Labs  12/06/16 0417 12/07/16 0608  WBC 8.1 10.3  RBC 2.59* 2.38*  HCT 23.7* 21.8*  PLT 270 361    Recent Labs  12/06/16 0417 12/07/16 0608  NA 142 144  K 3.7 3.7  CL 107 111  CO2 29 25  BUN 36* 31*  CREATININE 0.90 0.84  GLUCOSE 138* 137*  CALCIUM 8.2* 8.4*    Recent Labs  12/06/16 0417 12/07/16 0608  INR 1.12 1.09    Moving digits.  Intact capillary refill.  Assessment/Plan: Day of Surgery Procedure(s) (LRB): CLOSED REDUCTION FINGER WITH PERCUTANEOUS PINNING (Right) Discussed plan again with patient and friend who is with him.  Risks, benefits and alternatives of surgery were discussed including risks of blood loss, infection, damage to nerves/vessels/tendons/ligament/bone, failure of surgery, need for additional surgery, complication with wound healing, nonunion, malunion, stiffness.  He voiced understanding of these risks and elected to proceed. Also discussed this with his brother yesterday afternoon and he agreed with the plan as well.  Called brother yesterday evening and this morning regarding plan for surgery today and to get additional formal surgical consent from him as well, but no answer.  Message  left on voicemail.  Patient alert and conversant and seems appropriate to sign his own consent.  Taya Ashbaugh R 12/07/2016, 11:01 AM
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
## 2016-12-07 NOTE — Anesthesia Procedure Notes (Addendum)
Procedure Name: LMA Insertion Date/Time: 12/07/2016 11:12 AM Performed by: Orvilla FusATO, Shey Bartmess A Pre-anesthesia Checklist: Patient identified, Emergency Drugs available, Suction available and Patient being monitored Patient Re-evaluated:Patient Re-evaluated prior to induction Oxygen Delivery Method: Circle System Utilized Preoxygenation: Pre-oxygenation with 100% oxygen Induction Type: IV induction Ventilation: Mask ventilation without difficulty LMA: LMA inserted LMA Size: 5.0 Number of attempts: 1 Placement Confirmation: positive ETCO2 Tube secured with: Tape Dental Injury: Teeth and Oropharynx as per pre-operative assessment

## 2016-12-07 NOTE — Anesthesia Preprocedure Evaluation (Signed)
Anesthesia Evaluation  Patient identified by MRN, date of birth, ID band Patient awake    Reviewed: Allergy & Precautions, NPO status , Patient's Chart, lab work & pertinent test results  Airway Mallampati: II  TM Distance: >3 FB Neck ROM: Full    Dental no notable dental hx.    Pulmonary neg pulmonary ROS,    Pulmonary exam normal breath sounds clear to auscultation       Cardiovascular negative cardio ROS  + Valvular Problems/Murmurs  Rhythm:Regular Rate:Normal + Systolic murmurs S/p MVR   Neuro/Psych negative neurological ROS  negative psych ROS   GI/Hepatic negative GI ROS, Neg liver ROS,   Endo/Other  Hypothyroidism   Renal/GU negative Renal ROS  negative genitourinary   Musculoskeletal negative musculoskeletal ROS (+)   Abdominal   Peds negative pediatric ROS (+)  Hematology  (+) anemia ,   Anesthesia Other Findings   Reproductive/Obstetrics negative OB ROS                             Anesthesia Physical Anesthesia Plan  ASA: III  Anesthesia Plan: General   Post-op Pain Management:    Induction: Intravenous  PONV Risk Score and Plan: 1 and Ondansetron, Dexamethasone and Treatment may vary due to age or medical condition  Airway Management Planned: LMA  Additional Equipment:   Intra-op Plan:   Post-operative Plan: Extubation in OR  Informed Consent: I have reviewed the patients History and Physical, chart, labs and discussed the procedure including the risks, benefits and alternatives for the proposed anesthesia with the patient or authorized representative who has indicated his/her understanding and acceptance.   Dental advisory given  Plan Discussed with: CRNA and Surgeon  Anesthesia Plan Comments:         Anesthesia Quick Evaluation

## 2016-12-07 NOTE — Transfer of Care (Signed)
Immediate Anesthesia Transfer of Care Note  Patient: Caleb ArthursJohn J Insco  Procedure(s) Performed: Procedure(s): CLOSED REDUCTION FINGER WITH PERCUTANEOUS PINNING (Right)  Patient Location: PACU  Anesthesia Type:General  Level of Consciousness: awake, alert  and oriented  Airway & Oxygen Therapy: Patient Spontanous Breathing and Patient connected to nasal cannula oxygen  Post-op Assessment: Report given to RN, Post -op Vital signs reviewed and stable and Patient moving all extremities  Post vital signs: Reviewed and stable  Last Vitals:  Vitals:   12/07/16 1000 12/07/16 1020  BP: (!) 146/92 (!) 139/119  Pulse: 87 86  Resp:  (!) 21  Temp:  36.7 C    Last Pain:  Vitals:   12/07/16 1020  TempSrc: Oral  PainSc:          Complications: No apparent anesthesia complications

## 2016-12-07 NOTE — Anesthesia Postprocedure Evaluation (Signed)
Anesthesia Post Note  Patient: Caleb ArthursJohn J Lichtenberger  Procedure(s) Performed: Procedure(s) (LRB): CLOSED REDUCTION FINGER WITH PERCUTANEOUS PINNING (Right)     Patient location during evaluation: PACU Anesthesia Type: General Level of consciousness: awake and alert Pain management: pain level controlled Vital Signs Assessment: post-procedure vital signs reviewed and stable Respiratory status: spontaneous breathing, nonlabored ventilation, respiratory function stable and patient connected to nasal cannula oxygen Cardiovascular status: blood pressure returned to baseline and stable Postop Assessment: no signs of nausea or vomiting Anesthetic complications: no    Last Vitals:  Vitals:   12/07/16 1212 12/07/16 1217  BP:  132/81  Pulse: 82 84  Resp: 14 20  Temp:      Last Pain:  Vitals:   12/07/16 1020  TempSrc: Oral  PainSc:                  Kinzey Sheriff S

## 2016-12-07 NOTE — Op Note (Signed)
[id number] 

## 2016-12-07 NOTE — Progress Notes (Signed)
PT Cancellation Note  Patient Details Name: Caleb ArthursJohn J Dawson MRN: [id number] DOB: [birth year]   Cancelled Treatment:    Reason Eval/Treat Not Completed: Patient at procedure or test/unavailable;Patient not medically ready (to OR)   Fabio AsaDevon J Meia Emley 12/07/2016, 8:14 AM Charlotte Crumbevon Neely Cecena, PT DPT [facility] [phone]
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
## 2016-12-07 NOTE — Progress Notes (Signed)
SLP Cancellation Note  Patient Details Name: Caleb ArthursJohn J Selmer MRN: [id number] DOB: 1950/11/08   Cancelled treatment:       Reason Eval/Treat Not Completed: Other (comment) (Pt NPO for upcoming procedure, SLP to continue to follow for PO readiness following procedure.   Marcene Duoshelsea Sumney MA, CCC-SLP Acute Care Speech Language Pathologist    Kennieth RadChelsea E Sumney 12/07/2016, 8:25 AM

## 2016-12-08 LAB — CULTURE, RESPIRATORY W GRAM STAIN
Culture: NORMAL
Special Requests: NORMAL

## 2016-12-08 LAB — CBC
HCT: 22.3 % — ABNORMAL LOW (ref 39.0–52.0)
Hemoglobin: 7 g/dL — ABNORMAL LOW (ref 13.0–17.0)
MCH: 28.6 pg (ref 26.0–34.0)
MCHC: 31.4 g/dL (ref 30.0–36.0)
MCV: 91 fL (ref 78.0–100.0)
Platelets: 380 10*3/uL (ref 150–400)
RBC: 2.45 MIL/uL — ABNORMAL LOW (ref 4.22–5.81)
RDW: 17.1 % — AB (ref 11.5–15.5)
WBC: 11.3 10*3/uL — ABNORMAL HIGH (ref 4.0–10.5)

## 2016-12-08 LAB — BASIC METABOLIC PANEL
Anion gap: 8 (ref 5–15)
BUN: 32 mg/dL — ABNORMAL HIGH (ref 6–20)
CALCIUM: 8.2 mg/dL — AB (ref 8.9–10.3)
CHLORIDE: 114 mmol/L — AB (ref 101–111)
CO2: 25 mmol/L (ref 22–32)
CREATININE: 0.87 mg/dL (ref 0.61–1.24)
GFR calc Af Amer: >60 mL/min (ref >60)
GFR calc non Af Amer: >60 mL/min (ref >60)
Glucose, Bld: 136 mg/dL — ABNORMAL HIGH (ref 65–99)
Potassium: 3.7 mmol/L (ref 3.5–5.1)
Sodium: 147 mmol/L — ABNORMAL HIGH (ref 135–145)

## 2016-12-08 LAB — HEPARIN LEVEL (UNFRACTIONATED)
HEPARIN UNFRACTIONATED: 0.1 [IU]/mL — AB (ref 0.30–0.70)
HEPARIN UNFRACTIONATED: 0.26 [IU]/mL — AB (ref 0.30–0.70)
Heparin Unfractionated: 0.14 IU/mL — ABNORMAL LOW (ref 0.30–0.70)

## 2016-12-08 LAB — CULTURE, RESPIRATORY

## 2016-12-08 LAB — PROTIME-INR
INR: 1.24
PROTHROMBIN TIME: 15.7 s — AB (ref 11.4–15.2)

## 2016-12-08 LAB — PREPARE RBC (CROSSMATCH)

## 2016-12-08 LAB — OCCULT BLOOD X 1 CARD TO LAB, STOOL: Fecal Occult Bld: NEGATIVE

## 2016-12-08 MED ORDER — SODIUM CHLORIDE 0.9 % IV SOLN
0.4000 ug/kg/h | INTRAVENOUS | Status: DC
  Administered 2016-12-08: 0.7 ug/kg/h via INTRAVENOUS
  Administered 2016-12-08: 0.4 ug/kg/h via INTRAVENOUS
  Filled 2016-12-08 (×3): qty 2

## 2016-12-08 MED ORDER — ACETAMINOPHEN 10 MG/ML IV SOLN
1000.0000 mg | Freq: Four times a day (QID) | INTRAVENOUS | Status: AC
  Administered 2016-12-08 – 2016-12-09 (×4): 1000 mg via INTRAVENOUS
  Filled 2016-12-08 (×4): qty 100

## 2016-12-08 MED ORDER — CHLORHEXIDINE GLUCONATE 0.12 % MT SOLN
OROMUCOSAL | Status: AC
  Administered 2016-12-08: 20:00:00
  Filled 2016-12-08: qty 15

## 2016-12-08 MED ORDER — FUROSEMIDE 10 MG/ML IJ SOLN
20.0000 mg | Freq: Once | INTRAMUSCULAR | Status: AC
  Administered 2016-12-08: 20 mg via INTRAVENOUS
  Filled 2016-12-08: qty 2

## 2016-12-08 MED ORDER — SODIUM CHLORIDE 0.9 % IV SOLN
Freq: Once | INTRAVENOUS | Status: DC
Start: 2016-12-08 — End: 2016-12-10

## 2016-12-08 MED ORDER — DEXMEDETOMIDINE HCL IN NACL 400 MCG/100ML IV SOLN
0.4000 ug/kg/h | INTRAVENOUS | Status: DC
  Administered 2016-12-09: 0.3 ug/kg/h via INTRAVENOUS
  Administered 2016-12-10 (×2): 0.4 ug/kg/h via INTRAVENOUS
  Administered 2016-12-10: 0.5 ug/kg/h via INTRAVENOUS
  Filled 2016-12-08 (×3): qty 100

## 2016-12-08 MED ORDER — CHLORHEXIDINE GLUCONATE 0.12 % MT SOLN
OROMUCOSAL | Status: AC
  Filled 2016-12-08: qty 15
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
## 2016-12-08 NOTE — Progress Notes (Signed)
Orthopedic Tech Progress Note Patient Details:  Caleb ArthursJohn J Dawson [birth year] [id number]  Ortho Devices Type of Ortho Device: Arm sling (sling arm elevator) Ortho Device/Splint Location: rue Ortho Device/Splint Interventions: Application   Nikki DomCrawford, Caleb Dawson 12/08/2016, 11:59 AM Viewed order from doctor's order list

## 2016-12-09 ENCOUNTER — Encounter (HOSPITAL_COMMUNITY): Payer: Self-pay | Admitting: Orthopedic Surgery

## 2016-12-09 LAB — BPAM RBC
BLOOD PRODUCT EXPIRATION DATE: 201807312359
Blood Product Expiration Date: 201807312359
Blood Product Expiration Date: 201807312359
Blood Product Expiration Date: 201808042359
ISSUE DATE / TIME: 201807210841
ISSUE DATE / TIME: 201807221302
ISSUE DATE / TIME: 201807221025
ISSUE DATE / TIME: 201807221624
UNIT TYPE AND RH: 6200
UNIT TYPE AND RH: 6200
UNIT TYPE AND RH: 6200
Unit Type and Rh: 6200

## 2016-12-09 LAB — TYPE AND SCREEN
ABO/RH(D): A POS
Antibody Screen: NEGATIVE
UNIT DIVISION: 0
Unit division: 0
Unit division: 0
Unit division: 0

## 2016-12-09 LAB — CBC
HCT: 29 % — ABNORMAL LOW (ref 39.0–52.0)
Hemoglobin: 9.4 g/dL — ABNORMAL LOW (ref 13.0–17.0)
MCH: 28.1 pg (ref 26.0–34.0)
MCHC: 32.4 g/dL (ref 30.0–36.0)
MCV: 86.6 fL (ref 78.0–100.0)
PLATELETS: 432 10*3/uL — AB (ref 150–400)
RBC: 3.35 MIL/uL — AB (ref 4.22–5.81)
RDW: 17.7 % — ABNORMAL HIGH (ref 11.5–15.5)
WBC: 13.6 10*3/uL — ABNORMAL HIGH (ref 4.0–10.5)

## 2016-12-09 LAB — SURGICAL PCR SCREEN
MRSA, PCR: NEGATIVE
Staphylococcus aureus: POSITIVE — AB

## 2016-12-09 LAB — PROTIME-INR
INR: 1.28
PROTHROMBIN TIME: 16.1 s — AB (ref 11.4–15.2)

## 2016-12-09 LAB — HEPARIN LEVEL (UNFRACTIONATED)
HEPARIN UNFRACTIONATED: 0.27 [IU]/mL — AB (ref 0.30–0.70)
Heparin Unfractionated: 0.54 [IU]/mL (ref 0.30–0.70)

## 2016-12-09 MED ORDER — MUPIROCIN 2 % EX OINT
1.0000 "application " | TOPICAL_OINTMENT | Freq: Two times a day (BID) | CUTANEOUS | Status: AC
  Administered 2016-12-09 – 2016-12-14 (×10): 1 via NASAL
  Filled 2016-12-09 (×5): qty 22

## 2016-12-09 MED ORDER — WHITE PETROLATUM GEL
Status: AC
  Administered 2016-12-09: 1
  Filled 2016-12-09: qty 1

## 2016-12-09 MED ORDER — CHLORHEXIDINE GLUCONATE 0.12 % MT SOLN
OROMUCOSAL | Status: AC
  Administered 2016-12-09: 15 mL via OROMUCOSAL
  Filled 2016-12-09: qty 15

## 2016-12-09 MED ORDER — CHLORHEXIDINE GLUCONATE CLOTH 2 % EX PADS
6.0000 | MEDICATED_PAD | Freq: Every day | CUTANEOUS | Status: DC
  Administered 2016-12-11 – 2016-12-13 (×2): 6 via TOPICAL

## 2016-12-09 NOTE — Anesthesia Preprocedure Evaluation (Addendum)
Anesthesia Evaluation  Patient identified by MRN, date of birth, ID band Patient awake    Reviewed: Allergy & Precautions, H&P , NPO status , Patient's Chart, lab work & pertinent test results  Airway Mallampati: II  TM Distance: >3 FB Neck ROM: Full    Dental no notable dental hx. (+) Teeth Intact, Dental Advisory Given   Pulmonary neg pulmonary ROS,    Pulmonary exam normal breath sounds clear to auscultation       Cardiovascular Exercise Tolerance: Good  Rhythm:Regular Rate:Normal  S/p Bentall procedure   Neuro/Psych CVA, No Residual Symptoms negative psych ROS   GI/Hepatic negative GI ROS, Neg liver ROS,   Endo/Other  negative endocrine ROS  Renal/GU negative Renal ROS  negative genitourinary   Musculoskeletal   Abdominal   Peds  Hematology negative hematology ROS (+)   Anesthesia Other Findings   Reproductive/Obstetrics negative OB ROS                            Anesthesia Physical Anesthesia Plan  ASA: III  Anesthesia Plan: General   Post-op Pain Management:    Induction: Intravenous  PONV Risk Score and Plan: 3 and Ondansetron, Dexamethasone and Midazolam  Airway Management Planned: Oral ETT  Additional Equipment: CVP and Arterial line  Intra-op Plan:   Post-operative Plan: Extubation in OR  Informed Consent: I have reviewed the patients History and Physical, chart, labs and discussed the procedure including the risks, benefits and alternatives for the proposed anesthesia with the patient or authorized representative who has indicated his/her understanding and acceptance.   Dental advisory given  Plan Discussed with: CRNA  Anesthesia Plan Comments:        Anesthesia Quick Evaluation

## 2016-12-09 NOTE — Progress Notes (Signed)
Patient examined. Right knee has medial and lateral and anterior laxity but PCL appears intact.  Exam limited by patient's guarding.  Pedal pulses and ankle dorsiflexion intact on this side.  Left knee is also examined.  Anterior laxity is present.  He'll pulses ankle dorsiflexion intact on this side.  Plan is examination of both knees under anesthesia tomorrow.  Soft tissue concerns about both knees preclude intervention at this time.  I would favor therapy for range of motion exercises as well as CPM use while he is in the hospital to maintain motion in both knees.  Surgical fixation to follow up in a few weeks.  Full consult to follow following examination under anesthesia tomorrow
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
## 2016-12-09 NOTE — Progress Notes (Signed)
Rehab Admissions Coordinator Note:  Patient was screened by Trish MageLogue, Lizett Chowning M for appropriateness for an Inpatient Acute Rehab Consult.  At this time, we are recommending Inpatient Rehab consult.  Noted patient to return to the OR tomorrow.  Could wait until post operative to order inpatient rehab consult.  Lelon FrohlichLogue, Makenize Messman M 12/09/2016, 8:14 AM  I can be reached at [phone].
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
## 2016-12-09 NOTE — Progress Notes (Signed)
2 Days Post-Op  Subjective: CC less agitated overnight and Precedex down some  Objective: Vital signs in last 24 hours: Temp:  [97.3 F (36.3 C)-98.5 F (36.9 C)] 97.9 F (36.6 C) (07/23 0400) Pulse Rate:  [45-78] 59 (07/23 0730) Resp:  [10-25] 15 (07/23 0600) BP: (102-145)/(55-100) 122/72 (07/23 0700) SpO2:  [95 %-100 %] 99 % (07/23 0730) Last BM Date: 12/08/16  Intake/Output from previous day: 07/22 0701 - 07/23 0700 In: 2108.1 [I.V.:944.8; Blood:698.3; IV Piggyback:465] Out: 1875 [Urine:1875] Intake/Output this shift: No intake/output data recorded.  General appearance: alert and cooperative Resp: clear to auscultation bilaterally Chest wall: right sided chest wall tenderness, left sided chest wall tenderness Cardio: regular rate and rhythm GI: soft, NT, pelvic ex fix Male genitalia: scrotal edema improved a bit  Neuro: clam, oriented  Lab Results: CBC   Recent Labs  12/08/16 0453 12/09/16 0403  WBC 11.3* 13.6*  HGB 7.0* 9.4*  HCT 22.3* 29.0*  PLT 380 432*   BMET  Recent Labs  12/07/16 0608 12/08/16 0453  NA 144 147*  K 3.7 3.7  CL 111 114*  CO2 25 25  GLUCOSE 137* 136*  BUN 31* 32*  CREATININE 0.84 0.87  CALCIUM 8.4* 8.2*   PT/INR  Recent Labs  12/08/16 0453 12/09/16 0403  LABPROT 15.7* 16.1*  INR 1.24 1.28   ABG No results for input(s): PHART, HCO3 in the last 72 hours.  Invalid input(s): PCO2, PO2  Studies/Results: No results found.  Anti-infectives: Anti-infectives    Start     Dose/Rate Route Frequency Ordered Stop   12/07/16 0730  ceFAZolin (ANCEF) IVPB 2g/100 mL premix     2 g 200 mL/hr over 30 Minutes Intravenous On call to O.R. 12/07/16 0718 12/07/16 1134   11/28/16 2200  ceFAZolin (ANCEF) IVPB 1 g/50 mL premix     1 g 100 mL/hr over 30 Minutes Intravenous Every 8 hours 11/28/16 1707 11/30/16 1439   11/28/16 1715  ceFAZolin (ANCEF) IVPB 2g/100 mL premix  Status:  Discontinued     2 g 200 mL/hr over 30 Minutes  Intravenous Every 8 hours 11/28/16 1707 11/28/16 1726      Assessment/Plan: [facility] Resp failure - much improved R APC pelvic ring FX - per Dr. Carola FrostHandy, S/P ex fix and SI screws, NWB RLE, WBAT LLE TF only, back to OR tomorrow to plate anteriorly R knee instability - per Dr. Carola FrostHandy, MR still pending R talus FX, open L tibia FX - per Dr. Carola FrostHandy R hand [facility] dislocations - S/P pinning by Dr. Merlyn LotKuzma T2 corner FX - collar ABL anemia - up S/P 2u PRBC yesterday Delirium - improving, wean Precedex Dysphagia - FEES by ST today FEN - IVF at 50 VTE - heparin drip as has heart valve Dispo - ICU  LOS: 11 days    Violeta GelinasBurke Antoria Lanza, MD, MPH, FACS Trauma: [phone] General Surgery: [phone]  7/23/2018Patient ID: Caleb ArthursJohn J Dawson, male   DOB: 10-Jan-1951, 66 y.o.   MRN: [id number]

## 2016-12-10 ENCOUNTER — Inpatient Hospital Stay (HOSPITAL_COMMUNITY): Payer: BLUE CROSS/BLUE SHIELD

## 2016-12-10 ENCOUNTER — Encounter (HOSPITAL_COMMUNITY): Payer: Self-pay | Admitting: Anesthesiology

## 2016-12-10 ENCOUNTER — Encounter (HOSPITAL_COMMUNITY): Admission: EM | Disposition: A | Discharge status: Skilled Nursing Facility | Payer: Self-pay | Source: Home / Self Care

## 2016-12-10 ENCOUNTER — Inpatient Hospital Stay (HOSPITAL_COMMUNITY): Payer: BLUE CROSS/BLUE SHIELD | Admitting: Anesthesiology

## 2016-12-10 DIAGNOSIS — M25562 Pain in left knee: Secondary | ICD-10-CM

## 2016-12-10 DIAGNOSIS — M25561 Pain in right knee: Secondary | ICD-10-CM

## 2016-12-10 HISTORY — PX: ORIF PELVIC FRACTURE: SHX2128

## 2016-12-10 HISTORY — PX: EXTERNAL FIXATION REMOVAL: SHX5040

## 2016-12-10 SURGERY — REMOVAL, EXTERNAL FIXATION DEVICE, PELVIS
Anesthesia: General | Site: Knee

## 2016-12-10 MED ORDER — PROPOFOL 10 MG/ML IV BOLUS
INTRAVENOUS | Status: AC
  Filled 2016-12-10: qty 20

## 2016-12-10 MED ORDER — CEFAZOLIN SODIUM-DEXTROSE 2-4 GM/100ML-% IV SOLN
INTRAVENOUS | Status: AC
  Filled 2016-12-10: qty 100

## 2016-12-10 MED ORDER — FENTANYL CITRATE (PF) 250 MCG/5ML IJ SOLN
INTRAMUSCULAR | Status: AC
  Filled 2016-12-10: qty 5

## 2016-12-10 MED ORDER — FENTANYL CITRATE (PF) 100 MCG/2ML IJ SOLN
INTRAMUSCULAR | Status: DC | PRN
  Administered 2016-12-10 (×4): 50 ug via INTRAVENOUS
  Administered 2016-12-10: 100 ug via INTRAVENOUS
  Administered 2016-12-10: 50 ug via INTRAVENOUS

## 2016-12-10 MED ORDER — ACETAMINOPHEN 325 MG PO TABS
650.0000 mg | ORAL_TABLET | Freq: Four times a day (QID) | ORAL | Status: DC
  Administered 2016-12-10 – 2017-01-01 (×85): 650 mg via ORAL
  Filled 2016-12-10 (×83): qty 2

## 2016-12-10 MED ORDER — MIDAZOLAM HCL 5 MG/5ML IJ SOLN
INTRAMUSCULAR | Status: DC | PRN
  Administered 2016-12-10: 2 mg via INTRAVENOUS

## 2016-12-10 MED ORDER — LACTATED RINGERS IV SOLN
INTRAVENOUS | Status: DC | PRN
  Administered 2016-12-10: 08:00:00 via INTRAVENOUS

## 2016-12-10 MED ORDER — OXYCODONE HCL 5 MG PO TABS
5.0000 mg | ORAL_TABLET | ORAL | Status: DC | PRN
  Administered 2016-12-10 – 2016-12-22 (×22): 10 mg via ORAL
  Filled 2016-12-10 (×22): qty 2

## 2016-12-10 MED ORDER — ROCURONIUM BROMIDE 100 MG/10ML IV SOLN
INTRAVENOUS | Status: DC | PRN
  Administered 2016-12-10: 60 mg via INTRAVENOUS
  Administered 2016-12-10: 30 mg via INTRAVENOUS
  Administered 2016-12-10: 10 mg via INTRAVENOUS
  Administered 2016-12-10: 40 mg via INTRAVENOUS
  Administered 2016-12-10: 10 mg via INTRAVENOUS

## 2016-12-10 MED ORDER — MIDAZOLAM HCL 2 MG/2ML IJ SOLN
INTRAMUSCULAR | Status: AC
  Filled 2016-12-10: qty 2

## 2016-12-10 MED ORDER — HEPARIN (PORCINE) IN NACL 100-0.45 UNIT/ML-% IJ SOLN
1900.0000 [IU]/h | INTRAMUSCULAR | Status: DC
  Administered 2016-12-10 – 2016-12-11 (×3): 2400 [IU]/h via INTRAVENOUS
  Administered 2016-12-12: 2100 [IU]/h via INTRAVENOUS
  Administered 2016-12-12: 2400 [IU]/h via INTRAVENOUS
  Administered 2016-12-13 – 2016-12-16 (×7): 1900 [IU]/h via INTRAVENOUS
  Filled 2016-12-10 (×15): qty 250

## 2016-12-10 MED ORDER — ONDANSETRON HCL 4 MG/2ML IJ SOLN
INTRAMUSCULAR | Status: DC | PRN
  Administered 2016-12-10 (×2): 4 mg via INTRAVENOUS

## 2016-12-10 MED ORDER — PHENYLEPHRINE HCL 10 MG/ML IJ SOLN
INTRAMUSCULAR | Status: DC | PRN
Start: 2016-12-10 — End: 2016-12-10
  Administered 2016-12-10: 80 ug via INTRAVENOUS

## 2016-12-10 MED ORDER — CEFAZOLIN SODIUM-DEXTROSE 2-4 GM/100ML-% IV SOLN
2.0000 g | Freq: Once | INTRAVENOUS | Status: AC
  Administered 2016-12-10: 2 g via INTRAVENOUS

## 2016-12-10 MED ORDER — DEXAMETHASONE SODIUM PHOSPHATE 10 MG/ML IJ SOLN
INTRAMUSCULAR | Status: DC | PRN
  Administered 2016-12-10: 10 mg via INTRAVENOUS

## 2016-12-10 MED ORDER — PHENYLEPHRINE HCL 10 MG/ML IJ SOLN
INTRAVENOUS | Status: DC | PRN
  Administered 2016-12-10: 20 ug/min via INTRAVENOUS

## 2016-12-10 MED ORDER — HYDROMORPHONE HCL 1 MG/ML IJ SOLN: 0.2500 mg | INTRAMUSCULAR | Status: DC | PRN

## 2016-12-10 MED ORDER — LIDOCAINE HCL (CARDIAC) 20 MG/ML IV SOLN
INTRAVENOUS | Status: DC | PRN
  Administered 2016-12-10: 100 mg via INTRATRACHEAL

## 2016-12-10 MED ORDER — PROPOFOL 10 MG/ML IV BOLUS
INTRAVENOUS | Status: DC | PRN
  Administered 2016-12-10: 50 mg via INTRAVENOUS
  Administered 2016-12-10: 100 mg via INTRAVENOUS

## 2016-12-10 MED ORDER — SUGAMMADEX SODIUM 200 MG/2ML IV SOLN
INTRAVENOUS | Status: DC | PRN
  Administered 2016-12-10: 200 mg via INTRAVENOUS

## 2016-12-10 SURGICAL SUPPLY — 57 items
BIT DRILL AO MATTA 2.5MX230M (BIT) ×2 IMPLANT
BLADE CLIPPER SURG (BLADE) IMPLANT
BRUSH SCRUB SURG 4.25 DISP (MISCELLANEOUS) ×8 IMPLANT
DRAIN CHANNEL 15F RND FF W/TCR (WOUND CARE) IMPLANT
DRAPE C-ARM 42X72 X-RAY (DRAPES) ×4 IMPLANT
DRAPE C-ARMOR (DRAPES) ×4 IMPLANT
DRAPE LAPAROTOMY TRNSV 102X78 (DRAPE) ×4 IMPLANT
DRAPE SURG 17X23 STRL (DRAPES) ×4 IMPLANT
DRAPE U-SHAPE 47X51 STRL (DRAPES) ×4 IMPLANT
DRILL BIT AO MATTA 2.5MX230M (BIT) ×4
DRSG MEPILEX BORDER 4X8 (GAUZE/BANDAGES/DRESSINGS) ×4 IMPLANT
ELECT REM PT RETURN 9FT ADLT (ELECTROSURGICAL) ×4
ELECTRODE REM PT RTRN 9FT ADLT (ELECTROSURGICAL) ×2 IMPLANT
EVACUATOR SILICONE 100CC (DRAIN) IMPLANT
GAUZE SPONGE 4X4 12PLY STRL (GAUZE/BANDAGES/DRESSINGS) ×4 IMPLANT
GLOVE BIO SURGEON STRL SZ7.5 (GLOVE) ×4 IMPLANT
GLOVE BIO SURGEON STRL SZ8 (GLOVE) ×4 IMPLANT
GLOVE BIOGEL PI IND STRL 7.5 (GLOVE) ×2 IMPLANT
GLOVE BIOGEL PI IND STRL 8 (GLOVE) ×2 IMPLANT
GLOVE BIOGEL PI INDICATOR 7.5 (GLOVE) ×2
GLOVE BIOGEL PI INDICATOR 8 (GLOVE) ×2
GOWN STRL REUS W/ TWL LRG LVL3 (GOWN DISPOSABLE) ×4 IMPLANT
GOWN STRL REUS W/ TWL XL LVL3 (GOWN DISPOSABLE) ×2 IMPLANT
GOWN STRL REUS W/TWL LRG LVL3 (GOWN DISPOSABLE) ×4
GOWN STRL REUS W/TWL XL LVL3 (GOWN DISPOSABLE) ×2
KIT BASIN OR (CUSTOM PROCEDURE TRAY) ×4 IMPLANT
KIT ROOM TURNOVER OR (KITS) ×4 IMPLANT
MANIFOLD NEPTUNE II (INSTRUMENTS) ×4 IMPLANT
NDL SUT 6 .5 CRC .975X.05 MAYO (NEEDLE) ×2 IMPLANT
NEEDLE MAYO TAPER (NEEDLE) ×2
NS IRRIG 1000ML POUR BTL (IV SOLUTION) ×4 IMPLANT
PACK TOTAL JOINT (CUSTOM PROCEDURE TRAY) ×4 IMPLANT
PAD ARMBOARD 7.5X6 YLW CONV (MISCELLANEOUS) ×8 IMPLANT
PLATE SYMPHYSIS 92.5M 6H (Plate) ×4 IMPLANT
SCREW CORTEX ST MATTA 3.5X24 (Screw) ×4 IMPLANT
SCREW CORTEX ST MATTA 3.5X30MM (Screw) ×4 IMPLANT
SCREW CORTEX ST MATTA 3.5X36MM (Screw) ×4 IMPLANT
SCREW CORTEX ST MATTA 3.5X65MM (Screw) ×8 IMPLANT
SCREW CORTEX ST MATTA 3.5X70MM (Screw) ×4 IMPLANT
SPONGE LAP 18X18 X RAY DECT (DISPOSABLE) ×4 IMPLANT
STAPLER VISISTAT 35W (STAPLE) ×4 IMPLANT
SUCTION FRAZIER HANDLE 10FR (MISCELLANEOUS) ×2
SUCTION TUBE FRAZIER 10FR DISP (MISCELLANEOUS) ×2 IMPLANT
SUT ETHILON 3 0 PS 1 (SUTURE) ×8 IMPLANT
SUT FIBERWIRE #2 38 REV NDL BL (SUTURE) ×4
SUT VIC AB 0 CT1 27 (SUTURE) ×4
SUT VIC AB 0 CT1 27XBRD ANBCTR (SUTURE) ×4 IMPLANT
SUT VIC AB 1 CT1 18XCR BRD 8 (SUTURE) ×2 IMPLANT
SUT VIC AB 1 CT1 8-18 (SUTURE) ×2
SUT VIC AB 2-0 CT1 27 (SUTURE) ×2
SUT VIC AB 2-0 CT1 TAPERPNT 27 (SUTURE) ×2 IMPLANT
SUTURE FIBERWR#2 38 REV NDL BL (SUTURE) ×2 IMPLANT
TAPE PAPER 3X10 WHT MICROPORE (GAUZE/BANDAGES/DRESSINGS) ×4 IMPLANT
TOWEL OR 17X24 6PK STRL BLUE (TOWEL DISPOSABLE) IMPLANT
TOWEL OR 17X26 10 PK STRL BLUE (TOWEL DISPOSABLE) IMPLANT
TRAY FOLEY W/METER SILVER 16FR (SET/KITS/TRAYS/PACK) IMPLANT
WATER STERILE IRR 1000ML POUR (IV SOLUTION) IMPLANT

## 2016-12-10 NOTE — Anesthesia Procedure Notes (Signed)
Arterial Line Insertion Start/End7/24/2018 8:15 AM, 12/10/2016 8:20 AM Performed by: Noreene LarssonJOSLIN, Caleb Dawson, anesthesiologist  Preanesthetic checklist: patient identified, IV checked, site marked, risks and benefits discussed, surgical consent, monitors and equipment checked, pre-op evaluation and timeout performed Left, brachial was placed Hand hygiene performed  and maximum sterile barriers used   Procedure performed without using ultrasound guided technique. Following insertion, line sutured, dressing applied and Biopatch. Post procedure assessment: normal  Patient tolerated the procedure well with no immediate complications.

## 2016-12-10 NOTE — Progress Notes (Signed)
[name] with ortho paged to clarify when to restart heparin. He will call me back.
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
## 2016-12-10 NOTE — Progress Notes (Signed)
Patient receiving central line from Anesthesia at this time.  I discussed with the patient the risks and benefits of surgery, including the possibility of infection, nerve injury, vessel injury, wound breakdown, arthritis, symptomatic hardware, bladder injury, DVT/ PE, loss of motion, and need for further surgery among others.  He acknowledged these risks and wished to proceed.  Myrene GalasMichael Imunique Samad, MD [facility] [phone] (p)

## 2016-12-10 NOTE — Progress Notes (Signed)
Marisue IvanLiz with trauma paged to ask when we are to restart heparin gtt. She will contact ortho and call me back.
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
## 2016-12-10 NOTE — Progress Notes (Signed)
Orthopedic Tech Progress Note Patient Details:  Toni ArthursJohn J Loftin 02-May-1951 [id number]  CPM Left Knee CPM Left Knee: On Left Knee Flexion (Degrees): 40 Left Knee Extension (Degrees): 0 CPM Right Knee CPM Right Knee: On Right Knee Flexion (Degrees): 40 Right Knee Extension (Degrees): 0   Saul FordyceJennifer C Donnamarie Shankles 12/10/2016, 5:49 PM

## 2016-12-10 NOTE — Transfer of Care (Signed)
Immediate Anesthesia Transfer of Care Note  Patient: Toni ArthursJohn J Bixler  Procedure(s) Performed: Procedure(s): REMOVAL EXTERNAL FIXATION PELVIS (N/A) OPEN REDUCTION INTERNAL FIXATION (ORIF) PELVIC FRACTURE (N/A) EXAM UNDER ANESTHESIA BILATERAL KNEES (Bilateral)  Patient Location: PACU  Anesthesia Type:General  Level of Consciousness: awake, alert , oriented and patient cooperative  Airway & Oxygen Therapy: Patient Spontanous Breathing and Patient connected to nasal cannula oxygen  Post-op Assessment: Report given to RN and Post -op Vital signs reviewed and stable  Post vital signs: Reviewed and stable  Last Vitals:  Vitals:   12/10/16 0600 12/10/16 0700  BP: (!) 153/86 (!) 141/94  Pulse: 73 80  Resp: (!) 26 (!) 24  Temp:      Last Pain:  Vitals:   12/10/16 0400  TempSrc: Oral  PainSc: Asleep      Patients Stated Pain Goal: 2 (12/10/16 0300)  Complications: No apparent anesthesia complications

## 2016-12-10 NOTE — Progress Notes (Signed)
Right and left knees examined under anesthesia. Findings included in consult note

## 2016-12-10 NOTE — Progress Notes (Signed)
Clarification from ortho: Maintain bledsoe braces EXCEPT when on CPM, which is 3 hrs a day. Bledsoe braces to be removed for CPM and replaced afterwards.
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
## 2016-12-10 NOTE — Progress Notes (Signed)
Thigh circumference 53 CM. Thigh highs ordered, CPM and bledsoe braces ordered from [facility].

## 2016-12-10 NOTE — Progress Notes (Signed)
Please place an inpt rehab consult if pt would like to be considered for an admission. [phone]

## 2016-12-10 NOTE — Op Note (Signed)
11/28/2016 - 12/10/2016  11:30 AM  PATIENT:  Caleb Dawson  66 y.o. male  PRE-OPERATIVE DIAGNOSIS:  pelvic ring fracture, bilateral knee injuries  POST-OPERATIVE DIAGNOSIS:  pelvic ring fracture, bilateral knee injuries  PROCEDURE:  Procedure(s): 1. REMOVAL EXTERNAL FIXATION PELVIS (N/A) 2. OPEN REDUCTION INTERNAL FIXATION (ORIF) PELVIC FRACTURE 3. CURETTAGE OF EX-FIX PIN SITES SKIN, SUBCU, FASCIA 4. EXAM UNDER ANESTHESIA BILATERAL KNEES (Bilateral) (by Dr. August Saucerean)  SURGEON:  Surgeon(s) and Role:    * Myrene GalasHandy, Rosaelena Kemnitz, MD - Primary    * August Saucerean, Corrie MckusickScott Gregory, MD  ASSISTANTS: PA Student   ANESTHESIA:   general  EBL:  Total I/O In: 1400 [I.V.:1400] Out: 250 [Urine:250]  BLOOD ADMINISTERED:none  DRAINS: none   LOCAL MEDICATIONS USED:  NONE  SPECIMEN:  No Specimen  DISPOSITION OF SPECIMEN:  N/A  COUNTS:  YES  TOURNIQUET:  * No tourniquets in log *  DICTATIO: [id number]: [id number]  PLAN OF CARE: Admit to inpatient   PATIENT DISPOSITION:  PACU - hemodynamically stable.   Delay start of Pharmacological VTE agent (>24hrs) due to surgical blood loss or risk of bleeding: no

## 2016-12-10 NOTE — Anesthesia Postprocedure Evaluation (Signed)
Anesthesia Post Note  Patient: Toni ArthursJohn J Brakebill  Procedure(s) Performed: Procedure(s) (LRB): REMOVAL EXTERNAL FIXATION PELVIS (N/A) OPEN REDUCTION INTERNAL FIXATION (ORIF) PELVIC FRACTURE (N/A) EXAM UNDER ANESTHESIA BILATERAL KNEES (Bilateral)     Patient location during evaluation: PACU Anesthesia Type: General Level of consciousness: awake and alert Pain management: pain level controlled Vital Signs Assessment: post-procedure vital signs reviewed and stable Respiratory status: spontaneous breathing, nonlabored ventilation, respiratory function stable and patient connected to nasal cannula oxygen Cardiovascular status: blood pressure returned to baseline and stable Postop Assessment: no signs of nausea or vomiting Anesthetic complications: no    Last Vitals:  Vitals:   12/10/16 1215 12/10/16 1223  BP:  (!) 170/84  Pulse: 80 84  Resp: (!) 22 (!) 23  Temp:  36.4 C    Last Pain:  Vitals:   12/10/16 0400  TempSrc: Oral  PainSc: Asleep                 Mouhamadou Gittleman,W. EDMOND

## 2016-12-10 NOTE — OR Nursing (Signed)
12/09/16 @ 0840 OR Nursing.Skin breakdown at left side base of penis.  Dr. Carola FrostHandy aware.  Foley catheter tubing moved and secured to right thigh.   Karmen Stabsracie Sumner, RN

## 2016-12-10 NOTE — Progress Notes (Signed)
Dr Eduard ClosW Fitzgerald updated, OK to tx  Back to ICU.

## 2016-12-10 NOTE — Anesthesia Procedure Notes (Signed)
Central Venous Catheter Insertion Performed by: Noreene LarssonJOSLIN, Sharnell Knight Start/End7/24/2018 8:10 AM, 12/10/2016 8:15 AM Patient location: Pre-op. Preanesthetic checklist: patient identified, IV checked, site marked, risks and benefits discussed, surgical consent, monitors and equipment checked, pre-op evaluation and timeout performed Position: supine Hand hygiene performed , maximum sterile barriers used  and Seldinger technique used Catheter size: 8 Fr Total catheter length 17. Central line was placed.Double lumen Procedure performed using ultrasound guided technique. Ultrasound Notes:anatomy identified, needle tip was noted to be adjacent to the nerve/plexus identified and no ultrasound evidence of intravascular and/or intraneural injection Attempts: 1 Following insertion, line sutured, dressing applied and Biopatch. Post procedure assessment: blood return through all ports, free fluid flow and no air  Patient tolerated the procedure well with no immediate complications.

## 2016-12-10 NOTE — Progress Notes (Signed)
[facility] Progress Note  Day of Surgery  Subjective: CC:  Just returned to ICU from PACU. Awake and alert. C/o some mild lower back pain. Tolerated full liquids yesterday without nausea/vomiting/aspiration. Last BM was today before surgery and was formed, non-bloody. Foley in place with blood tinged urine.  [facility]  Objective: Vital signs in last 24 hours: Temp:  [97.6 F (36.4 C)-99.3 F (37.4 C)] 97.6 F (36.4 C) (07/24 1223) Pulse Rate:  [59-128] 84 (07/24 1223) Resp:  [15-26] 23 (07/24 1223) BP: (110-177)/(67-121) 170/84 (07/24 1223) SpO2:  [92 %-99 %] 93 % (07/24 1223) Last BM Date: 12/09/16  Intake/Output from previous day: 07/23 0701 - 07/24 0700 In: 1778 [P.O.:300; I.V.:1313; IV Piggyback:165] Out: 1495 [Urine:1495] Intake/Output this shift: Total I/O In: 1400 [I.V.:1400] Out: 350 [Urine:250; Blood:100]  PE: Gen:  Alert, NAD, pleasant  Card:  Regular rate and rhythm Pulm:  Normal effort, clear to auscultation bilaterally Abd: Soft, non-tender, bowel sounds present in all 4 quadrants GU: foley in place, scrotal swelling present, non-tender Skin: warm and dry, no rashes  MSK: R wrist splinted - fingers WWP; R knee in brace; pump boots on bilateral feet; toes WWP, active ROM toes/ankles in tact.  Psych: A&Ox3   Lab Results:   Recent Labs  12/08/16 0453 12/09/16 0403  WBC 11.3* 13.6*  HGB 7.0* 9.4*  HCT 22.3* 29.0*  PLT 380 432*   BMET  Recent Labs  12/08/16 0453  NA 147*  K 3.7  CL 114*  CO2 25  GLUCOSE 136*  BUN 32*  CREATININE 0.87  CALCIUM 8.2*   PT/INR  Recent Labs  12/08/16 0453 12/09/16 0403  LABPROT 15.7* 16.1*  INR 1.24 1.28   CMP     Component Value Date/Time   NA 147 (H) 12/08/2016 0453   K 3.7 12/08/2016 0453   CL 114 (H) 12/08/2016 0453   CO2 25 12/08/2016 0453   GLUCOSE 136 (H) 12/08/2016 0453   BUN 32 (H) 12/08/2016 0453   CREATININE 0.87 12/08/2016 0453   CALCIUM 8.2 (L)  12/08/2016 0453   PROT 4.4 (L) 11/29/2016 0349   ALBUMIN 2.6 (L) 11/29/2016 0349   AST 77 (H) 11/29/2016 0349   ALT 29 11/29/2016 0349   ALKPHOS 38 11/29/2016 0349   BILITOT 1.6 (H) 11/29/2016 0349   GFRNONAA >60 12/08/2016 0453   GFRAA >60 12/08/2016 0453   Lipase  No results found for: LIPASE     Studies/Results: Dg Pelvis 1-2 Views  Result Date: 12/10/2016 CLINICAL DATA:  Pelvic fractures. EXAM: PELVIS - 1-2 VIEW; DG C-ARM 61-120 MIN COMPARISON:  Radiographs dated 11/28/2016 FINDINGS: The patient has undergone open reduction and internal fixation of diastases of the symphysis pubis. Plate and 6 screws have been inserted across the symphysis pubis. A screw has also been placed across the sacroiliac joints. This is incompletely visualized on the available images. IMPRESSION: Open reduction and internal fixation of pelvic fractures and diastases. FLUOROSCOPY TIME:  8 seconds C-arm fluoroscopic images were obtained intraoperatively and submitted for post operative interpretation. Electronically Signed   By: Francene BoyersJames  Maxwell M.D.   On: 12/10/2016 11:04   Dg Chest Port 1 View  Result Date: 12/10/2016 CLINICAL DATA:  Central line placement. EXAM: PORTABLE CHEST 1 VIEW COMPARISON:  12/07/2016 FINDINGS: Right jugular vein catheter tip is at the cavoatrial junction in good position. Heart size is within normal limits considering the AP portable technique. Atelectasis at the left lung base. Right lung is clear.  Prosthetic valve is noted. No pneumothorax. IMPRESSION: 1. Central line in good position.  No pneumothorax. 2. New atelectasis at the left lung base.  Undo Electronically Signed   By: Francene Boyers M.D.   On: 12/10/2016 12:24   Dg C-arm 1-60 Min  Result Date: 12/10/2016 CLINICAL DATA:  Pelvic fractures. EXAM: PELVIS - 1-2 VIEW; DG C-ARM 61-120 MIN COMPARISON:  Radiographs dated 11/28/2016 FINDINGS: The patient has undergone open reduction and internal fixation of diastases of the symphysis  pubis. Plate and 6 screws have been inserted across the symphysis pubis. A screw has also been placed across the sacroiliac joints. This is incompletely visualized on the available images. IMPRESSION: Open reduction and internal fixation of pelvic fractures and diastases. FLUOROSCOPY TIME:  8 seconds C-arm fluoroscopic images were obtained intraoperatively and submitted for post operative interpretation. Electronically Signed   By: Francene Boyers M.D.   On: 12/10/2016 11:04    Anti-infectives: Anti-infectives    Start     Dose/Rate Route Frequency Ordered Stop   12/10/16 0800  ceFAZolin (ANCEF) IVPB 2g/100 mL premix     2 g 200 mL/hr over 30 Minutes Intravenous  Once 12/10/16 0756 12/10/16 0847   12/10/16 0759  ceFAZolin (ANCEF) 2-4 GM/100ML-% IVPB    Comments:  Shireen Quan   : cabinet override      12/10/16 0759 12/10/16 0847   12/07/16 0730  ceFAZolin (ANCEF) IVPB 2g/100 mL premix     2 g 200 mL/hr over 30 Minutes Intravenous On call to O.R. 12/07/16 0718 12/07/16 1134   11/28/16 2200  ceFAZolin (ANCEF) IVPB 1 g/50 mL premix     1 g 100 mL/hr over 30 Minutes Intravenous Every 8 hours 11/28/16 1707 11/30/16 1439   11/28/16 1715  ceFAZolin (ANCEF) IVPB 2g/100 mL premix  Status:  Discontinued     2 g 200 mL/hr over 30 Minutes Intravenous Every 8 hours 11/28/16 1707 11/28/16 1726     Assessment/Plan MCC Resp failure - much improved R APC pelvic ring FX - per Dr. Carola Frost, S/P ex fix and SI screws 7/12, S/P Removal ex fix, ORIF anterior pubic symphysis 7/24, NWB RLE x 8 weeks, WBAT LLE transfers only R knee instability - per Dr. Mardene Speak; exam of BL knees under general anesthesia by Dr. August Saucer 7/24 L knee - MR pending per Dr. August Saucer; formal consult note from August Saucer to follow R talus FX, open L tibia FX - per Dr. Carola Frost; s/p I&D, no fixation necessary, daily dressing changes R hand CMC dislocations - S/P pinning by Dr. Merlyn Lot 7/21 T2 corner FX - collar ABL anemia - up S/P 2u PRBC 7/22, hgb 9.4  yesterday, re-check CBC in AM Delirium - improving, wean Precedex Dysphagia - FEES 7/23, recommend full liquid diet, meds whole in puree, may use straw (1-2 sips at a time). SLP continuing to follow; appreciate their recs. FEN - full liquid diet, IVF at 50, BMET in AM VTE - heparin drip as has heart valve; will discuss when this drip can be resumed with ortho surgery. Dispo - ICU   LOS: 12 days    Adam Phenix , [facility] 12/10/2016, 1:15 PM Pager: [phone] Consults: [phone] Mon-Fri 7:00 am-4:30 pm Sat-Sun 7:00 am-11:30 am

## 2016-12-10 NOTE — Brief Op Note (Signed)
11/28/2016 - 12/10/2016  11:30 AM  PATIENT:  Caleb Dawson  66 y.o. male  PRE-OPERATIVE DIAGNOSIS:  pelvic ring fracture, bilateral knee injuries  POST-OPERATIVE DIAGNOSIS:  pelvic ring fracture, bilateral knee injuries  PROCEDURE:  Procedure(s): 1. REMOVAL EXTERNAL FIXATION PELVIS (N/A) 2. OPEN REDUCTION INTERNAL FIXATION (ORIF) PELVIC FRACTURE 3. EXAM UNDER ANESTHESIA BILATERAL KNEES (Bilateral) (by Dr. August Saucerean)  SURGEON:  Surgeon(s) and Role:    * Myrene GalasHandy, Amparo Donalson, MD - Primary    * August Saucerean, Corrie MckusickScott Gregory, MD  ASSISTANTS: PA Student   ANESTHESIA:   general  EBL:  Total I/O In: 1400 [I.V.:1400] Out: 250 [Urine:250]  BLOOD ADMINISTERED:none  DRAINS: none   LOCAL MEDICATIONS USED:  NONE  SPECIMEN:  No Specimen  DISPOSITION OF SPECIMEN:  N/A  COUNTS:  YES  TOURNIQUET:  * No tourniquets in log *  DICTATION: .Note written in EPIC  PLAN OF CARE: Admit to inpatient   PATIENT DISPOSITION:  PACU - hemodynamically stable.   Delay start of Pharmacological VTE agent (>24hrs) due to surgical blood loss or risk of bleeding: no

## 2016-12-10 NOTE — Progress Notes (Signed)
Plan examination under anesthesia of both knees today

## 2016-12-11 ENCOUNTER — Inpatient Hospital Stay (HOSPITAL_COMMUNITY): Payer: BLUE CROSS/BLUE SHIELD

## 2016-12-11 ENCOUNTER — Encounter (HOSPITAL_COMMUNITY): Payer: Self-pay | Admitting: Physical Medicine and Rehabilitation

## 2016-12-11 DIAGNOSIS — T1490XA Injury, unspecified, initial encounter: Secondary | ICD-10-CM

## 2016-12-11 DIAGNOSIS — S63054A Dislocation of other carpometacarpal joint of right hand, initial encounter: Secondary | ICD-10-CM

## 2016-12-11 DIAGNOSIS — Z952 Presence of prosthetic heart valve: Secondary | ICD-10-CM

## 2016-12-11 DIAGNOSIS — S32810D Multiple fractures of pelvis with stable disruption of pelvic ring, subsequent encounter for fracture with routine healing: Secondary | ICD-10-CM

## 2016-12-11 DIAGNOSIS — M25562 Pain in left knee: Secondary | ICD-10-CM

## 2016-12-11 DIAGNOSIS — R0682 Tachypnea, not elsewhere classified: Secondary | ICD-10-CM

## 2016-12-11 DIAGNOSIS — T148XXA Other injury of unspecified body region, initial encounter: Secondary | ICD-10-CM

## 2016-12-11 DIAGNOSIS — E870 Hyperosmolality and hypernatremia: Secondary | ICD-10-CM

## 2016-12-11 DIAGNOSIS — M25561 Pain in right knee: Secondary | ICD-10-CM

## 2016-12-11 DIAGNOSIS — S299XXA Unspecified injury of thorax, initial encounter: Secondary | ICD-10-CM

## 2016-12-11 DIAGNOSIS — M25569 Pain in unspecified knee: Secondary | ICD-10-CM

## 2016-12-11 DIAGNOSIS — S329XXD Fracture of unspecified parts of lumbosacral spine and pelvis, subsequent encounter for fracture with routine healing: Secondary | ICD-10-CM

## 2016-12-11 DIAGNOSIS — S299XXD Unspecified injury of thorax, subsequent encounter: Secondary | ICD-10-CM

## 2016-12-11 DIAGNOSIS — D62 Acute posthemorrhagic anemia: Secondary | ICD-10-CM

## 2016-12-11 DIAGNOSIS — D72829 Elevated white blood cell count, unspecified: Secondary | ICD-10-CM

## 2016-12-11 DIAGNOSIS — S63054D Dislocation of other carpometacarpal joint of right hand, subsequent encounter: Secondary | ICD-10-CM

## 2016-12-11 DIAGNOSIS — R739 Hyperglycemia, unspecified: Secondary | ICD-10-CM

## 2016-12-11 LAB — BASIC METABOLIC PANEL
Anion gap: 8 (ref 5–15)
BUN: 21 mg/dL — AB (ref 6–20)
CO2: 21 mmol/L — ABNORMAL LOW (ref 22–32)
Calcium: 7.9 mg/dL — ABNORMAL LOW (ref 8.9–10.3)
Chloride: 107 mmol/L (ref 101–111)
Creatinine, Ser: 0.78 mg/dL (ref 0.61–1.24)
GFR calc Af Amer: >60 mL/min (ref >60)
Glucose, Bld: 114 mg/dL — ABNORMAL HIGH (ref 65–99)
POTASSIUM: 4.2 mmol/L (ref 3.5–5.1)
Sodium: 136 mmol/L (ref 135–145)

## 2016-12-11 LAB — CBC
HEMATOCRIT: 28.2 % — AB (ref 39.0–52.0)
HEMOGLOBIN: 9.1 g/dL — AB (ref 13.0–17.0)
MCH: 28.6 pg (ref 26.0–34.0)
MCHC: 32.3 g/dL (ref 30.0–36.0)
MCV: 88.7 fL (ref 78.0–100.0)
Platelets: 380 10*3/uL (ref 150–400)
RBC: 3.18 MIL/uL — AB (ref 4.22–5.81)
RDW: 17.2 % — AB (ref 11.5–15.5)
WBC: 11 10*3/uL — ABNORMAL HIGH (ref 4.0–10.5)

## 2016-12-11 LAB — PROTIME-INR
INR: 1.34
Prothrombin Time: 16.7 seconds — ABNORMAL HIGH (ref 11.4–15.2)

## 2016-12-11 LAB — HEPARIN LEVEL (UNFRACTIONATED): Heparin Unfractionated: 0.54 [IU]/mL (ref 0.30–0.70)

## 2016-12-11 MED ORDER — ALPRAZOLAM 0.5 MG PO TABS
0.5000 mg | ORAL_TABLET | Freq: Three times a day (TID) | ORAL | Status: DC | PRN
  Administered 2016-12-11 – 2016-12-17 (×11): 0.5 mg via ORAL
  Filled 2016-12-11 (×11): qty 1

## 2016-12-11 MED ORDER — WARFARIN - PHARMACIST DOSING INPATIENT
Freq: Every day | Status: DC
  Administered 2016-12-14 – 2016-12-17 (×3)

## 2016-12-11 MED ORDER — PANTOPRAZOLE SODIUM 40 MG PO TBEC
40.0000 mg | DELAYED_RELEASE_TABLET | Freq: Two times a day (BID) | ORAL | Status: DC
  Administered 2016-12-11 – 2017-01-01 (×40): 40 mg via ORAL
  Filled 2016-12-11 (×41): qty 1

## 2016-12-11 MED ORDER — WARFARIN SODIUM 5 MG PO TABS
10.0000 mg | ORAL_TABLET | Freq: Once | ORAL | Status: AC
  Filled 2016-12-11: qty 1
  Filled 2016-12-11: qty 2

## 2016-12-11 NOTE — Progress Notes (Signed)
Trauma Service Note  Subjective: Patient still on Precedex.  Seems to be doing fine otherwise though.  May need further surgery on knees  Objective: Vital signs in last 24 hours: Temp:  [97.6 F (36.4 C)-98.2 F (36.8 C)] 98.2 F (36.8 C) (07/25 0800) Pulse Rate:  [49-84] 55 (07/25 0800) Resp:  [9-27] 15 (07/25 0800) BP: (92-170)/(61-95) 109/68 (07/25 0800) SpO2:  [92 %-100 %] 100 % (07/25 0800) Last BM Date: 12/10/16  Intake/Output from previous day: 07/24 0701 - 07/25 0700 In: 2958.4 [I.V.:2958.4] Out: 1750 [Urine:1650; Blood:100] Intake/Output this shift: Total I/O In: 74 [I.V.:74] Out: -   General: No acute distress.  Seems completely oriented.  Lungs: Clear to auscultation  Abd: Soft, great bowel sounds.  From the description the patient has a major disruption of his lower rectus muscles from the pubic crest that could present a problem in the future.  Tolerating his diet.  Extremities: In CPM machines bilaterally  Neuro: Intact  Lab Results: CBC   Recent Labs  12/09/16 0403 12/11/16 0015  WBC 13.6* 11.0*  HGB 9.4* 9.1*  HCT 29.0* 28.2*  PLT 432* 380   BMET  Recent Labs  12/11/16 0015  NA 136  K 4.2  CL 107  CO2 21*  GLUCOSE 114*  BUN 21*  CREATININE 0.78  CALCIUM 7.9*   PT/INR  Recent Labs  12/09/16 0403 12/11/16 0015  LABPROT 16.1* 16.7*  INR 1.28 1.34   ABG No results for input(s): PHART, HCO3 in the last 72 hours.  Invalid input(s): PCO2, PO2  Studies/Results: Dg Pelvis 1-2 Views  Result Date: 12/10/2016 CLINICAL DATA:  Pelvic fractures. EXAM: PELVIS - 1-2 VIEW; DG C-ARM 61-120 MIN COMPARISON:  Radiographs dated 11/28/2016 FINDINGS: The patient has undergone open reduction and internal fixation of diastases of the symphysis pubis. Plate and 6 screws have been inserted across the symphysis pubis. A screw has also been placed across the sacroiliac joints. This is incompletely visualized on the available images. IMPRESSION: Open  reduction and internal fixation of pelvic fractures and diastases. FLUOROSCOPY TIME:  8 seconds C-arm fluoroscopic images were obtained intraoperatively and submitted for post operative interpretation. Electronically Signed   By: Francene BoyersJames  Maxwell M.D.   On: 12/10/2016 11:04   Dg Chest Port 1 View  Result Date: 12/10/2016 CLINICAL DATA:  Central line placement. EXAM: PORTABLE CHEST 1 VIEW COMPARISON:  12/07/2016 FINDINGS: Right jugular vein catheter tip is at the cavoatrial junction in good position. Heart size is within normal limits considering the AP portable technique. Atelectasis at the left lung base. Right lung is clear. Prosthetic valve is noted. No pneumothorax. IMPRESSION: 1. Central line in good position.  No pneumothorax. 2. New atelectasis at the left lung base.  Undo Electronically Signed   By: Francene BoyersJames  Maxwell M.D.   On: 12/10/2016 12:24   Dg C-arm 1-60 Min  Result Date: 12/10/2016 CLINICAL DATA:  Pelvic fractures. EXAM: PELVIS - 1-2 VIEW; DG C-ARM 61-120 MIN COMPARISON:  Radiographs dated 11/28/2016 FINDINGS: The patient has undergone open reduction and internal fixation of diastases of the symphysis pubis. Plate and 6 screws have been inserted across the symphysis pubis. A screw has also been placed across the sacroiliac joints. This is incompletely visualized on the available images. IMPRESSION: Open reduction and internal fixation of pelvic fractures and diastases. FLUOROSCOPY TIME:  8 seconds C-arm fluoroscopic images were obtained intraoperatively and submitted for post operative interpretation. Electronically Signed   By: Francene BoyersJames  Maxwell M.D.   On: 12/10/2016 11:04  Anti-infectives: Anti-infectives    Start     Dose/Rate Route Frequency Ordered Stop   12/10/16 0800  ceFAZolin (ANCEF) IVPB 2g/100 mL premix     2 g 200 mL/hr over 30 Minutes Intravenous  Once 12/10/16 0756 12/10/16 0847   12/10/16 0759  ceFAZolin (ANCEF) 2-4 GM/100ML-% IVPB    Comments:  [name], [name]   : cabinet  override      12/10/16 0759 12/10/16 0847   12/07/16 0730  ceFAZolin (ANCEF) IVPB 2g/100 mL premix     2 g 200 mL/hr over 30 Minutes Intravenous On call to O.R. 12/07/16 0718 12/07/16 1134   11/28/16 2200  ceFAZolin (ANCEF) IVPB 1 g/50 mL premix     1 g 100 mL/hr over 30 Minutes Intravenous Every 8 hours 11/28/16 1707 11/30/16 1439   11/28/16 1715  ceFAZolin (ANCEF) IVPB 2g/100 mL premix  Status:  Discontinued     2 g 200 mL/hr over 30 Minutes Intravenous Every 8 hours 11/28/16 1707 11/28/16 1726      Assessment/Plan: s/p Procedure(s): REMOVAL EXTERNAL FIXATION PELVIS OPEN REDUCTION INTERNAL FIXATION (ORIF) PELVIC FRACTURE EXAM UNDER ANESTHESIA BILATERAL KNEES Advance diet Continue foley due to pelvic trauma and scrotal and penile edema  LOS: 13 days   Marta LamasJames O. Gae BonWyatt, III, MD, FACS [phone] Trauma Surgeon 12/11/2016
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
## 2016-12-11 NOTE — Progress Notes (Signed)
Orthopedic Tech Progress Note Patient Details:  Toni ArthursJohn J Sabet 1951/01/09 [id number]  CPM Left Knee CPM Left Knee: On Left Knee Flexion (Degrees): 40 Left Knee Extension (Degrees): 0 Additional Comments: Left knee CPM Right Knee CPM Right Knee: On Right Knee Flexion (Degrees): 40 Right Knee Extension (Degrees): 0 Additional Comments: right knee    [name], Pansie Guggisberg C 12/11/2016, 6:55 AM
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
## 2016-12-11 NOTE — Progress Notes (Addendum)
Okay from orthopedic perspective to be weightbearing as tolerated left knee with Bledsoe brace 0-90 of flexion.  Okay to weight-bear on the right leg as well with Bledsoe brace locked in full extension  Plan surgical intervention possibly 2-3 weeks depending on how the skin looks around the knee.  Okay to remove braces from the knees while the patient is in bed. Needs to continue with CPM machine both knees to maintain range of motion along with physical therapy consult for same
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
## 2016-12-11 NOTE — Progress Notes (Signed)
Orthopaedic Trauma Service (OTS)  1 Day Post-Op Procedure(s) (LRB): REMOVAL EXTERNAL FIXATION PELVIS (N/A) OPEN REDUCTION INTERNAL FIXATION (ORIF) PELVIC FRACTURE (N/A) EXAM UNDER ANESTHESIA BILATERAL KNEES (Bilateral)  Subjective: Patient reports pain as moderate.    Objective: Current Vitals Blood pressure 109/68, pulse (!) 55, temperature 97.6 F (36.4 C), temperature source Axillary, resp. rate 15, height 6\' 1"  (1.854 m), weight 81.6 kg (179 lb 14.3 oz), SpO2 100 %. Vital signs in last 24 hours: Temp:  [97.6 F (36.4 C)-98.1 F (36.7 C)] 97.6 F (36.4 C) (07/25 0400) Pulse Rate:  [49-84] 55 (07/25 0800) Resp:  [9-27] 15 (07/25 0800) BP: (92-170)/(61-95) 109/68 (07/25 0800) SpO2:  [92 %-100 %] 100 % (07/25 0800)  Intake/Output from previous day: 07/24 0701 - 07/25 0700 In: 2958.4 [I.V.:2958.4] Out: 1750 [Urine:1650; Blood:100]  LABS  Recent Labs  12/09/16 0403 12/11/16 0015  HGB 9.4* 9.1*    Recent Labs  12/09/16 0403 12/11/16 0015  WBC 13.6* 11.0*  RBC 3.35* 3.18*  HCT 29.0* 28.2*  PLT 432* 380    Recent Labs  12/11/16 0015  NA 136  K 4.2  CL 107  CO2 21*  BUN 21*  CREATININE 0.78  GLUCOSE 114*  CALCIUM 7.9*    Recent Labs  12/09/16 0403 12/11/16 0015  INR 1.28 1.34     Physical Exam RUE Splint in place Pelvis: not a spot on drsg R&LLE  CPMs in progress  Edema/ swelling controlled; TEDs  Sens: DPN, SPN, TN intact  Motor: EHL, FHL, and lessor toe ext and flex all intact grossly  Brisk cap refill, warm to touch  Assessment/Plan: 1 Day Post-Op Procedure(s) (LRB): REMOVAL EXTERNAL FIXATION PELVIS (N/A) OPEN REDUCTION INTERNAL FIXATION (ORIF) PELVIC FRACTURE (N/A) EXAM UNDER ANESTHESIA BILATERAL KNEES (Bilateral) 1. PT/OT with bed to chair transfers using left lower extremity 2. DVT proph per trauma 3. Will ask Dr. August Saucerean timing of knee recons; mri left today  Myrene GalasMichael Anagabriela Jokerst, MD [facility] [phone] (p)

## 2016-12-11 NOTE — Progress Notes (Signed)
I will follow up with pt and family tomorrow to begin discussions concerning his rehab venue options. [phone]

## 2016-12-12 ENCOUNTER — Other Ambulatory Visit (INDEPENDENT_AMBULATORY_CARE_PROVIDER_SITE_OTHER): Payer: Self-pay | Admitting: Family

## 2016-12-12 ENCOUNTER — Encounter (HOSPITAL_COMMUNITY): Payer: Self-pay | Admitting: General Practice

## 2016-12-12 LAB — PROTIME-INR
INR: 1.32
Prothrombin Time: 16.5 seconds — ABNORMAL HIGH (ref 11.4–15.2)

## 2016-12-12 LAB — CBC
HCT: 32.7 % — ABNORMAL LOW (ref 39.0–52.0)
HEMOGLOBIN: 11 g/dL — AB (ref 13.0–17.0)
MCH: 29.7 pg (ref 26.0–34.0)
MCHC: 33.6 g/dL (ref 30.0–36.0)
MCV: 88.4 fL (ref 78.0–100.0)
Platelets: 229 10*3/uL (ref 150–400)
RBC: 3.7 MIL/uL — ABNORMAL LOW (ref 4.22–5.81)
RDW: 17.2 % — AB (ref 11.5–15.5)
WBC: 9.5 10*3/uL (ref 4.0–10.5)

## 2016-12-12 LAB — HEPARIN LEVEL (UNFRACTIONATED)
HEPARIN UNFRACTIONATED: 0.91 [IU]/mL — AB (ref 0.30–0.70)
Heparin Unfractionated: 0.86 [IU]/mL — ABNORMAL HIGH (ref 0.30–0.70)
Heparin Unfractionated: 0.56 IU/mL (ref 0.30–0.70)

## 2016-12-12 MED ORDER — SODIUM CHLORIDE 0.9% FLUSH
10.0000 mL | INTRAVENOUS | Status: DC | PRN
  Administered 2016-12-12 – 2016-12-28 (×2): 10 mL
  Filled 2016-12-12 (×2): qty 40

## 2016-12-12 MED ORDER — TRAMADOL HCL 50 MG PO TABS: 50.0000 mg | ORAL_TABLET | Freq: Four times a day (QID) | ORAL | Status: DC | PRN

## 2016-12-12 MED ORDER — METHOCARBAMOL 500 MG PO TABS
1000.0000 mg | ORAL_TABLET | Freq: Three times a day (TID) | ORAL | Status: DC
  Administered 2016-12-12 – 2016-12-28 (×48): 1000 mg via ORAL
  Filled 2016-12-12 (×48): qty 2

## 2016-12-12 MED ORDER — WARFARIN SODIUM 5 MG PO TABS
10.0000 mg | ORAL_TABLET | Freq: Once | ORAL | Status: AC
  Administered 2016-12-12: 10 mg via ORAL
  Filled 2016-12-12: qty 2
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
## 2016-12-12 NOTE — Progress Notes (Signed)
Heparin drip decreased to 19/ml /hr with next heparin unfraction at 2230
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
## 2016-12-12 NOTE — Progress Notes (Signed)
Dear Doctor: Caleb Dawson This patient has been identified as a candidate for PICC for the following reason (s): IV therapy over 48 hours and poor veins/poor circulatory system (CHF, COPD, emphysema, diabetes, steroid use, IV drug abuse, etc.) If you agree, please write an order for the indicated device. For any questions contact the Vascular Access Team at [phone] if no answer, please leave a message.  Thank you for supporting the early vascular access assessment program.
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
## 2016-12-12 NOTE — Progress Notes (Signed)
I met with pt at bedside to begin discussion concerning his rehab needs and venue options. He requests that I contact his friend, [name], to assist in planning. I will call her to arrange a time for pt, [name] and myself to meet to begin these discussions. [phone] 

## 2016-12-12 NOTE — Progress Notes (Signed)
Left knee MRI scan noted. Okay to weight-bear on that left leg in full extension only with brace locked in extension. Anticipate that soft tissues will be ready on the right knee before the left. Continue with CPM for range of motion of bilateral knees. Anticipate surgery on the right knee in 2 weeks. - 3 weeks
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
## 2016-12-12 NOTE — Progress Notes (Signed)
Subjective: 2 Days Post-Op Procedure(s) (LRB): REMOVAL EXTERNAL FIXATION PELVIS (N/A) OPEN REDUCTION INTERNAL FIXATION (ORIF) PELVIC FRACTURE (N/A) EXAM UNDER ANESTHESIA BILATERAL KNEES (Bilateral) Patient reports pain as mild in right hand.  Five days post op.  No complaints.  Objective: Vital signs in last 24 hours: Temp:  [97.8 F (36.6 C)-98.5 F (36.9 C)] 97.8 F (36.6 C) (07/26 1457) Pulse Rate:  [70-90] 90 (07/26 1457) Resp:  [18-22] 18 (07/26 1457) BP: (141-157)/(74-89) 141/87 (07/26 1457) SpO2:  [97 %-99 %] 99 % (07/26 1457)  Intake/Output from previous day: 07/25 0701 - 07/26 0700 In: 286.7 [I.V.:286.7] Out: 1100 [Urine:1100] Intake/Output this shift: Total I/O In: 335 [P.O.:325; I.V.:10] Out: 1900 [Urine:1900]   Recent Labs  12/11/16 0015 12/12/16 0436  HGB 9.1* 11.0*    Recent Labs  12/11/16 0015 12/12/16 0436  WBC 11.0* 9.5  RBC 3.18* 3.70*  HCT 28.2* 32.7*  PLT 380 229    Recent Labs  12/11/16 0015  NA 136  K 4.2  CL 107  CO2 21*  BUN 21*  CREATININE 0.78  GLUCOSE 114*  CALCIUM 7.9*    Recent Labs  12/11/16 0015 12/12/16 0436  INR 1.34 1.32    Intact sensation and capillary refill all digits.  Able to move all fingers without pain.  Swelling resolved.  Pin sites without erythema or drainage.  Assessment/Plan: 2 Days Post-Op Procedure(s) (LRB): REMOVAL EXTERNAL FIXATION PELVIS (N/A) OPEN REDUCTION INTERNAL FIXATION (ORIF) PELVIC FRACTURE (N/A) EXAM UNDER ANESTHESIA BILATERAL KNEES (Bilateral) Continue splint.  May have therapy make thermoplast splint if patient will leave on.  Per family, he has been trying to take current splint off on occasion.  Will see in office 1-2 weeks.  Azreal Stthomas R 12/12/2016, 6:46 PM
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
## 2016-12-13 ENCOUNTER — Encounter (HOSPITAL_COMMUNITY): Admission: EM | Disposition: A | Discharge status: Skilled Nursing Facility | Payer: Self-pay | Source: Home / Self Care

## 2016-12-13 LAB — CBC
HEMATOCRIT: 28.2 % — AB (ref 39.0–52.0)
Hemoglobin: 9.3 g/dL — ABNORMAL LOW (ref 13.0–17.0)
MCH: 29.2 pg (ref 26.0–34.0)
MCHC: 33 g/dL (ref 30.0–36.0)
MCV: 88.4 fL (ref 78.0–100.0)
PLATELETS: 409 10*3/uL — AB (ref 150–400)
RBC: 3.19 MIL/uL — ABNORMAL LOW (ref 4.22–5.81)
RDW: 17 % — AB (ref 11.5–15.5)
WBC: 10.2 10*3/uL (ref 4.0–10.5)

## 2016-12-13 LAB — PROTIME-INR
INR: 1.24
Prothrombin Time: 15.7 seconds — ABNORMAL HIGH (ref 11.4–15.2)

## 2016-12-13 LAB — HEPARIN LEVEL (UNFRACTIONATED): HEPARIN UNFRACTIONATED: 0.47 [IU]/mL (ref 0.30–0.70)

## 2016-12-13 SURGERY — AMPUTATION, FOOT, PARTIAL
Anesthesia: General | Laterality: Right

## 2016-12-13 MED ORDER — LEVOTHYROXINE SODIUM 50 MCG PO TABS
50.0000 ug | ORAL_TABLET | Freq: Every day | ORAL | Status: DC
  Administered 2016-12-14 – 2017-01-01 (×17): 50 ug via ORAL
  Filled 2016-12-13 (×4): qty 1
  Filled 2016-12-13: qty 2
  Filled 2016-12-13 (×12): qty 1

## 2016-12-13 MED ORDER — TRAZODONE HCL 50 MG PO TABS
50.0000 mg | ORAL_TABLET | Freq: Every day | ORAL | Status: DC
  Administered 2016-12-13 – 2016-12-27 (×15): 50 mg via ORAL
  Filled 2016-12-13 (×15): qty 1

## 2016-12-13 MED ORDER — TRAMADOL HCL 50 MG PO TABS: 50.0000 mg | ORAL_TABLET | Freq: Four times a day (QID) | ORAL | Status: DC | PRN

## 2016-12-13 MED ORDER — OXYBUTYNIN CHLORIDE 5 MG PO TABS
5.0000 mg | ORAL_TABLET | Freq: Three times a day (TID) | ORAL | Status: DC | PRN
  Administered 2016-12-14 – 2016-12-16 (×5): 5 mg via ORAL
  Filled 2016-12-13 (×5): qty 1

## 2016-12-13 MED ORDER — WARFARIN SODIUM 5 MG PO TABS
10.0000 mg | ORAL_TABLET | Freq: Once | ORAL | Status: AC
  Administered 2016-12-13: 10 mg via ORAL
  Filled 2016-12-13: qty 2

## 2016-12-13 MED ORDER — HYDROMORPHONE HCL 1 MG/ML IJ SOLN
0.5000 mg | INTRAMUSCULAR | Status: DC | PRN
  Administered 2016-12-13 – 2016-12-21 (×17): 1 mg via INTRAVENOUS
  Administered 2016-12-21: 0.5 mg via INTRAVENOUS
  Administered 2016-12-22: 1 mg via INTRAVENOUS
  Filled 2016-12-13 (×19): qty 1

## 2016-12-13 MED ORDER — TRAMADOL HCL 50 MG PO TABS
100.0000 mg | ORAL_TABLET | Freq: Four times a day (QID) | ORAL | Status: DC
  Administered 2016-12-13 – 2017-01-01 (×72): 100 mg via ORAL
  Filled 2016-12-13 (×72): qty 2

## 2016-12-13 NOTE — Progress Notes (Signed)
PT Cancellation Note  Patient Details Name: Caleb Dawson MRN: [id number] DOB: Feb 29, 1951   Cancelled Treatment:    Reason Eval/Treat Not Completed: Pain limiting ability to participate (Significant other requested that we hold off on tx d/t pain)  [name], SPTA Office [phone]   [name] 12/13/2016, 3:05 PM
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
## 2016-12-13 NOTE — Progress Notes (Signed)
I met with pt, Caleb Dawson, friend and his sister at bedside. We discussed rehab venue options based on his weight bearing restrictions, [name]'s abilities physically to manage him at home as weil as his upcoming surgery in 2-3 weeks. I discussed overall plans with Dr. Naaman Plummer and we recommend possible SNF rehab until his pending knee surgery and then we can reevaluate pt for possible inpt rehab admission postoperatively pending his progress and weight bearing restrictions at that time. [facility] is in agreement to consider this as an option. I have notified SW and Denyse Amass will provide her with a list of possible SNF facilities so that she can visit over the weekend. I will follow up Monday. [phone]

## 2016-12-13 NOTE — Progress Notes (Signed)
Paged MD. Pt has of urine in bladder and feels like he has to urinate with foley in. Their is a lot of swelling in the pubic area. Will cont. To monitor.

## 2016-12-13 NOTE — Progress Notes (Signed)
Received a call from RN that the patient coughed and "heard a pop" in his abdomen and now has increased pain and fullness in his RLQ. Vitals stable. No acute distress. I went to check on the patient and discuss care with family.   Intraoperatively 7/24 patient found to have complete disruption of the deep abdominal muscle insertion onto the pelvic brim with clear visualization of obturators and retraction up to level of umbilicus. After the ORIF pubic symphysis Dr. Carola FrostHandy attempted closure of rectus but was unable to bring the muscles completely down to the pelvic rim - #1 vicryl sutures were placed.   This intraoperative finding was discussed with the family, as well as the possibility that he may develop a hernia here in the future. Case discussed with Dr. Lindie SpruceWyatt.    Exam: Gen - alert, no acute distress, complaining of 10/10 pain in lower back, knees, and abdomen Abdomen - abdomen soft, RLQ/right suprapubic region is firm with mild tenderness to palpation, no peritonitis, no distention, bowel sounds present.    Hosie SpangleElizabeth Adayah Arocho, PA-C [facility] Pager: [phone] Consults: [phone] Mon-Fri 7:00 am-4:30 pm Sat-Sun 7:00 am-11:30 am
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
## 2016-12-13 NOTE — Significant Event (Signed)
Rapid Response Event Note  Overview:Bedside RN called d/t pts agitation, combativeness, and attempting to get OOB.  RN concerned that this is a change in mental status. Time Called: 0350 Arrival Time: 0355 Event Type: Neurologic  Initial Focused Assessment: Pt sitting on side of bed, attempting to get OOB.  Pt alert to self, place, and situation, yet still trying to get OOB.  Pt says he has to pee.  Interventions: Pt reoriented and reminded that he has a catheter and cannot get OOB at this time.  4N RN (who was patient's RN last night) came up to assess pt.  4N RN says his mental status was the same as it was on 4N.  Pt got back up in the bed by himself and went to sleep.  Pt has a Soil scientisttele sitter.  Recommended a sitter at bedside. Plan of Care (if not transferred): Monitor pt.  Bedside sitter if available.  Call RRT if further assistance needed. Event Summary: Name of Physician Notified:  (bedside RN to notify MD) at      at    Outcome: Stayed in room and stabalized     MorovisHopper, Caleb AguasMindy Dawson

## 2016-12-13 NOTE — Progress Notes (Signed)
PT took out Internal Jugular central line. Cleaned up pt and called MD.
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
## 2016-12-13 NOTE — Progress Notes (Signed)
I received call from pt's significant other, [name], and we have a meeting schedule for 1 pm today to discuss his rehab venue needs and options. [phone]

## 2016-12-13 NOTE — Progress Notes (Signed)
ANTICOAGULATION CONSULT NOTE - Follow Up Consult  Pharmacy Consult for heparin Indication: AVR  Labs:  Recent Labs  12/11/16 0015 12/12/16 0436 12/12/16 1514 12/12/16 2216  HGB 9.1* 11.0*  --   --   HCT 28.2* 32.7*  --   --   PLT 380 229  --   --   LABPROT 16.7* 16.5*  --   --   INR 1.34 1.32  --   --   HEPARINUNFRC 0.54 0.91* 0.86* 0.56  CREATININE 0.78  --   --   --     Assessment/Plan:  66yo male therapeutic on heparin after rate changes. Will continue gtt at current rate and confirm stable with am labs.   Vernard GamblesVeronda Channelle Bottger, PharmD, BCPS  12/13/2016,12:07 AM
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
## 2016-12-14 LAB — HEPARIN LEVEL (UNFRACTIONATED): Heparin Unfractionated: 0.44 IU/mL (ref 0.30–0.70)

## 2016-12-14 LAB — PROTIME-INR
INR: 1.48
Prothrombin Time: 18 seconds — ABNORMAL HIGH (ref 11.4–15.2)

## 2016-12-14 MED ORDER — WARFARIN SODIUM 5 MG PO TABS
10.0000 mg | ORAL_TABLET | Freq: Once | ORAL | Status: AC
  Administered 2016-12-14: 10 mg via ORAL
  Filled 2016-12-14: qty 2

## 2016-12-14 NOTE — Progress Notes (Signed)
Skin improving both knees Continue with CPM machine and weight bearing on either extremity with Bledsoe brace locked in extension.  This also depends on the weightbearing status of the pelvis. Anticipate surgery possibly late next week or early the following week

## 2016-12-14 NOTE — Progress Notes (Signed)
[facility] Progress Note  4 Days Post-Op  Subjective: CC:  No acute issues. Working on pain control and sleep cycle. Still c/o need to urinate.   Back/leg pain stable   Objective: Vital signs in last 24 hours: Temp:  [97.7 F (36.5 C)-98.4 F (36.9 C)] 98.4 F (36.9 C) (07/28 0519) Pulse Rate:  [72-87] 83 (07/28 0519) Resp:  [20] 20 (07/28 0519) BP: (121-136)/(79-85) 136/79 (07/28 0519) SpO2:  [97 %-100 %] 97 % (07/28 0519) Last BM Date: 12/12/16  Intake/Output from previous day: 07/27 0701 - 07/28 0700 In: 2346.5 [P.O.:960; I.V.:1386.5] Out: 1700 [Urine:1700] Intake/Output this shift: No intake/output data recorded.  PE: Gen: Alert, NAD, pleasant  Card: Regular rate and rhythm Pulm: Normal effort, clear to auscultation bilaterally Abd: Soft, non-tender, bowel sounds present in all 4 quadrants GU: foley in place, scrotal swelling present, non-tender Skin: warm and dry, no rashes  MSK: R wrist splinted - fingers WWP; R knee in immobilizer; pump boots on bilateral feet; toes WWP, active ROM toes/ankles in tact.  Psych: A&Ox3   Lab Results:   Recent Labs  12/12/16 0436 12/13/16 1030  WBC 9.5 10.2  HGB 11.0* 9.3*  HCT 32.7* 28.2*  PLT 229 409*   BMET No results for input(s): NA, K, CL, CO2, GLUCOSE, BUN, CREATININE, CALCIUM in the last 72 hours. PT/INR  Recent Labs  12/13/16 0828 12/14/16 0535  LABPROT 15.7* 18.0*  INR 1.24 1.48   CMP     Component Value Date/Time   NA 136 12/11/2016 0015   K 4.2 12/11/2016 0015   CL 107 12/11/2016 0015   CO2 21 (L) 12/11/2016 0015   GLUCOSE 114 (H) 12/11/2016 0015   BUN 21 (H) 12/11/2016 0015   CREATININE 0.78 12/11/2016 0015   CALCIUM 7.9 (L) 12/11/2016 0015   PROT 4.4 (L) 11/29/2016 0349   ALBUMIN 2.6 (L) 11/29/2016 0349   AST 77 (H) 11/29/2016 0349   ALT 29 11/29/2016 0349   ALKPHOS 38 11/29/2016 0349   BILITOT 1.6 (H) 11/29/2016 0349   GFRNONAA >60 12/11/2016 0015   GFRAA >60 12/11/2016  0015   Lipase  No results found for: LIPASE     Studies/Results: No results found.  Anti-infectives: Anti-infectives    Start     Dose/Rate Route Frequency Ordered Stop   12/10/16 0800  ceFAZolin (ANCEF) IVPB 2g/100 mL premix     2 g 200 mL/hr over 30 Minutes Intravenous  Once 12/10/16 0756 12/10/16 0847   12/10/16 0759  ceFAZolin (ANCEF) 2-4 GM/100ML-% IVPB    Comments:  [name], [name]   : cabinet override      12/10/16 0759 12/10/16 0847   12/07/16 0730  ceFAZolin (ANCEF) IVPB 2g/100 mL premix     2 g 200 mL/hr over 30 Minutes Intravenous On call to O.R. 12/07/16 0718 12/07/16 1134   11/28/16 2200  ceFAZolin (ANCEF) IVPB 1 g/50 mL premix     1 g 100 mL/hr over 30 Minutes Intravenous Every 8 hours 11/28/16 1707 11/30/16 1439   11/28/16 1715  ceFAZolin (ANCEF) IVPB 2g/100 mL premix  Status:  Discontinued     2 g 200 mL/hr over 30 Minutes Intravenous Every 8 hours 11/28/16 1707 11/28/16 1726     Assessment/Plan [facility] Resp failure- much improved R APC pelvic ring FX- per Dr. Carola FrostHandy, S/P ex fix and SI screws 7/12, S/P Removal ex fix, ORIF anterior pubic symphysis 7/24,NWB RLE x 8 weeks R knee instability- per Dr. Mardene SpeakHandy/Dean; exam of BL knees  under general anesthesia by Dr. August Saucerean 7/24; possible surgery next week; NWB RLE L knee- will need eventual surgery on knees bilaterally, CPM machine now, WBAT LLE with knee brace locked in extension. R talus FX, open L tibia FX- per Dr. Carola FrostHandy; s/p I&D, no fixation necessary, daily dressing changes R hand [facility] dislocations- S/P pinning by Dr. Merlyn LotKuzma 7/21; WBAT through R elbow, NWB R hand T2 corner FX- collar ABL anemia- up S/P 2u PRBC 7/22, hgb 11.0 today Delirium- improving, trazodone started PM Dysphagia- FEES 7/23, now advanced to regular diet Penoscrotal edema/pelvic hematoma/hematuria/bladder spasms: Dr. Berneice HeinrichManny consulted, started oxybutynin, may cysto as outpt FEN- Reg diet VTE- heparin drip, bridging to coumadin per pharmacy   Dispo- floor, possible candidate for CIR but they rec SNF rehab until his pending knee surg then reassess.       LOS: 16 days    Berna Buehelsea A Leslieann Whisman , MD [facility] 12/14/2016, 9:38 AM
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
## 2016-12-14 NOTE — Plan of Care (Signed)
Problem: Pain Managment: Goal: General experience of comfort will improve Outcome: Progressing Pt continues to struggle with pain issues, but had a much clearer night tonight,and was accompanied by a sitter.
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
## 2016-12-14 NOTE — Progress Notes (Signed)
RN called pharmacy. Pharmacy stated pt can receive scheduled 1800 warfarin.

## 2016-12-15 LAB — PROTIME-INR
INR: 1.96
PROTHROMBIN TIME: 22.6 s — AB (ref 11.4–15.2)

## 2016-12-15 LAB — HEPARIN LEVEL (UNFRACTIONATED): Heparin Unfractionated: 0.55 IU/mL (ref 0.30–0.70)

## 2016-12-15 MED ORDER — WARFARIN SODIUM 7.5 MG PO TABS
7.5000 mg | ORAL_TABLET | Freq: Once | ORAL | Status: AC
  Administered 2016-12-15: 7.5 mg via ORAL
  Filled 2016-12-15: qty 1

## 2016-12-15 MED ORDER — BELLADONNA ALKALOIDS-OPIUM 16.2-60 MG RE SUPP
1.0000 | Freq: Three times a day (TID) | RECTAL | Status: DC | PRN
  Administered 2016-12-15: 1 via RECTAL
  Filled 2016-12-15: qty 1

## 2016-12-15 NOTE — Progress Notes (Signed)
[facility] Progress Note  5 Days Post-Op  Subjective: CC:  Still with bladder spasms. Working on pain control and sleep cycle.   Back/leg pain stable   Objective: Vital signs in last 24 hours: Temp:  [97.5 F (36.4 C)-99.4 F (37.4 C)] 97.5 F (36.4 C) (07/29 0536) Pulse Rate:  [85-88] 88 (07/29 0536) Resp:  [16-20] 16 (07/29 0536) BP: (116-129)/(63-76) 129/76 (07/29 0536) SpO2:  [95 %-99 %] 99 % (07/29 0536) Last BM Date: 12/12/16  Intake/Output from previous day: 07/28 0701 - 07/29 0700 In: 721 [P.O.:360; I.V.:361] Out: 1250 [Urine:1250] Intake/Output this shift: No intake/output data recorded.  PE: Gen: Alert, NAD, pleasant  Card: Regular rate and rhythm Pulm: Normal effort, clear to auscultation bilaterally Abd: Soft, non-tender, nondistended GU: foley in place, scrotal swelling present Skin: warm and dry, no rashes  MSK: R wrist splinted - fingers WWP; toes WWP, active ROM toes/ankles in tact.  Psych: A&Ox3   Lab Results:   Recent Labs  12/13/16 1030  WBC 10.2  HGB 9.3*  HCT 28.2*  PLT 409*   BMET No results for input(s): NA, K, CL, CO2, GLUCOSE, BUN, CREATININE, CALCIUM in the last 72 hours. PT/INR  Recent Labs  12/13/16 0828 12/14/16 0535  LABPROT 15.7* 18.0*  INR 1.24 1.48   CMP     Component Value Date/Time   NA 136 12/11/2016 0015   K 4.2 12/11/2016 0015   CL 107 12/11/2016 0015   CO2 21 (L) 12/11/2016 0015   GLUCOSE 114 (H) 12/11/2016 0015   BUN 21 (H) 12/11/2016 0015   CREATININE 0.78 12/11/2016 0015   CALCIUM 7.9 (L) 12/11/2016 0015   PROT 4.4 (L) 11/29/2016 0349   ALBUMIN 2.6 (L) 11/29/2016 0349   AST 77 (H) 11/29/2016 0349   ALT 29 11/29/2016 0349   ALKPHOS 38 11/29/2016 0349   BILITOT 1.6 (H) 11/29/2016 0349   GFRNONAA >60 12/11/2016 0015   GFRAA >60 12/11/2016 0015   Lipase  No results found for: LIPASE     Studies/Results: No results found.  Anti-infectives: Anti-infectives    Start      Dose/Rate Route Frequency Ordered Stop   12/10/16 0800  ceFAZolin (ANCEF) IVPB 2g/100 mL premix     2 g 200 mL/hr over 30 Minutes Intravenous  Once 12/10/16 0756 12/10/16 0847   12/10/16 0759  ceFAZolin (ANCEF) 2-4 GM/100ML-% IVPB    Comments:  [name], [name]   : cabinet override      12/10/16 0759 12/10/16 0847   12/07/16 0730  ceFAZolin (ANCEF) IVPB 2g/100 mL premix     2 g 200 mL/hr over 30 Minutes Intravenous On call to O.R. 12/07/16 0718 12/07/16 1134   11/28/16 2200  ceFAZolin (ANCEF) IVPB 1 g/50 mL premix     1 g 100 mL/hr over 30 Minutes Intravenous Every 8 hours 11/28/16 1707 11/30/16 1439   11/28/16 1715  ceFAZolin (ANCEF) IVPB 2g/100 mL premix  Status:  Discontinued     2 g 200 mL/hr over 30 Minutes Intravenous Every 8 hours 11/28/16 1707 11/28/16 1726     Assessment/Plan M[facility] Resp failure- resolved R APC pelvic ring FX- per Dr. Carola FrostHandy, S/P ex fix and SI screws 7/12, S/P Removal ex fix, ORIF anterior pubic symphysis 7/24,NWB RLE x 8 weeks R knee instability- per Dr. Mardene SpeakHandy/Dean; exam of BL knees under general anesthesia by Dr. August Saucerean 7/24; possible surgery next week; NWB RLE L knee- will need eventual surgery on knees bilaterally, WBAT LLE with knee brace  locked in extension. R talus FX, open L tibia FX- per Dr. Carola FrostHandy; s/p I&D, no fixation necessary, daily dressing changes R hand CMC dislocations- S/P pinning by Dr. Merlyn LotKuzma 7/21; WBAT through R elbow, NWB R hand T2 corner FX- collar ABL anemia- up S/P 2u PRBC 7/22, hgb 11.0 today Delirium- improving, trazodone started PM Dysphagia- FEES 7/23, now advanced to regular diet Penoscrotal edema/pelvic hematoma/hematuria/bladder spasms: Dr. Berneice HeinrichManny consulted, started oxybutynin, may cysto as outpt FEN- Reg diet VTE- heparin drip, bridging to coumadin per pharmacy  Dispo- floor, possible candidate for CIR but they rec SNF rehab until his pending knee surg then reassess. Will add B&O suppository for bladder spasms       LOS:  17 days    Berna Buehelsea A Kahlee Metivier , MD [facility] 12/15/2016, 9:44 AM
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
## 2016-12-16 ENCOUNTER — Other Ambulatory Visit (INDEPENDENT_AMBULATORY_CARE_PROVIDER_SITE_OTHER): Payer: Self-pay | Admitting: Orthopedic Surgery

## 2016-12-16 DIAGNOSIS — M2392 Unspecified internal derangement of left knee: Secondary | ICD-10-CM

## 2016-12-16 LAB — PROTIME-INR
INR: 1.99
PROTHROMBIN TIME: 22.9 s — AB (ref 11.4–15.2)

## 2016-12-16 LAB — CBC
HEMATOCRIT: 20.9 % — AB (ref 39.0–52.0)
Hemoglobin: 6.7 g/dL — CL (ref 13.0–17.0)
MCH: 28.3 pg (ref 26.0–34.0)
MCHC: 32.1 g/dL (ref 30.0–36.0)
MCV: 88.2 fL (ref 78.0–100.0)
Platelets: 416 10*3/uL — ABNORMAL HIGH (ref 150–400)
RBC: 2.37 MIL/uL — ABNORMAL LOW (ref 4.22–5.81)
RDW: 16.7 % — AB (ref 11.5–15.5)
WBC: 10.4 10*3/uL (ref 4.0–10.5)

## 2016-12-16 LAB — HEPARIN LEVEL (UNFRACTIONATED): HEPARIN UNFRACTIONATED: 0.44 [IU]/mL (ref 0.30–0.70)

## 2016-12-16 LAB — PREPARE RBC (CROSSMATCH)

## 2016-12-16 MED ORDER — FERROUS GLUCONATE 324 (38 FE) MG PO TABS
324.0000 mg | ORAL_TABLET | Freq: Two times a day (BID) | ORAL | Status: DC
  Administered 2016-12-16 – 2017-01-01 (×28): 324 mg via ORAL
  Filled 2016-12-16 (×33): qty 1

## 2016-12-16 MED ORDER — ADULT MULTIVITAMIN W/MINERALS CH
1.0000 | ORAL_TABLET | Freq: Every day | ORAL | Status: DC
  Administered 2016-12-16 – 2017-01-01 (×15): 1 via ORAL
  Filled 2016-12-16 (×15): qty 1

## 2016-12-16 MED ORDER — WARFARIN SODIUM 5 MG PO TABS
10.0000 mg | ORAL_TABLET | Freq: Once | ORAL | Status: AC
  Administered 2016-12-16: 10 mg via ORAL
  Filled 2016-12-16: qty 2

## 2016-12-16 MED ORDER — SODIUM CHLORIDE 0.9 % IV SOLN
Freq: Once | INTRAVENOUS | Status: AC
  Administered 2016-12-16: 14:00:00 via INTRAVENOUS

## 2016-12-16 MED ORDER — PREMIER PROTEIN SHAKE
11.0000 [oz_av] | Freq: Two times a day (BID) | ORAL | Status: DC
  Administered 2016-12-16 – 2016-12-26 (×10): 11 [oz_av] via ORAL
  Filled 2016-12-16 (×38): qty 325.31

## 2016-12-16 MED ORDER — PRO-STAT SUGAR FREE PO LIQD
30.0000 mL | Freq: Three times a day (TID) | ORAL | Status: DC
  Administered 2016-12-16 – 2017-01-01 (×22): 30 mL via ORAL
  Filled 2016-12-16 (×27): qty 30

## 2016-12-16 NOTE — Progress Notes (Signed)
[facility] Progress Note  6 Days Post-Op  Subjective: CC:  Patient reports that he feels tired. Abdominal pain improved. Patient feels bladder spasms improving. Majority of pain is in lower back. Tolerating PO without nausea/vomiting. Denies acute events overnight.  Still taking regular IV dilaudid and only receiving oxycodone about once daily.  Critical value this AM - hgb 6.7; INR 1.96; BP 110/61  Objective: Vital signs in last 24 hours: Temp:  [97.8 F (36.6 C)-99.3 F (37.4 C)] 98.7 F (37.1 C) (07/30 0500) Pulse Rate:  [84-91] 91 (07/30 0500) Resp:  [14-18] 18 (07/30 0500) BP: (104-138)/(61-72) 110/61 (07/30 0500) SpO2:  [91 %-97 %] 96 % (07/30 0500) Last BM Date: 12/12/16  Intake/Output from previous day: 07/29 0701 - 07/30 0700 In: 538 [P.O.:120; I.V.:418] Out: 1450 [Urine:1450] Intake/Output this shift: No intake/output data recorded.  PE: Gen: Alert, NAD, pleasant  Card: Regular rate and rhythm Pulm: Normal effort, clear to auscultation bilaterally Abd: Soft, non-tender, nondistended GU: foley in place, scrotal swelling present Skin: warm and dry, no rashes  MSK: R wrist splinted - fingers WWP; toes WWP, active ROM toes/ankles in tact; bilateral lower extremities are not in any brace/immobilizer, per ortho notes BRACES MAY BE REMOVED WHILE PT IN BED BUT SHOULD BE IN PLACE FOR AMBULATION. Psych: A&Ox3   Lab Results:   Recent Labs  12/13/16 1030  WBC 10.2  HGB 9.3*  HCT 28.2*  PLT 409*   BMET No results for input(s): NA, K, CL, CO2, GLUCOSE, BUN, CREATININE, CALCIUM in the last 72 hours. PT/INR  Recent Labs  12/14/16 0535 12/15/16 0922  LABPROT 18.0* 22.6*  INR 1.48 1.96   CMP     Component Value Date/Time   NA 136 12/11/2016 0015   K 4.2 12/11/2016 0015   CL 107 12/11/2016 0015   CO2 21 (L) 12/11/2016 0015   GLUCOSE 114 (H) 12/11/2016 0015   BUN 21 (H) 12/11/2016 0015   CREATININE 0.78 12/11/2016 0015   CALCIUM 7.9 (L)  12/11/2016 0015   PROT 4.4 (L) 11/29/2016 0349   ALBUMIN 2.6 (L) 11/29/2016 0349   AST 77 (H) 11/29/2016 0349   ALT 29 11/29/2016 0349   ALKPHOS 38 11/29/2016 0349   BILITOT 1.6 (H) 11/29/2016 0349   GFRNONAA >60 12/11/2016 0015   GFRAA >60 12/11/2016 0015   Lipase  No results found for: LIPASE     Studies/Results: No results found.  Anti-infectives: Anti-infectives    Start     Dose/Rate Route Frequency Ordered Stop   12/10/16 0800  ceFAZolin (ANCEF) IVPB 2g/100 mL premix     2 g 200 mL/hr over 30 Minutes Intravenous  Once 12/10/16 0756 12/10/16 0847   12/10/16 0759  ceFAZolin (ANCEF) 2-4 GM/100ML-% IVPB    Comments:  [name], [name]   : cabinet override      12/10/16 0759 12/10/16 0847   12/07/16 0730  ceFAZolin (ANCEF) IVPB 2g/100 mL premix     2 g 200 mL/hr over 30 Minutes Intravenous On call to O.R. 12/07/16 0718 12/07/16 1134   11/28/16 2200  ceFAZolin (ANCEF) IVPB 1 g/50 mL premix     1 g 100 mL/hr over 30 Minutes Intravenous Every 8 hours 11/28/16 1707 11/30/16 1439   11/28/16 1715  ceFAZolin (ANCEF) IVPB 2g/100 mL premix  Status:  Discontinued     2 g 200 mL/hr over 30 Minutes Intravenous Every 8 hours 11/28/16 1707 11/28/16 1726     Assessment/Plan [facility] Resp failure- resolved R APC pelvic  ring FX- per Dr. Carola FrostHandy, S/P ex fix and SI screws 7/12, S/P Removal ex fix, ORIF anterior pubic symphysis 7/24,NWB RLE x 8 weeks R knee instability- per Dr. Mardene SpeakHandy/Dean; exam of BL knees under general anesthesia by Dr. August Saucerean 7/24; possible surgery next week; NWB RLE L knee- will need eventual surgery on knees bilaterally, WBAT LLE with knee brace locked in extension. R talus FX, open L tibia FX- per Dr. Carola FrostHandy; s/p I&D, no fixation necessary, daily dressing changes R hand CMC dislocations- S/P pinning by Dr. Merlyn LotKuzma 7/21; WBAT through R elbow, NWB R hand T2 corner FX- collar ABL anemia- up S/P 2u PRBC 7/22, hgb dropped to 6.7 today - transfuse 2 units PRBC's Delirium-  improving, trazodone started PM Dysphagia- FEES 7/23, now advanced to regular diet Penoscrotal edema/pelvic hematoma/hematuria/bladder spasms: Dr. Berneice HeinrichManny consulted, started oxybutynin, may cysto as outpt; B&O suppository added for bladder spasms 7/29 FEN- Reg diet VTE- heparin drip, bridging to coumadin per pharmacy   Dispo- floor, bridging to coumadin, PO pain control discharge to SNF when medically stable with potential rehab admission after pending knee surgeries.    LOS: 18 days    Adam PhenixElizabeth S Dominico Rod , [facility] 12/16/2016, 8:42 AM Pager: [phone] Consults: [phone] Mon-Fri 7:00 am-4:30 pm Sat-Sun 7:00 am-11:30 am
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
## 2016-12-16 NOTE — Progress Notes (Signed)
Noted surgical plans as outlined by Dr. August Saucerean. I will follow pt's case at a distance. [phone]
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
## 2016-12-16 NOTE — Progress Notes (Signed)
Patient doing recently well.  Receiving 2 units of blood transfusion today.  Left knee skin is actually looking improved.  I think it's conceivable that we can do his left knee MCL anterior cruciate ligament PCL surgery either Friday or Tuesday.  Optimally it may be better for Tuesday.  In regard to the right knee there still a draining wound below the knee which would need to heal up prior to any surgical intervention .  This right leg also has significant amount of swelling.  Tentative plan at this time is to examine Caleb Dawson again on Wednesday and decide optimal date for left knee reconstruction.  This will be based primarily on how the skin looks around that left knee.  As well as how his general overall condition is.

## 2016-12-16 NOTE — Progress Notes (Signed)
Pt heparin drip stopped as ordered. P. Amo Other Atienza RN. 
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
## 2016-12-16 NOTE — Clinical Social Work Note (Signed)
Per Pt's significant other pt [place] [employer]. CSW spoke with pt's significant other via telephone to determine SNF choice. Pt will be examined Wednesday to determine surgical date. Pt's significant [facility] if [facility] take [employer]. Pt's significant other states until pt's surgical plan is determined she would like to hold off on deciding SNF. CSW to stay in touch with pt's significant other.  MercerBridget Ellouise Mcwhirter, [place]CSWA [phone]

## 2016-12-16 NOTE — Progress Notes (Signed)
CRITICAL VALUE ALERT  Critical Value: Hgb 6.7  Date & Time Notied:  12/16/16; 0900  Provider Notified: Jasmine DecemberE. Simaan PA at bedside  Orders Received/Actions taken: New orders received.

## 2016-12-17 ENCOUNTER — Encounter (HOSPITAL_COMMUNITY): Payer: Self-pay | Admitting: Orthopedic Surgery

## 2016-12-17 ENCOUNTER — Inpatient Hospital Stay (HOSPITAL_COMMUNITY): Payer: BLUE CROSS/BLUE SHIELD

## 2016-12-17 LAB — TYPE AND SCREEN
ABO/RH(D): A POS
ANTIBODY SCREEN: NEGATIVE
UNIT DIVISION: 0
Unit division: 0

## 2016-12-17 LAB — CBC
HCT: 24.8 % — ABNORMAL LOW (ref 39.0–52.0)
HEMOGLOBIN: 8.3 g/dL — AB (ref 13.0–17.0)
MCH: 29.1 pg (ref 26.0–34.0)
MCHC: 33.5 g/dL (ref 30.0–36.0)
MCV: 87 fL (ref 78.0–100.0)
PLATELETS: 383 10*3/uL (ref 150–400)
RBC: 2.85 MIL/uL — ABNORMAL LOW (ref 4.22–5.81)
RDW: 17.1 % — ABNORMAL HIGH (ref 11.5–15.5)
WBC: 7.2 10*3/uL (ref 4.0–10.5)

## 2016-12-17 LAB — BPAM RBC
Blood Product Expiration Date: 201808172359
Blood Product Expiration Date: 201808172359
ISSUE DATE / TIME: 201807301328
ISSUE DATE / TIME: 201807301629
UNIT TYPE AND RH: 6200
Unit Type and Rh: 6200

## 2016-12-17 LAB — PROTIME-INR
INR: 2.27
PROTHROMBIN TIME: 25.5 s — AB (ref 11.4–15.2)

## 2016-12-17 LAB — C-REACTIVE PROTEIN: CRP: 23.6 mg/dL — AB (ref <1.0)

## 2016-12-17 MED ORDER — WARFARIN SODIUM 5 MG PO TABS
10.0000 mg | ORAL_TABLET | Freq: Once | ORAL | Status: AC
  Administered 2016-12-17: 10 mg via ORAL
  Filled 2016-12-17: qty 2
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
## 2016-12-17 NOTE — Progress Notes (Signed)
RN placed order for tele sitter per PA verbal order.
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
## 2016-12-17 NOTE — Progress Notes (Signed)
Lab will be coming this morning to get patient's CBC.  Spoke with Morrie SheldonAshley at [phone].
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
## 2016-12-18 LAB — PROTIME-INR
INR: 2.33
Prothrombin Time: 26 seconds — ABNORMAL HIGH (ref 11.4–15.2)

## 2016-12-18 LAB — CBC WITH DIFFERENTIAL/PLATELET
Basophils Absolute: 0 10*3/uL (ref 0.0–0.1)
Basophils Relative: 1 %
Eosinophils Absolute: 0.3 10*3/uL (ref 0.0–0.7)
Eosinophils Relative: 4 %
HEMATOCRIT: 28.9 % — AB (ref 39.0–52.0)
HEMOGLOBIN: 9.3 g/dL — AB (ref 13.0–17.0)
LYMPHS ABS: 0.9 10*3/uL (ref 0.7–4.0)
LYMPHS PCT: 11 %
MCH: 28.3 pg (ref 26.0–34.0)
MCHC: 32.2 g/dL (ref 30.0–36.0)
MCV: 87.8 fL (ref 78.0–100.0)
MONOS PCT: 11 %
Monocytes Absolute: 0.8 10*3/uL (ref 0.1–1.0)
NEUTROS ABS: 5.7 10*3/uL (ref 1.7–7.7)
NEUTROS PCT: 73 %
Platelets: 423 10*3/uL — ABNORMAL HIGH (ref 150–400)
RBC: 3.29 MIL/uL — AB (ref 4.22–5.81)
RDW: 16.7 % — ABNORMAL HIGH (ref 11.5–15.5)
WBC: 7.8 10*3/uL (ref 4.0–10.5)

## 2016-12-18 LAB — SEDIMENTATION RATE: SED RATE: 84 mm/h — AB (ref 0–16)

## 2016-12-18 MED ORDER — WARFARIN SODIUM 5 MG PO TABS: 10.0000 mg | ORAL_TABLET | Freq: Once | ORAL | Status: DC
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
## 2016-12-18 NOTE — Progress Notes (Signed)
Patient's iv was leaking.  Staff was unable to re-start iv.  Iv team consulted.  Ultrasound was used; iv was unable to re-start by iv team.  On call contacted.  On call requested that patient be ready 30 mins earlier in am for surgery so iv can be placed in the OR.

## 2016-12-18 NOTE — Progress Notes (Signed)
Tentatively plan left knee surgery next Tuesday
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
## 2016-12-18 NOTE — Plan of Care (Signed)
Problem: Pain Managment: Goal: General experience of comfort will improve Outcome: Progressing Pt. Resting well with pain regimen.

## 2016-12-18 NOTE — Anesthesia Preprocedure Evaluation (Addendum)
Anesthesia Evaluation  Patient identified by MRN, date of birth, ID band Patient awake    Reviewed: Allergy & Precautions, H&P , NPO status , Patient's Chart, lab work & pertinent test results  Airway Mallampati: II  TM Distance: >3 FB Neck ROM: Full    Dental no notable dental hx. (+) Teeth Intact, Dental Advisory Given   Pulmonary neg pulmonary ROS,    Pulmonary exam normal breath sounds clear to auscultation       Cardiovascular negative cardio ROS   Rhythm:Regular Rate:Normal  S/p Bentall procedure   Neuro/Psych CVA, No Residual Symptoms negative psych ROS   GI/Hepatic negative GI ROS, Neg liver ROS,   Endo/Other  Hypothyroidism   Renal/GU negative Renal ROS  negative genitourinary   Musculoskeletal   Abdominal   Peds  Hematology  (+) anemia ,   Anesthesia Other Findings   Reproductive/Obstetrics negative OB ROS                            Lab Results  Component Value Date   WBC 8.3 12/19/2016   HGB 9.6 (L) 12/19/2016   HCT 29.9 (L) 12/19/2016   MCV 89.3 12/19/2016   PLT 425 (H) 12/19/2016   Lab Results  Component Value Date   CREATININE 0.78 12/11/2016   BUN 21 (H) 12/11/2016   NA 136 12/11/2016   K 4.2 12/11/2016   CL 107 12/11/2016   CO2 21 (L) 12/11/2016    Anesthesia Physical  Anesthesia Plan  ASA: III  Anesthesia Plan: General   Post-op Pain Management:    Induction: Intravenous  PONV Risk Score and Plan: 2 and Ondansetron and Dexamethasone  Airway Management Planned: Oral ETT  Additional Equipment:   Intra-op Plan:   Post-operative Plan: Extubation in OR  Informed Consent: I have reviewed the patients History and Physical, chart, labs and discussed the procedure including the risks, benefits and alternatives for the proposed anesthesia with the patient or authorized representative who has indicated his/her understanding and acceptance.   Dental  advisory given  Plan Discussed with: CRNA  Anesthesia Plan Comments:        Anesthesia Quick Evaluation

## 2016-12-18 NOTE — Progress Notes (Signed)
Called PA, was advised to hold coumadin today prior to surgery tomorrow.

## 2016-12-18 NOTE — Progress Notes (Signed)
Orthopedic Tech Progress Note Patient Details:  Caleb ArthursJohn Dawson Dawson 10/20/1950 [id number]  Patient ID: Caleb ArthursJohn Dawson Caleb Dawson, male   DOB: 10/20/1950, 66 y.o.   MRN: [id number] Pt refused cpm. Will call when ready for cpm.  Trinna PostMartinez, Caleb Dawson 12/18/2016, 6:33 PM

## 2016-12-19 ENCOUNTER — Encounter (HOSPITAL_COMMUNITY): Payer: Self-pay | Admitting: Certified Registered"

## 2016-12-19 ENCOUNTER — Inpatient Hospital Stay (HOSPITAL_COMMUNITY): Payer: BLUE CROSS/BLUE SHIELD | Admitting: Anesthesiology

## 2016-12-19 ENCOUNTER — Encounter (HOSPITAL_COMMUNITY): Admission: EM | Disposition: A | Discharge status: Skilled Nursing Facility | Payer: Self-pay | Source: Home / Self Care

## 2016-12-19 HISTORY — PX: I&D EXTREMITY: SHX5045

## 2016-12-19 LAB — CBC
HEMATOCRIT: 29.9 % — AB (ref 39.0–52.0)
Hemoglobin: 9.6 g/dL — ABNORMAL LOW (ref 13.0–17.0)
MCH: 28.7 pg (ref 26.0–34.0)
MCHC: 32.1 g/dL (ref 30.0–36.0)
MCV: 89.3 fL (ref 78.0–100.0)
PLATELETS: 425 10*3/uL — AB (ref 150–400)
RBC: 3.35 MIL/uL — AB (ref 4.22–5.81)
RDW: 16.8 % — AB (ref 11.5–15.5)
WBC: 8.3 10*3/uL (ref 4.0–10.5)

## 2016-12-19 LAB — PROTIME-INR
INR: 2.38
Prothrombin Time: 26.4 seconds — ABNORMAL HIGH (ref 11.4–15.2)

## 2016-12-19 SURGERY — IRRIGATION AND DEBRIDEMENT EXTREMITY
Anesthesia: General

## 2016-12-19 MED ORDER — SODIUM CHLORIDE 0.9 % IJ SOLN
INTRAMUSCULAR | Status: AC
  Filled 2016-12-19: qty 10

## 2016-12-19 MED ORDER — SODIUM CHLORIDE 0.9 % IR SOLN
Status: DC | PRN
  Administered 2016-12-19 (×3): 3000 mL

## 2016-12-19 MED ORDER — LACTATED RINGERS IV SOLN
INTRAVENOUS | Status: DC
  Administered 2016-12-19 – 2016-12-21 (×4): via INTRAVENOUS

## 2016-12-19 MED ORDER — WARFARIN SODIUM 5 MG PO TABS
10.0000 mg | ORAL_TABLET | Freq: Once | ORAL | Status: AC
  Administered 2016-12-19: 10 mg via ORAL
  Filled 2016-12-19: qty 2

## 2016-12-19 MED ORDER — ROCURONIUM BROMIDE 10 MG/ML (PF) SYRINGE
PREFILLED_SYRINGE | INTRAVENOUS | Status: DC | PRN
  Administered 2016-12-19: 40 mg via INTRAVENOUS

## 2016-12-19 MED ORDER — SODIUM CHLORIDE 0.9 % IV SOLN
1500.0000 mg | Freq: Once | INTRAVENOUS | Status: AC
  Administered 2016-12-19: 1500 mg via INTRAVENOUS
  Filled 2016-12-19: qty 1500

## 2016-12-19 MED ORDER — VANCOMYCIN HCL IN DEXTROSE 1-5 GM/200ML-% IV SOLN
1000.0000 mg | Freq: Three times a day (TID) | INTRAVENOUS | Status: DC
  Administered 2016-12-20 – 2016-12-23 (×10): 1000 mg via INTRAVENOUS
  Filled 2016-12-19 (×12): qty 200

## 2016-12-19 MED ORDER — PROPOFOL 10 MG/ML IV BOLUS
INTRAVENOUS | Status: DC | PRN
  Administered 2016-12-19: 150 mg via INTRAVENOUS

## 2016-12-19 MED ORDER — MIDAZOLAM HCL 5 MG/5ML IJ SOLN
INTRAMUSCULAR | Status: DC | PRN
  Administered 2016-12-19: 2 mg via INTRAVENOUS

## 2016-12-19 MED ORDER — MIDAZOLAM HCL 2 MG/2ML IJ SOLN
INTRAMUSCULAR | Status: AC
  Filled 2016-12-19: qty 2

## 2016-12-19 MED ORDER — LIDOCAINE 2% (20 MG/ML) 5 ML SYRINGE
INTRAMUSCULAR | Status: DC | PRN
  Administered 2016-12-19: 60 mg via INTRAVENOUS

## 2016-12-19 MED ORDER — LIDOCAINE 2% (20 MG/ML) 5 ML SYRINGE
INTRAMUSCULAR | Status: AC
  Filled 2016-12-19: qty 5

## 2016-12-19 MED ORDER — PHENYLEPHRINE 40 MCG/ML (10ML) SYRINGE FOR IV PUSH (FOR BLOOD PRESSURE SUPPORT)
PREFILLED_SYRINGE | INTRAVENOUS | Status: DC | PRN
  Administered 2016-12-19 (×2): 80 ug via INTRAVENOUS

## 2016-12-19 MED ORDER — ONDANSETRON HCL 4 MG/2ML IJ SOLN
INTRAMUSCULAR | Status: AC
  Filled 2016-12-19: qty 2

## 2016-12-19 MED ORDER — SUFENTANIL CITRATE 50 MCG/ML IV SOLN
INTRAVENOUS | Status: AC
  Filled 2016-12-19: qty 1

## 2016-12-19 MED ORDER — FENTANYL CITRATE (PF) 100 MCG/2ML IJ SOLN: 25.0000 ug | INTRAMUSCULAR | Status: DC | PRN

## 2016-12-19 MED ORDER — PIPERACILLIN-TAZOBACTAM 3.375 G IVPB
3.3750 g | Freq: Three times a day (TID) | INTRAVENOUS | Status: DC
  Administered 2016-12-19 – 2016-12-23 (×11): 3.375 g via INTRAVENOUS
  Filled 2016-12-19 (×14): qty 50

## 2016-12-19 MED ORDER — ONDANSETRON HCL 4 MG/2ML IJ SOLN
INTRAMUSCULAR | Status: DC | PRN
  Administered 2016-12-19: 4 mg via INTRAVENOUS

## 2016-12-19 MED ORDER — PHENYLEPHRINE 40 MCG/ML (10ML) SYRINGE FOR IV PUSH (FOR BLOOD PRESSURE SUPPORT)
PREFILLED_SYRINGE | INTRAVENOUS | Status: AC
  Filled 2016-12-19: qty 10

## 2016-12-19 MED ORDER — PROPOFOL 10 MG/ML IV BOLUS
INTRAVENOUS | Status: AC
  Filled 2016-12-19: qty 20

## 2016-12-19 MED ORDER — SUFENTANIL CITRATE 50 MCG/ML IV SOLN
INTRAVENOUS | Status: DC | PRN
  Administered 2016-12-19: 20 ug via INTRAVENOUS
  Administered 2016-12-19 (×3): 10 ug via INTRAVENOUS

## 2016-12-19 MED ORDER — OXYBUTYNIN CHLORIDE 5 MG PO TABS
5.0000 mg | ORAL_TABLET | Freq: Two times a day (BID) | ORAL | Status: DC
  Administered 2016-12-19 – 2016-12-22 (×7): 5 mg via ORAL
  Filled 2016-12-19 (×7): qty 1

## 2016-12-19 MED ORDER — PROMETHAZINE HCL 25 MG/ML IJ SOLN: 6.2500 mg | INTRAMUSCULAR | Status: DC | PRN

## 2016-12-19 MED ORDER — PIPERACILLIN-TAZOBACTAM 3.375 G IVPB 30 MIN
3.3750 g | INTRAVENOUS | Status: AC
  Administered 2016-12-19: 3.375 g via INTRAVENOUS
  Filled 2016-12-19: qty 50

## 2016-12-19 MED ORDER — DEXAMETHASONE SODIUM PHOSPHATE 10 MG/ML IJ SOLN
INTRAMUSCULAR | Status: AC
  Filled 2016-12-19: qty 1

## 2016-12-19 MED ORDER — ROCURONIUM BROMIDE 10 MG/ML (PF) SYRINGE
PREFILLED_SYRINGE | INTRAVENOUS | Status: AC
  Filled 2016-12-19: qty 5

## 2016-12-19 MED ORDER — DEXAMETHASONE SODIUM PHOSPHATE 10 MG/ML IJ SOLN
INTRAMUSCULAR | Status: DC | PRN
  Administered 2016-12-19: 5 mg via INTRAVENOUS

## 2016-12-19 SURGICAL SUPPLY — 48 items
BNDG COHESIVE 4X5 TAN STRL (GAUZE/BANDAGES/DRESSINGS) IMPLANT
BNDG GAUZE ELAST 4 BULKY (GAUZE/BANDAGES/DRESSINGS) IMPLANT
BNDG GAUZE STRTCH 6 (GAUZE/BANDAGES/DRESSINGS) IMPLANT
BRUSH SCRUB SURG 4.25 DISP (MISCELLANEOUS) ×6 IMPLANT
CANISTER WOUND CARE 500ML ATS (WOUND CARE) ×3 IMPLANT
COVER SURGICAL LIGHT HANDLE (MISCELLANEOUS) ×3 IMPLANT
DRAPE U-SHAPE 47X51 STRL (DRAPES) ×3 IMPLANT
DRSG ADAPTIC 3X8 NADH LF (GAUZE/BANDAGES/DRESSINGS) ×3 IMPLANT
DRSG VAC ATS MED SENSATRAC (GAUZE/BANDAGES/DRESSINGS) ×3 IMPLANT
ELECT REM PT RETURN 9FT ADLT (ELECTROSURGICAL)
ELECTRODE REM PT RTRN 9FT ADLT (ELECTROSURGICAL) IMPLANT
GAUZE SPONGE 4X4 12PLY STRL (GAUZE/BANDAGES/DRESSINGS) ×3 IMPLANT
GLOVE BIO SURGEON STRL SZ7.5 (GLOVE) ×3 IMPLANT
GLOVE BIO SURGEON STRL SZ8 (GLOVE) ×3 IMPLANT
GLOVE BIOGEL PI IND STRL 6.5 (GLOVE) ×1 IMPLANT
GLOVE BIOGEL PI IND STRL 7.5 (GLOVE) ×3 IMPLANT
GLOVE BIOGEL PI IND STRL 8 (GLOVE) ×1 IMPLANT
GLOVE BIOGEL PI INDICATOR 6.5 (GLOVE) ×2
GLOVE BIOGEL PI INDICATOR 7.5 (GLOVE) ×6
GLOVE BIOGEL PI INDICATOR 8 (GLOVE) ×2
GLOVE SURG SS PI 6.5 STRL IVOR (GLOVE) ×3 IMPLANT
GLOVE SURG SS PI 8.0 STRL IVOR (GLOVE) ×3 IMPLANT
GOWN STRL REUS W/ TWL LRG LVL3 (GOWN DISPOSABLE) ×3 IMPLANT
GOWN STRL REUS W/ TWL XL LVL3 (GOWN DISPOSABLE) ×2 IMPLANT
GOWN STRL REUS W/TWL LRG LVL3 (GOWN DISPOSABLE) ×6
GOWN STRL REUS W/TWL XL LVL3 (GOWN DISPOSABLE) ×4
HANDPIECE INTERPULSE COAX TIP (DISPOSABLE)
KIT BASIN OR (CUSTOM PROCEDURE TRAY) ×3 IMPLANT
KIT ROOM TURNOVER OR (KITS) ×3 IMPLANT
MANIFOLD NEPTUNE II (INSTRUMENTS) ×3 IMPLANT
NS IRRIG 1000ML POUR BTL (IV SOLUTION) ×3 IMPLANT
PACK ORTHO EXTREMITY (CUSTOM PROCEDURE TRAY) ×3 IMPLANT
PAD ARMBOARD 7.5X6 YLW CONV (MISCELLANEOUS) ×6 IMPLANT
PADDING CAST COTTON 6X4 STRL (CAST SUPPLIES) IMPLANT
SET HNDPC FAN SPRY TIP SCT (DISPOSABLE) IMPLANT
SPONGE LAP 18X18 X RAY DECT (DISPOSABLE) ×3 IMPLANT
STOCKINETTE IMPERVIOUS 9X36 MD (GAUZE/BANDAGES/DRESSINGS) IMPLANT
SUT PDS AB 1 CT  36 (SUTURE) ×2
SUT PDS AB 1 CT 36 (SUTURE) ×1 IMPLANT
SUT PDS AB 2-0 CT1 27 (SUTURE) IMPLANT
SWAB CULTURE ESWAB REG 1ML (MISCELLANEOUS) IMPLANT
TOWEL OR 17X24 6PK STRL BLUE (TOWEL DISPOSABLE) ×3 IMPLANT
TOWEL OR 17X26 10 PK STRL BLUE (TOWEL DISPOSABLE) ×9 IMPLANT
TUBE CONNECTING 12'X1/4 (SUCTIONS) ×1
TUBE CONNECTING 12X1/4 (SUCTIONS) ×2 IMPLANT
UNDERPAD 30X30 (UNDERPADS AND DIAPERS) ×3 IMPLANT
WATER STERILE IRR 1000ML POUR (IV SOLUTION) ×3 IMPLANT
YANKAUER SUCT BULB TIP NO VENT (SUCTIONS) ×3 IMPLANT
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
## 2016-12-19 NOTE — Transfer of Care (Signed)
Immediate Anesthesia Transfer of Care Note  Patient: Caleb ArthursJohn J Sampsel  Procedure(s) Performed: Procedure(s): IRRIGATION AND DEBRIDEMENT PELVIS (N/A)  Patient Location: PACU  Anesthesia Type:General  Level of Consciousness: awake and patient cooperative  Airway & Oxygen Therapy: Patient Spontanous Breathing and Patient connected to nasal cannula oxygen  Post-op Assessment: Report given to RN, Post -op Vital signs reviewed and stable and Patient moving all extremities  Post vital signs: Reviewed and stable  Last Vitals:  Vitals:   12/19/16 0539 12/19/16 0950  BP: (!) 156/87 (!) 164/95  Pulse: 80 89  Resp: 18 16  Temp: 36.8 C     Last Pain:  Vitals:   12/19/16 0539  TempSrc: Oral  PainSc:       Patients Stated Pain Goal: 3 (12/19/16 0530)  Complications: No apparent anesthesia complications

## 2016-12-19 NOTE — Clinical Social Work Note (Signed)
Clinical Social Worker continuing to follow patient and family for support and discharge planning needs.  Patient in OR this morning for irrigation and debridement of pelvis.  Patient mental status remains inappropriate to complete SBIRT at this time.  Patient is scheduled for surgery on Tuesday 08/07 and following therapies will re-evaluate for CIR vs. SNF.  Patient significant other has available bed offers if needed.  CSW remains available for support and to facilitate patient discharge needs.  Macario GoldsJesse Takako Minckler, [place] [phone]

## 2016-12-19 NOTE — Progress Notes (Signed)
I discussed with the patient the risks and benefits of surgery, including the possibility of infection, nerve injury, vessel injury, wound breakdown, arthritis, DVT/ PE, loss of motion, and need for further surgery among others.  He acknowledged these risks and wished to proceed.  Myrene GalasMichael Ashland Wiseman, MD [facility] [phone] (p)

## 2016-12-19 NOTE — Progress Notes (Addendum)
ANTICOAGULATION CONSULT NOTE - Follow Up Consult  Pharmacy Consult:  Coumadin Indication:  Mechanical aortic valve  Allergies  Allergen Reactions  . No Known Allergies     Patient Measurements: Height: 6\' 1"  (185.4 cm) Weight: 179 lb 14.3 oz (81.6 kg) IBW/kg (Calculated) : 79.9  Vital Signs: Temp: 97.6 F (36.4 C) (08/02 1212) Temp Source: Oral (08/02 1212) BP: 128/76 (08/02 1212) Pulse Rate: 84 (08/02 1212)  Labs:  Recent Labs  12/17/16 0749 12/18/16 0720 12/19/16 0442  HGB 8.3* 9.3* 9.6*  HCT 24.8* 28.9* 29.9*  PLT 383 423* 425*  LABPROT 25.5* 26.0* 26.4*  INR 2.27 2.33 2.38    Estimated Creatinine Clearance: 105.4 mL/min (by C-G formula based on SCr of 0.78 mg/dL).    Assessment: [age] YOM with history of mechanical aortic valve to continue on Coumadin from PTA.  Per patient's partner, he checks his INR and calls the Coumadin clinic for Coumadin dosing.    Today's INR is therapeutic although Coumadin was not charted as given yesterday.  He just returned from the OR for an I&D.  No overt bleeding.   Goal of Therapy:  INR 2.5- 3.5    Plan:  Coumadin 10mg  PO today Daily INR Monitor for s/sx of bleeding    Tonatiuh Mallon D. Laney Potashang, PharmD, BCPS Pager:  [phone] 12/19/2016, 12:27 PM    ==========================   Addendum: - start Zosyn and vancomycin for deep wound infection - SCr 0.78 on 12/11/16, CrCL ~105 ml/min   Plan: Vanc 1500mg  IV x 1, then 1gm IV Q8H Zosyn 3.375gm IV Q8H, 4 hr infusion Monitor renal fxn, micro data to de-escalate abx  BMET in AM    Gahel Safley D. Laney Potashang, PharmD, BCPS Pager:  [phone] 12/19/2016, 1:09 PM

## 2016-12-19 NOTE — Progress Notes (Signed)
PT Cancellation Note  Patient Details Name: Toni ArthursJohn J Gutterman MRN: [id number] DOB: 07/16/1950   Cancelled Treatment:    Reason Eval/Treat Not Completed: Patient at procedure or test/unavailable. Will check back tomorrow.  Gardiner RamusMichael Yurianna Tusing, VirginiaPTA Office #[phone]   Gardiner RamusMichael Keaundre Thelin 12/19/2016, 8:51 AM

## 2016-12-19 NOTE — Anesthesia Postprocedure Evaluation (Signed)
Anesthesia Post Note  Patient: Caleb ArthursJohn J Dawson  Procedure(s) Performed: Procedure(s) (LRB): IRRIGATION AND DEBRIDEMENT PELVIS (N/A)     Patient location during evaluation: PACU Anesthesia Type: General Level of consciousness: awake and alert Pain management: pain level controlled Vital Signs Assessment: post-procedure vital signs reviewed and stable Respiratory status: spontaneous breathing, nonlabored ventilation, respiratory function stable and patient connected to nasal cannula oxygen Cardiovascular status: blood pressure returned to baseline and stable Postop Assessment: no signs of nausea or vomiting Anesthetic complications: no    Last Vitals:  Vitals:   12/19/16 1050 12/19/16 1120  BP: (!) 143/87 (!) 148/91  Pulse: 76 79  Resp: 12 15  Temp:      Last Pain:  Vitals:   12/19/16 1050  TempSrc:   PainSc: Ardean LarsenAsleep                 Gawain Crombie E

## 2016-12-19 NOTE — Anesthesia Procedure Notes (Signed)
Procedure Name: Intubation Date/Time: 12/19/2016 8:23 AM Performed by: Melina Copa, Annalie Wenner R Pre-anesthesia Checklist: Patient identified, Emergency Drugs available, Suction available and Patient being monitored Patient Re-evaluated:Patient Re-evaluated prior to induction Oxygen Delivery Method: Circle System Utilized Preoxygenation: Pre-oxygenation with 100% oxygen Induction Type: IV induction Ventilation: Mask ventilation without difficulty Laryngoscope Size: Mac and 4 Grade View: Grade I Tube type: Oral Number of attempts: 1 Airway Equipment and Method: Stylet Placement Confirmation: ETT inserted through vocal cords under direct vision,  positive ETCO2 and breath sounds checked- equal and bilateral Secured at: 24 cm Tube secured with: Tape Dental Injury: Teeth and Oropharynx as per pre-operative assessment
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
## 2016-12-19 NOTE — Progress Notes (Signed)
Patient to OR at 0640. 

## 2016-12-19 NOTE — Brief Op Note (Signed)
11/28/2016 - 12/19/2016  9:43 AM  PATIENT:  Caleb Dawson  66 y.o. male  PRE-OPERATIVE DIAGNOSIS:  POSSIBLE INFECTION, PELVIC INCISION  POST-OPERATIVE DIAGNOSIS:  Hematoma   PROCEDURE:  Procedure(s): 1. IRRIGATION AND DEBRIDEMENT PELVIS (N/A)  2. APPLICATION MEDIUM WOUND VAC  SURGEON:  Surgeon(s) and Role:    * Myrene GalasHandy, Ardell Makarewicz, MD - Primary  PHYSICIAN ASSISTANT: Montez MoritaKEITH PAUL, PA-C  ANESTHESIA:   general  EBL:  Total I/O In: 700 [I.V.:700] Out: 800 [Blood:800]  BLOOD ADMINISTERED:none  DRAINS: WOUND VAC   LOCAL MEDICATIONS USED:  NONE  SPECIMEN:  TWO  DISPOSITION OF SPECIMEN:  MICRO  COUNTS:  YES  TOURNIQUET:  * No tourniquets in log *  DICTATION: .Other Dictation: Dictation Number [id number]  PLAN OF CARE: Admit to inpatient   PATIENT DISPOSITION:  PACU - hemodynamically stable.   Delay start of Pharmacological VTE agent (>24hrs) due to surgical blood loss or risk of bleeding: no
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
## 2016-12-20 ENCOUNTER — Encounter (HOSPITAL_COMMUNITY): Payer: Self-pay | Admitting: Orthopedic Surgery

## 2016-12-20 LAB — TYPE AND SCREEN
ABO/RH(D): A POS
ANTIBODY SCREEN: NEGATIVE

## 2016-12-20 LAB — BASIC METABOLIC PANEL
Anion gap: 6 (ref 5–15)
BUN: 14 mg/dL (ref 6–20)
CALCIUM: 8.5 mg/dL — AB (ref 8.9–10.3)
CHLORIDE: 98 mmol/L — AB (ref 101–111)
CO2: 28 mmol/L (ref 22–32)
CREATININE: 0.93 mg/dL (ref 0.61–1.24)
GFR calc non Af Amer: >60 mL/min (ref >60)
GLUCOSE: 114 mg/dL — AB (ref 65–99)
Potassium: 4.4 mmol/L (ref 3.5–5.1)
Sodium: 132 mmol/L — ABNORMAL LOW (ref 135–145)

## 2016-12-20 LAB — PROTIME-INR
INR: 2.49
Prothrombin Time: 27.4 seconds — ABNORMAL HIGH (ref 11.4–15.2)

## 2016-12-20 LAB — CBC
HEMATOCRIT: 28.9 % — AB (ref 39.0–52.0)
HEMOGLOBIN: 9.2 g/dL — AB (ref 13.0–17.0)
MCH: 28.1 pg (ref 26.0–34.0)
MCHC: 31.8 g/dL (ref 30.0–36.0)
MCV: 88.4 fL (ref 78.0–100.0)
Platelets: 399 10*3/uL (ref 150–400)
RBC: 3.27 MIL/uL — ABNORMAL LOW (ref 4.22–5.81)
RDW: 16.3 % — AB (ref 11.5–15.5)
WBC: 9 10*3/uL (ref 4.0–10.5)

## 2016-12-20 LAB — HEPARIN LEVEL (UNFRACTIONATED)
HEPARIN UNFRACTIONATED: 0.66 [IU]/mL (ref 0.30–0.70)
Heparin Unfractionated: 0.3 [IU]/mL (ref 0.30–0.70)

## 2016-12-20 LAB — VANCOMYCIN, RANDOM: VANCOMYCIN RM: 16

## 2016-12-20 MED ORDER — POTASSIUM CHLORIDE IN NACL 20-0.9 MEQ/L-% IV SOLN
INTRAVENOUS | Status: DC
  Administered 2016-12-21: via INTRAVENOUS
  Filled 2016-12-20 (×2): qty 1000

## 2016-12-20 MED ORDER — HEPARIN (PORCINE) IN NACL 100-0.45 UNIT/ML-% IJ SOLN
1450.0000 [IU]/h | INTRAMUSCULAR | Status: DC
  Administered 2016-12-20 – 2016-12-21 (×2): 1900 [IU]/h via INTRAVENOUS
  Administered 2016-12-22: 1700 [IU]/h via INTRAVENOUS
  Administered 2016-12-24: 1450 [IU]/h via INTRAVENOUS
  Filled 2016-12-20 (×8): qty 250

## 2016-12-20 NOTE — Progress Notes (Signed)
Trauma Service Note  Subjective: Patient sitting up in bed, looking very good.No distress.  Will need to come off coumadin before surgery on his knees Tuesday.  Objective: Vital signs in last 24 hours: Temp:  [97.6 F (36.4 C)-98.4 F (36.9 C)] 97.8 F (36.6 C) (08/03 0422) Pulse Rate:  [65-89] 65 (08/03 0422) Resp:  [12-20] 20 (08/03 0422) BP: (108-164)/(61-95) 109/63 (08/03 0422) SpO2:  [96 %-100 %] 100 % (08/03 0422) Weight:  [81.6 kg (179 lb 14.3 oz)] 81.6 kg (179 lb 14.3 oz) (08/02 1212) Last BM Date: 12/12/17  Intake/Output from previous day: 08/02 0701 - 08/03 0700 In: 3107.5 [P.O.:250; I.V.:1707.5; IV Piggyback:800] Out: 5675 [Urine:4500; Drains:375; Blood:800] Intake/Output this shift: No intake/output data recorded.  General: No distress  Lungs: Clear  Abd: Much softer and less tender  Extremities: No changes.  Needs future surgery on knees  Neuro: Intact  Lab Results: CBC   Recent Labs  12/19/16 0442 12/20/16 0331  WBC 8.3 9.0  HGB 9.6* 9.2*  HCT 29.9* 28.9*  PLT 425* 399   BMET  Recent Labs  12/20/16 0331  NA 132*  K 4.4  CL 98*  CO2 28  GLUCOSE 114*  BUN 14  CREATININE 0.93  CALCIUM 8.5*   PT/INR  Recent Labs  12/19/16 0442 12/20/16 0331  LABPROT 26.4* 27.4*  INR 2.38 2.49   ABG No results for input(s): PHART, HCO3 in the last 72 hours.  Invalid input(s): PCO2, PO2  Studies/Results: No results found.  Anti-infectives: Anti-infectives    Start     Dose/Rate Route Frequency Ordered Stop   12/20/16 0000  vancomycin (VANCOCIN) IVPB 1000 mg/200 mL premix     1,000 mg 200 mL/hr over 60 Minutes Intravenous Every 8 hours 12/19/16 1353     12/19/16 1400  piperacillin-tazobactam (ZOSYN) IVPB 3.375 g     3.375 g 12.5 mL/hr over 240 Minutes Intravenous Every 8 hours 12/19/16 1310     12/19/16 1400  vancomycin (VANCOCIN) 1,500 mg in sodium chloride 0.9 % 500 mL IVPB     1,500 mg 250 mL/hr over 120 Minutes Intravenous  Once  12/19/16 1353 12/19/16 1700   12/19/16 0845  piperacillin-tazobactam (ZOSYN) IVPB 3.375 g     3.375 g 100 mL/hr over 30 Minutes Intravenous To Surgery 12/19/16 0833 12/19/16 0900   12/10/16 0800  ceFAZolin (ANCEF) IVPB 2g/100 mL premix     2 g 200 mL/hr over 30 Minutes Intravenous  Once 12/10/16 0756 12/10/16 0847   12/10/16 0759  ceFAZolin (ANCEF) 2-4 GM/100ML-% IVPB    Comments:  [name], [name]   : cabinet override      12/10/16 0759 12/10/16 0847   12/07/16 0730  ceFAZolin (ANCEF) IVPB 2g/100 mL premix     2 g 200 mL/hr over 30 Minutes Intravenous On call to O.R. 12/07/16 0718 12/07/16 1134   11/28/16 2200  ceFAZolin (ANCEF) IVPB 1 g/50 mL premix     1 g 100 mL/hr over 30 Minutes Intravenous Every 8 hours 11/28/16 1707 11/30/16 1439   11/28/16 1715  ceFAZolin (ANCEF) IVPB 2g/100 mL premix  Status:  Discontinued     2 g 200 mL/hr over 30 Minutes Intravenous Every 8 hours 11/28/16 1707 11/28/16 1726      Assessment/Plan: s/p Procedure(s): IRRIGATION AND DEBRIDEMENT PELVIS Advance diet Continue foley due to Significant penile and scrotal swelling  Will need to hold coumadin prior to surgery next week, bridge with heparin  LOS: 22 days   Marta LamasJames O. Lindie SpruceWyatt, III,  MD, FACS [phone] Trauma Surgeon 12/20/2016
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
## 2016-12-20 NOTE — Progress Notes (Signed)
Tuesday ortho knee surgery more tentative now with abdominal infection
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
## 2016-12-20 NOTE — Progress Notes (Signed)
We are following at a distance as we await surgery plans for Tuesday. His weight bearing status will determine if pt will need SNF rehab or inpt rehab given limitations of his caregiver support. [phone]
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
## 2016-12-20 NOTE — Progress Notes (Addendum)
ANTICOAGULATION CONSULT NOTE - Follow Up Consult  Pharmacy Consult for Heparin (warfarin on hold) Indication: Mechancial AVR  Allergies  Allergen Reactions  . No Known Allergies     Patient Measurements: Height: 6\' 1"  (185.4 cm) Weight: 179 lb 14.3 oz (81.6 kg) IBW/kg (Calculated) : 79.9  Vital Signs: Temp: 98.3 F (36.8 C) (08/03 2011) Temp Source: Oral (08/03 2011) BP: 119/67 (08/03 2011) Pulse Rate: 68 (08/03 2011)  Labs:  Recent Labs  12/18/16 0720 12/19/16 0442 12/20/16 0331 12/20/16 1636 12/20/16 2239  HGB 9.3* 9.6* 9.2*  --   --   HCT 28.9* 29.9* 28.9*  --   --   PLT 423* 425* 399  --   --   LABPROT 26.0* 26.4* 27.4*  --   --   INR 2.33 2.38 2.49  --   --   HEPARINUNFRC  --   --   --  0.30 0.66  CREATININE  --   --  0.93  --   --     Estimated Creatinine Clearance: 90.7 mL/min (by C-G formula based on SCr of 0.93 mg/dL).    Assessment: Heparin while warfarin on hold for surgery. INR goal is 2.5-3.5. INR this AM was 2.49. Heparin was started at previously therapeutic rate of 1900 units/hr. Heparin level now therapeutic x 2.   Goal of Therapy:  Heparin level 0.3-0.7 units/ml Monitor platelets by anticoagulation protocol: Yes   Plan:  -Cont heparin 1900 units/hr -Daily CBC/HL -Check INR 8/4 AM to make sure it is still <2.5  Abran DukeLedford, Munirah Doerner 12/20/2016,11:49 PM   ========================= Heparin level elevated this AM, no issues per RN -Dec heparin to 1700 units/hr -1300 HL Abran DukeJames Brandi Tomlinson, PharmD, BCPS Clinical Pharmacist Phone: [phone] ==========================

## 2016-12-20 NOTE — Progress Notes (Signed)
ANTIBIOTIC CONSULT NOTE - INITIAL  Pharmacy Consult for  Zosyn and vancomycin for deep wound infection Indication:  for deep wound infection    Allergies  Allergen Reactions  . No Known Allergies     Patient Measurements: Height: _0  (185.4 cm) Weight: 179 lb 14.3 oz (81.6 kg) IBW/kg (Calculated) : 79.9   Vital Signs:   Intake/Output from previous day: 08/02 0701 - 08/03 0700 In: 3107.5 [P.O.:250; I.V.:1707.5; IV Piggyback:800] Out: 4163 [Urine:4500; Drains:375; Blood:800] Intake/Output from this shift: Total I/O In: 450 [P.O.:450] Out: 800 [Urine:800]  Labs:  Recent Labs  12/18/16 0720 12/19/16 0442 12/20/16 0331  WBC 7.8 8.3 9.0  HGB 9.3* 9.6* 9.2*  PLT 423* 425* 399  CREATININE  --   --  0.93   Estimated Creatinine Clearance: 90.7 mL/min (by C-G formula based on SCr of 0.93 mg/dL).  Recent Labs  12/20/16 1701  VANCORANDOM 16     Microbiology: Recent Results (from the past 720 hour(s))  MRSA PCR Screening     Status: None   Collection Time: 11/28/16  5:01 PM  Result Value Ref Range Status   MRSA by PCR NEGATIVE NEGATIVE Final    Comment:        The GeneXpert MRSA Assay (FDA approved for NASAL specimens only), is one component of a comprehensive MRSA colonization surveillance program. It is not intended to diagnose MRSA infection nor to guide or monitor treatment for MRSA infections.   Culture, respiratory (NON-Expectorated)     Status: None   Collection Time: 12/06/16  8:45 AM  Result Value Ref Range Status   Specimen Description TRACHEAL ASPIRATE  Final   Special Requests Normal  Final   Gram Stain   Final    FEW WBC PRESENT, PREDOMINANTLY PMN RARE GRAM POSITIVE COCCI RARE GRAM POSITIVE RODS RARE GRAM NEGATIVE RODS    Culture Consistent with normal respiratory flora.  Final   Report Status 12/08/2016 FINAL  Final  Surgical pcr screen     Status: Abnormal   Collection Time: 12/09/16 12:01 PM  Result Value Ref Range Status   MRSA,  PCR NEGATIVE NEGATIVE Final   Staphylococcus aureus POSITIVE (A) NEGATIVE Final    Comment:        The Xpert SA Assay (FDA approved for NASAL specimens in patients over 49 years of age), is one component of a comprehensive surveillance program.  Test performance has been validated by [facility] for patients greater than or equal to 88 year old. It is not intended to diagnose infection nor to guide or monitor treatment.   Aerobic/Anaerobic Culture (surgical/deep wound)     Status: None (Preliminary result)   Collection Time: 12/19/16  8:56 AM  Result Value Ref Range Status   Specimen Description WOUND ABDOMEN  Final   Special Requests POST PELVIC PINNING  Final   Gram Stain   Final    RARE WBC PRESENT,BOTH PMN AND MONONUCLEAR RARE GRAM POSITIVE RODS    Culture CULTURE REINCUBATED FOR BETTER GROWTH  Final   Report Status PENDING  Incomplete    Medical History: Past Medical History:  Diagnosis Date  . Hx of heart valve replacement with mechanical valve   . Hypothyroid     Medications:  Scheduled:  . acetaminophen  650 mg Oral Q6H  . chlorhexidine gluconate (MEDLINE KIT)  15 mL Mouth Rinse BID  . feeding supplement (PRO-STAT SUGAR FREE 64)  30 mL Oral TID BM  . ferrous gluconate  324 mg Oral BID WC  .  levothyroxine  50 mcg Oral QAC breakfast  . methocarbamol  1,000 mg Oral TID  . multivitamin with minerals  1 tablet Oral Daily  . oxybutynin  5 mg Oral BID  . pantoprazole  40 mg Oral BID  . protein supplement shake  11 oz Oral BID BM  . traMADol  100 mg Oral Q6H  . traZODone  50 mg Oral QHS   Assessment: RN called me reporting that the AM vancomycin dose charted given _0 :23 may have just finished infusing at 15:45 when RN about to hang the next scheduled 16:00 dose.  I discussed with the previous shift RN and current RN's.  Due to uncertainty of when last vancomcyin dose given I decided to check vancomycin random level- which was 63mg/ml.  Level is appropriate to give  next scheduled vancomycin dose. Not that this is not a steady state level.   Patient is afebrile. WBC  Wnl.   Goal of Therapy:  15-20 mcg/ml  Microbiology:  7/20 TA - normal flora 7/12 MRSA - NEG 7/23 MRSA surgical PCR:  Staph aureus positive;  MRSA PCR negative 8/2 abd wound - GPR on Gram stain  Plan:  Okay to give next scheduled Vancomycin 1g dose now.  Continue vancomycin 1g q8 hour. Check vancomycin trough at steady state. Continue zosyn 3.375 g IV q8h (extended 4hr infusion) -okay to give dose now.  RNicole Cella RPh Clinical Pharmacist Pager: [phone]/07/2016,6:16 PM

## 2016-12-20 NOTE — Progress Notes (Signed)
Subjective: 2 weeks s/p CRPP right hand cmc dislocations. Patient reports pain as minimal in hand.    Objective: Vital signs in last 24 hours: Temp:  [97.8 F (36.6 C)-98.4 F (36.9 C)] 97.8 F (36.6 C) (08/03 0422) Pulse Rate:  [65-81] 65 (08/03 0422) Resp:  [18-20] 20 (08/03 0422) BP: (108-142)/(61-87) 109/63 (08/03 0422) SpO2:  [97 %-100 %] 100 % (08/03 0422)  Intake/Output from previous day: 08/02 0701 - 08/03 0700 In: 3107.5 [P.O.:250; I.V.:1707.5; IV Piggyback:800] Out: 5675 [Urine:4500; Drains:375; Blood:800] Intake/Output this shift: Total I/O In: 450 [P.O.:450] Out: 800 [Urine:800]   Recent Labs  12/18/16 0720 12/19/16 0442 12/20/16 0331  HGB 9.3* 9.6* 9.2*    Recent Labs  12/19/16 0442 12/20/16 0331  WBC 8.3 9.0  RBC 3.35* 3.27*  HCT 29.9* 28.9*  PLT 425* 399    Recent Labs  12/20/16 0331  NA 132*  K 4.4  CL 98*  CO2 28  BUN 14  CREATININE 0.93  GLUCOSE 114*  CALCIUM 8.5*    Recent Labs  12/19/16 0442 12/20/16 0331  INR 2.38 2.49    Intact sensation and capillary refill all digits.  Moving all digits.  Swelling resolved.  Pin sites look good with no erythema or drainage.  Assessment/Plan: 1 Day Post-Op Procedure(s) (LRB): IRRIGATION AND DEBRIDEMENT PELVIS (N/A) Will ask therapy to make removable splint.  Start daily pin care once splint made.  NWB through hand.  WB through forearm okay.  Tilly Pernice R 12/20/2016, 3:04 PM

## 2016-12-20 NOTE — Progress Notes (Signed)
OT Note  Received order for hand splint. Will plan to fabricate tomorrow. [facility] Lyndall Windt, OT/L  [phone] 12/20/2016
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
## 2016-12-21 ENCOUNTER — Inpatient Hospital Stay (HOSPITAL_COMMUNITY): Payer: BLUE CROSS/BLUE SHIELD | Admitting: Anesthesiology

## 2016-12-21 ENCOUNTER — Encounter (HOSPITAL_COMMUNITY): Admission: EM | Disposition: A | Discharge status: Skilled Nursing Facility | Payer: Self-pay | Source: Home / Self Care

## 2016-12-21 HISTORY — PX: I&D EXTREMITY: SHX5045

## 2016-12-21 LAB — PROTIME-INR
INR: 2.46
Prothrombin Time: 27.1 seconds — ABNORMAL HIGH (ref 11.4–15.2)

## 2016-12-21 LAB — CBC
HEMATOCRIT: 29 % — AB (ref 39.0–52.0)
Hemoglobin: 9.4 g/dL — ABNORMAL LOW (ref 13.0–17.0)
MCH: 28.7 pg (ref 26.0–34.0)
MCHC: 32.4 g/dL (ref 30.0–36.0)
MCV: 88.7 fL (ref 78.0–100.0)
PLATELETS: 393 10*3/uL (ref 150–400)
RBC: 3.27 MIL/uL — ABNORMAL LOW (ref 4.22–5.81)
RDW: 16.2 % — AB (ref 11.5–15.5)
WBC: 8.1 10*3/uL (ref 4.0–10.5)

## 2016-12-21 LAB — HEPARIN LEVEL (UNFRACTIONATED)
HEPARIN UNFRACTIONATED: 0.35 [IU]/mL (ref 0.30–0.70)
Heparin Unfractionated: 0.86 IU/mL — ABNORMAL HIGH (ref 0.30–0.70)

## 2016-12-21 SURGERY — IRRIGATION AND DEBRIDEMENT EXTREMITY
Anesthesia: General | Site: Pelvis

## 2016-12-21 MED ORDER — MIDAZOLAM HCL 2 MG/2ML IJ SOLN
INTRAMUSCULAR | Status: AC
  Filled 2016-12-21: qty 2

## 2016-12-21 MED ORDER — 0.9 % SODIUM CHLORIDE (POUR BTL) OPTIME
TOPICAL | Status: DC | PRN
  Administered 2016-12-21: 1000 mL

## 2016-12-21 MED ORDER — SODIUM CHLORIDE 0.9 % IR SOLN
Status: DC | PRN
  Administered 2016-12-21: 3000 mL

## 2016-12-21 MED ORDER — PROMETHAZINE HCL 25 MG/ML IJ SOLN: 6.2500 mg | INTRAMUSCULAR | Status: DC | PRN

## 2016-12-21 MED ORDER — HYDROMORPHONE HCL 1 MG/ML IJ SOLN: 0.2500 mg | INTRAMUSCULAR | Status: DC | PRN

## 2016-12-21 MED ORDER — EPHEDRINE SULFATE 50 MG/ML IJ SOLN
INTRAMUSCULAR | Status: DC | PRN
  Administered 2016-12-21 (×2): 10 mg via INTRAVENOUS

## 2016-12-21 MED ORDER — PROPOFOL 10 MG/ML IV BOLUS
INTRAVENOUS | Status: AC
  Filled 2016-12-21: qty 20

## 2016-12-21 MED ORDER — FENTANYL CITRATE (PF) 100 MCG/2ML IJ SOLN
INTRAMUSCULAR | Status: DC | PRN
  Administered 2016-12-21: 50 ug via INTRAVENOUS

## 2016-12-21 MED ORDER — LIDOCAINE HCL (CARDIAC) 20 MG/ML IV SOLN
INTRAVENOUS | Status: DC | PRN
  Administered 2016-12-21: 60 mg via INTRAVENOUS

## 2016-12-21 MED ORDER — SUGAMMADEX SODIUM 200 MG/2ML IV SOLN
INTRAVENOUS | Status: DC | PRN
  Administered 2016-12-21: 163.2 mg via INTRAVENOUS

## 2016-12-21 MED ORDER — ONDANSETRON HCL 4 MG/2ML IJ SOLN
INTRAMUSCULAR | Status: DC | PRN
  Administered 2016-12-21: 4 mg via INTRAVENOUS

## 2016-12-21 MED ORDER — ROCURONIUM BROMIDE 100 MG/10ML IV SOLN
INTRAVENOUS | Status: DC | PRN
Start: 2016-12-21 — End: 2016-12-21
  Administered 2016-12-21: 50 mg via INTRAVENOUS

## 2016-12-21 MED ORDER — PHENYLEPHRINE HCL 10 MG/ML IJ SOLN
INTRAMUSCULAR | Status: DC | PRN
  Administered 2016-12-21 (×2): 40 ug via INTRAVENOUS
  Administered 2016-12-21: 80 ug via INTRAVENOUS

## 2016-12-21 MED ORDER — FENTANYL CITRATE (PF) 250 MCG/5ML IJ SOLN
INTRAMUSCULAR | Status: AC
  Filled 2016-12-21: qty 5

## 2016-12-21 MED ORDER — MIDAZOLAM HCL 5 MG/5ML IJ SOLN
INTRAMUSCULAR | Status: DC | PRN
  Administered 2016-12-21: 1 mg via INTRAVENOUS

## 2016-12-21 MED ORDER — PHENYLEPHRINE HCL 10 MG/ML IJ SOLN
INTRAMUSCULAR | Status: DC | PRN
  Administered 2016-12-21: 50 ug/min via INTRAVENOUS

## 2016-12-21 MED ORDER — PROPOFOL 10 MG/ML IV BOLUS
INTRAVENOUS | Status: DC | PRN
  Administered 2016-12-21: 150 mg via INTRAVENOUS

## 2016-12-21 SURGICAL SUPPLY — 40 items
BNDG COHESIVE 4X5 TAN STRL (GAUZE/BANDAGES/DRESSINGS) ×2 IMPLANT
BNDG GAUZE ELAST 4 BULKY (GAUZE/BANDAGES/DRESSINGS) ×2 IMPLANT
BNDG GAUZE STRTCH 6 (GAUZE/BANDAGES/DRESSINGS) ×6 IMPLANT
BRUSH SCRUB SURG 4.25 DISP (MISCELLANEOUS) ×4 IMPLANT
COVER SURGICAL LIGHT HANDLE (MISCELLANEOUS) ×4 IMPLANT
DRAPE U-SHAPE 47X51 STRL (DRAPES) ×2 IMPLANT
DRSG ADAPTIC 3X8 NADH LF (GAUZE/BANDAGES/DRESSINGS) ×2 IMPLANT
DRSG MEPITEL 3X4 ME34 (GAUZE/BANDAGES/DRESSINGS) ×2 IMPLANT
ELECT REM PT RETURN 9FT ADLT (ELECTROSURGICAL)
ELECTRODE REM PT RTRN 9FT ADLT (ELECTROSURGICAL) IMPLANT
GAUZE SPONGE 4X4 12PLY STRL (GAUZE/BANDAGES/DRESSINGS) ×2 IMPLANT
GLOVE BIO SURGEON STRL SZ7.5 (GLOVE) ×2 IMPLANT
GLOVE BIO SURGEON STRL SZ8 (GLOVE) ×2 IMPLANT
GLOVE BIOGEL PI IND STRL 7.5 (GLOVE) ×1 IMPLANT
GLOVE BIOGEL PI IND STRL 8 (GLOVE) ×1 IMPLANT
GLOVE BIOGEL PI INDICATOR 7.5 (GLOVE) ×1
GLOVE BIOGEL PI INDICATOR 8 (GLOVE) ×1
GOWN STRL REUS W/ TWL LRG LVL3 (GOWN DISPOSABLE) ×2 IMPLANT
GOWN STRL REUS W/ TWL XL LVL3 (GOWN DISPOSABLE) ×1 IMPLANT
GOWN STRL REUS W/TWL LRG LVL3 (GOWN DISPOSABLE) ×2
GOWN STRL REUS W/TWL XL LVL3 (GOWN DISPOSABLE) ×1
HANDPIECE INTERPULSE COAX TIP (DISPOSABLE)
KIT BASIN OR (CUSTOM PROCEDURE TRAY) ×2 IMPLANT
KIT ROOM TURNOVER OR (KITS) ×2 IMPLANT
MANIFOLD NEPTUNE II (INSTRUMENTS) ×2 IMPLANT
NS IRRIG 1000ML POUR BTL (IV SOLUTION) ×2 IMPLANT
PACK ORTHO EXTREMITY (CUSTOM PROCEDURE TRAY) ×2 IMPLANT
PAD ARMBOARD 7.5X6 YLW CONV (MISCELLANEOUS) ×4 IMPLANT
PADDING CAST COTTON 6X4 STRL (CAST SUPPLIES) ×2 IMPLANT
SET HNDPC FAN SPRY TIP SCT (DISPOSABLE) IMPLANT
SPONGE LAP 18X18 X RAY DECT (DISPOSABLE) ×2 IMPLANT
STOCKINETTE IMPERVIOUS 9X36 MD (GAUZE/BANDAGES/DRESSINGS) ×2 IMPLANT
SUT PDS AB 2-0 CT1 27 (SUTURE) IMPLANT
SWAB CULTURE ESWAB REG 1ML (MISCELLANEOUS) IMPLANT
TOWEL OR 17X24 6PK STRL BLUE (TOWEL DISPOSABLE) ×2 IMPLANT
TOWEL OR 17X26 10 PK STRL BLUE (TOWEL DISPOSABLE) ×4 IMPLANT
TUBE CONNECTING 12X1/4 (SUCTIONS) ×2 IMPLANT
UNDERPAD 30X30 (UNDERPADS AND DIAPERS) ×2 IMPLANT
WATER STERILE IRR 1000ML POUR (IV SOLUTION) ×2 IMPLANT
YANKAUER SUCT BULB TIP NO VENT (SUCTIONS) ×2 IMPLANT

## 2016-12-21 NOTE — Anesthesia Preprocedure Evaluation (Signed)
Anesthesia Evaluation  Patient identified by MRN, date of birth, ID band Patient awake    Reviewed: Allergy & Precautions, NPO status , Patient's Chart, lab work & pertinent test results  Airway Mallampati: II  TM Distance: >3 FB Neck ROM: Full    Dental no notable dental hx.    Pulmonary neg pulmonary ROS,    Pulmonary exam normal breath sounds clear to auscultation       Cardiovascular negative cardio ROS Normal cardiovascular exam Rhythm:Regular Rate:Normal     Neuro/Psych CVA, No Residual Symptoms negative psych ROS   GI/Hepatic negative GI ROS, Neg liver ROS,   Endo/Other  Hypothyroidism   Renal/GU negative Renal ROS  negative genitourinary   Musculoskeletal negative musculoskeletal ROS (+)   Abdominal   Peds negative pediatric ROS (+)  Hematology  (+) anemia ,   Anesthesia Other Findings   Reproductive/Obstetrics negative OB ROS                             Anesthesia Physical Anesthesia Plan  ASA: III  Anesthesia Plan: General   Post-op Pain Management:    Induction: Intravenous  PONV Risk Score and Plan: 1 and Ondansetron and Dexamethasone  Airway Management Planned: Oral ETT  Additional Equipment:   Intra-op Plan:   Post-operative Plan: Extubation in OR  Informed Consent: I have reviewed the patients History and Physical, chart, labs and discussed the procedure including the risks, benefits and alternatives for the proposed anesthesia with the patient or authorized representative who has indicated his/her understanding and acceptance.   Dental advisory given  Plan Discussed with: CRNA and Surgeon  Anesthesia Plan Comments:         Anesthesia Quick Evaluation

## 2016-12-21 NOTE — Transfer of Care (Signed)
Immediate Anesthesia Transfer of Care Note  Patient: Caleb ArthursJohn J Dawson  Procedure(s) Performed: Procedure(s): IRRIGATION AND DEBRIDEMENT PELVIS WITH VAC CHANGE (N/A)  Patient Location: PACU  Anesthesia Type:General  Level of Consciousness: awake  Airway & Oxygen Therapy: Patient Spontanous Breathing and Patient connected to nasal cannula oxygen  Post-op Assessment: Report given to RN and Post -op Vital signs reviewed and stable  Post vital signs: Reviewed and stable  Last Vitals:  Vitals:   12/20/16 2011 12/21/16 0416  BP: 119/67 118/73  Pulse: 68 68  Resp: 18 18  Temp: 36.8 C 36.7 C    Last Pain:  Vitals:   12/21/16 0814  TempSrc:   PainSc: 8       Patients Stated Pain Goal: 3 (12/21/16 0230)  Complications: No apparent anesthesia complications

## 2016-12-21 NOTE — Progress Notes (Signed)
ANTICOAGULATION CONSULT NOTE - Follow Up Consult  Pharmacy Consult for Heparin (warfarin on hold) Indication: Mechancial AVR  Allergies  Allergen Reactions  . No Known Allergies     Patient Measurements: Height: 6\' 1"  (185.4 cm) Weight: 179 lb 14.3 oz (81.6 kg) IBW/kg (Calculated) : 79.9  Vital Signs: Temp: 97.8 F (36.6 C) (08/04 1958) Temp Source: Oral (08/04 1958) BP: 110/65 (08/04 1958) Pulse Rate: 70 (08/04 1958)  Labs:  Recent Labs  12/19/16 0442 12/20/16 0331  12/21/16 0404 12/21/16 1431 12/21/16 1941  HGB 9.6* 9.2*  --  9.4*  --   --   HCT 29.9* 28.9*  --  29.0*  --   --   PLT 425* 399  --  393  --   --   LABPROT 26.4* 27.4*  --  27.1*  --   --   INR 2.38 2.49  --  2.46  --   --   HEPARINUNFRC  --   --   < > 0.86* <0.10* 0.35  CREATININE  --  0.93  --   --   --   --   < > = values in this interval not displayed.  Estimated Creatinine Clearance: 90.7 mL/min (by C-G formula based on SCr of 0.93 mg/dL).  Assessment: 65 yom continues on IV heparin while warfarin on hold for surgery. Heparin level is now therapeutic at 0.35 after being briefly held. No bleeding noted.   Goal of Therapy:  Heparin level 0.3-0.7 units/ml Monitor platelets by anticoagulation protocol: Yes   Plan:  Continue heparin at 1700 units/hr. Daily heparin level and CBC  Lysle Pearlachel Ramon Zanders, PharmD, BCPS Pager # [phone] 12/21/2016 8:39 PM
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
## 2016-12-21 NOTE — Progress Notes (Signed)
Received call from CCMD.  Patient was in second degree heart block at 0320 appears to be new onset.  Contacted trauma on-call through [employer].  New new orders at this time.  Patient asymptomatic.

## 2016-12-21 NOTE — Progress Notes (Signed)
2 nd degree heart block  Asymptomatic currently Monitor and have pacing capabilities available  Will need cardiology to see for input

## 2016-12-21 NOTE — Progress Notes (Signed)
I discussed with the patient the risks and benefits of surgery, including the possibility of persistent infection, nerve injury, vessel injury, wound breakdown, arthritis, DVT/ PE, loss of motion, and need for further surgery among others.  He acknowledged these risks and wished to proceed.  Myrene GalasMichael Damascus Feldpausch, MD [facility] [phone] (p)

## 2016-12-21 NOTE — Anesthesia Postprocedure Evaluation (Signed)
Anesthesia Post Note  Patient: Caleb ArthursJohn J Dawson  Procedure(s) Performed: Procedure(s) (LRB): IRRIGATION AND DEBRIDEMENT PELVIS WITH VAC CHANGE (N/A)     Patient location during evaluation: PACU Anesthesia Type: General Level of consciousness: awake and alert Pain management: pain level controlled Vital Signs Assessment: post-procedure vital signs reviewed and stable Respiratory status: spontaneous breathing, nonlabored ventilation, respiratory function stable and patient connected to nasal cannula oxygen Cardiovascular status: blood pressure returned to baseline and stable Postop Assessment: no signs of nausea or vomiting Anesthetic complications: no    Last Vitals:  Vitals:   12/21/16 1147 12/21/16 1202  BP: 117/81 111/76  Pulse: 71 67  Resp: 11 10  Temp:      Last Pain:  Vitals:   12/21/16 1205  TempSrc:   PainSc: Asleep    LLE Motor Response: Purposeful movement (12/21/16 1200)   RLE Motor Response: Purposeful movement (12/21/16 1200)        Witney Huie S

## 2016-12-21 NOTE — Progress Notes (Signed)
OT Cancellation Note  Patient Details Name: Caleb ArthursJohn J Dawson MRN: [id number] DOB: February 17, 1951   Cancelled Treatment:    Reason Eval/Treat Not Completed: Patient at procedure or test/ unavailable. Attempted to fabricate splint this am. Pt apparently being taken for procedure. Will follow up Monday.   [facility] CenterWARD,[name]  Willett Lefeber, OT/L  [phone] 12/21/2016 12/21/2016, 8:33 AM

## 2016-12-21 NOTE — Progress Notes (Signed)
ANTICOAGULATION CONSULT NOTE - Follow Up Consult  Pharmacy Consult for Heparin (warfarin on hold) Indication: Mechancial AVR  Allergies  Allergen Reactions  . No Known Allergies     Patient Measurements: Height: 6\' 1"  (185.4 cm) Weight: 179 lb 14.3 oz (81.6 kg) IBW/kg (Calculated) : 79.9  Vital Signs: Temp: 97.2 F (36.2 C) (08/04 1252) Temp Source: Oral (08/04 0416) BP: 124/74 (08/04 1252) Pulse Rate: 73 (08/04 1252)  Labs:  Recent Labs  12/19/16 0442 12/20/16 0331  12/20/16 2239 12/21/16 0404 12/21/16 1431  HGB 9.6* 9.2*  --   --  9.4*  --   HCT 29.9* 28.9*  --   --  29.0*  --   PLT 425* 399  --   --  393  --   LABPROT 26.4* 27.4*  --   --  27.1*  --   INR 2.38 2.49  --   --  2.46  --   HEPARINUNFRC  --   --   < > 0.66 0.86* <0.10*  CREATININE  --  0.93  --   --   --   --   < > = values in this interval not displayed.  Estimated Creatinine Clearance: 90.7 mL/min (by C-G formula based on SCr of 0.93 mg/dL).    Assessment: Heparin while warfarin on hold for surgery. INR goal is 2.5-3.5. INR this AM was 2.49. Heparin was started at previously therapeutic rate of 1900 units/hr. Heparin level now therapeutic x 2.   Heparin was off today from 5 AM to 14PM due to I&D of pelvis.  Heparin had been decrease prior to this for elevated level at rate of 1700 units/hr.  Most recent heparin level is not accurate as heparin was just resumed.  No bleeding noted.   Goal of Therapy:  Heparin level 0.3-0.7 units/ml Monitor platelets by anticoagulation protocol: Yes   Plan:  Continue heparin at 1700 units/hr. Recheck heparin level in 6 hours.  Daily heparin level and CBC.  Plan for knee surgery on 8/7.  Follow-up restart of warfarin when able.   Link SnufferJessica Sadiyah Kangas, PharmD, BCPS Clinical Pharmacist Clinical phone 12/21/2016 until 3:30PM [phone]- [phone] After hours, please call [phone] 12/21/2016,3:38 PM

## 2016-12-21 NOTE — Anesthesia Procedure Notes (Addendum)
Procedure Name: Intubation Date/Time: 12/21/2016 9:48 AM Performed by: Clearnce Sorrel Pre-anesthesia Checklist: Patient identified, Emergency Drugs available, Suction available, Patient being monitored and Timeout performed Patient Re-evaluated:Patient Re-evaluated prior to induction Oxygen Delivery Method: Circle system utilized Preoxygenation: Pre-oxygenation with 100% oxygen Induction Type: IV induction Ventilation: Mask ventilation without difficulty Laryngoscope Size: Mac and 4 Grade View: Grade I Tube type: Oral Tube size: 7.5 mm Number of attempts: 1 Airway Equipment and Method: Stylet Placement Confirmation: ETT inserted through vocal cords under direct vision,  positive ETCO2 and breath sounds checked- equal and bilateral Secured at: 23 cm Tube secured with: Tape Dental Injury: Teeth and Oropharynx as per pre-operative assessment

## 2016-12-22 ENCOUNTER — Encounter (HOSPITAL_COMMUNITY): Payer: Self-pay | Admitting: Orthopedic Surgery

## 2016-12-22 LAB — CBC
HEMATOCRIT: 27.5 % — AB (ref 39.0–52.0)
HEMOGLOBIN: 8.8 g/dL — AB (ref 13.0–17.0)
MCH: 28.4 pg (ref 26.0–34.0)
MCHC: 32 g/dL (ref 30.0–36.0)
MCV: 88.7 fL (ref 78.0–100.0)
Platelets: 340 10*3/uL (ref 150–400)
RBC: 3.1 MIL/uL — AB (ref 4.22–5.81)
RDW: 16.3 % — ABNORMAL HIGH (ref 11.5–15.5)
WBC: 6.3 10*3/uL (ref 4.0–10.5)

## 2016-12-22 LAB — HEPARIN LEVEL (UNFRACTIONATED): Heparin Unfractionated: 0.47 IU/mL (ref 0.30–0.70)

## 2016-12-22 LAB — PROTIME-INR
INR: 2.02
Prothrombin Time: 23.2 seconds — ABNORMAL HIGH (ref 11.4–15.2)
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
## 2016-12-22 NOTE — Progress Notes (Signed)
ANTICOAGULATION CONSULT NOTE - Follow Up Consult  Pharmacy Consult for Heparin (warfarin on hold) Indication: Mechancial AVR  Allergies  Allergen Reactions  . No Known Allergies     Patient Measurements: Height: 6\' 1"  (185.4 cm) Weight: 179 lb 14.3 oz (81.6 kg) IBW/kg (Calculated) : 79.9  Vital Signs: Temp: 97.9 F (36.6 C) (08/05 0409) Temp Source: Oral (08/05 0409) BP: 119/76 (08/05 0409) Pulse Rate: 67 (08/05 0409)  Labs:  Recent Labs  12/20/16 0331  12/21/16 0404 12/21/16 1431 12/21/16 1941 12/22/16 0612  HGB 9.2*  --  9.4*  --   --  8.8*  HCT 28.9*  --  29.0*  --   --  27.5*  PLT 399  --  393  --   --  340  LABPROT 27.4*  --  27.1*  --   --  23.2*  INR 2.49  --  2.46  --   --  2.02  HEPARINUNFRC  --   < > 0.86* <0.10* 0.35 0.47  CREATININE 0.93  --   --   --   --   --   < > = values in this interval not displayed.  Estimated Creatinine Clearance: 90.7 mL/min (by C-G formula based on SCr of 0.93 mg/dL).    Assessment: 66 year old male on IV Heparin while Warfarin on hold for surgery.   Heparin level is therapeutic at 0.47 on 1700 units/hr.  H/H are down slightly (8.8/27.5) and platelets are within normal limits.   Goal of Therapy:  Heparin level 0.3-0.7 units/ml Monitor platelets by anticoagulation protocol: Yes   Plan:  Continue heparin at 1700 units/hr.  Daily heparin level and CBC.  Plan for surgical plan (knee surgery planned for 8/7). Follow-up restart of warfarin when able.   Link SnufferJessica Tagg Eustice, PharmD, BCPS Clinical Pharmacist Clinical phone 12/22/2016 until 3:30PM [phone]- [phone] After hours, please call [phone] 12/22/2016,8:53 AM

## 2016-12-22 NOTE — Progress Notes (Signed)
Skin improving both knees Need to continue with range of motion both knees while he is here in the hospital Due to bacterial infection in abdomen we will hold off on knee reconstruction for now Okay to weight-bear on left lower extremity for pivots and transfers with the Bledsoe brace locked in extension.

## 2016-12-22 NOTE — Progress Notes (Signed)
Pharmacy Antibiotic Note  Caleb Dawson is a 66 y.o. male admitted on 11/28/2016 with wound infection.  Pharmacy has been consulted for Vancomycin and Zosyn dosing.  Plan: Consider discontinuing Vancomycin with Morganella in wound culture.  For now, continue Vancomycin dose at 1g IV every 8 hours. Continue Zosyn 3.37g IV every 8 hours- each dose over 4 hr.  Height: 6\' 1"  (185.4 cm) Weight: 179 lb 14.3 oz (81.6 kg) IBW/kg (Calculated) : 79.9  Temp (24hrs), Avg:97.6 F (36.4 C), Min:97.2 F (36.2 C), Max:97.9 F (36.6 C)   Recent Labs Lab 12/18/16 0720 12/19/16 0442 12/20/16 0331 12/20/16 1701 12/21/16 0404 12/22/16 0612  WBC 7.8 8.3 9.0  --  8.1 6.3  CREATININE  --   --  0.93  --   --   --   VANCORANDOM  --   --   --  16  --   --     Estimated Creatinine Clearance: 90.7 mL/min (by C-G formula based on SCr of 0.93 mg/dL).    Allergies  Allergen Reactions  . No Known Allergies     Antimicrobials this admission: Zosyn 8/2 >> Vancomycin 8/2 >>  Dose adjustments this admission: 8/3 VT = 16 on 1g IV every 8 hours.   Microbiology results: 7/20 TA -normal flora 8/2 Abdominal wound- morganella, pending (holding for possible anaerobe)  Thank you for allowing pharmacy to be a part of this patient's care.  Link SnufferJessica Avyonna Wagoner, PharmD, BCPS Clinical Pharmacist Clinical phone 12/22/2016 until [phone] After hours, please call #[phone] 12/22/2016 11:21 AM

## 2016-12-22 NOTE — Progress Notes (Signed)
1 Day Post-Op   Subjective/Chief Complaint: No change   Denies CP SOB  Went back yesterday for vac change and washout    Objective: Vital signs in last 24 hours: Temp:  [97.2 F (36.2 C)-97.9 F (36.6 C)] 97.9 F (36.6 C) (08/05 0409) Pulse Rate:  [63-73] 67 (08/05 0409) Resp:  [10-18] 18 (08/05 0409) BP: (96-130)/(60-81) 119/76 (08/05 0409) SpO2:  [95 %-100 %] 100 % (08/05 0409) Last BM Date: 12/12/17  Intake/Output from previous day: 08/04 0701 - 08/05 0700 In: 1337 [P.O.:237; I.V.:1100] Out: 3901 [Urine:3700; Drains:125; Blood:1] Intake/Output this shift: No intake/output data recorded.  Resp: clear to auscultation bilaterally Cardio: clic from valve minimal murmur regular rate  Incision/Wound:vac in place   Soft ND   Lab Results:   Recent Labs  12/21/16 0404 12/22/16 0612  WBC 8.1 6.3  HGB 9.4* 8.8*  HCT 29.0* 27.5*  PLT 393 340   BMET  Recent Labs  12/20/16 0331  NA 132*  K 4.4  CL 98*  CO2 28  GLUCOSE 114*  BUN 14  CREATININE 0.93  CALCIUM 8.5*   PT/INR  Recent Labs  12/21/16 0404 12/22/16 0612  LABPROT 27.1* 23.2*  INR 2.46 2.02   ABG No results for input(s): PHART, HCO3 in the last 72 hours.  Invalid input(s): PCO2, PO2  Studies/Results: No results found.  Anti-infectives: Anti-infectives    Start     Dose/Rate Route Frequency Ordered Stop   12/20/16 0000  vancomycin (VANCOCIN) IVPB 1000 mg/200 mL premix     1,000 mg 200 mL/hr over 60 Minutes Intravenous Every 8 hours 12/19/16 1353     12/19/16 1400  piperacillin-tazobactam (ZOSYN) IVPB 3.375 g     3.375 g 12.5 mL/hr over 240 Minutes Intravenous Every 8 hours 12/19/16 1310     12/19/16 1400  vancomycin (VANCOCIN) 1,500 mg in sodium chloride 0.9 % 500 mL IVPB     1,500 mg 250 mL/hr over 120 Minutes Intravenous  Once 12/19/16 1353 12/19/16 1700   12/19/16 0845  piperacillin-tazobactam (ZOSYN) IVPB 3.375 g     3.375 g 100 mL/hr over 30 Minutes Intravenous To Surgery 12/19/16  0833 12/19/16 0900   12/10/16 0800  ceFAZolin (ANCEF) IVPB 2g/100 mL premix     2 g 200 mL/hr over 30 Minutes Intravenous  Once 12/10/16 0756 12/10/16 0847   12/10/16 0759  ceFAZolin (ANCEF) 2-4 GM/100ML-% IVPB    Comments:  Caleb Dawson, Caleb Dawson   : cabinet override      12/10/16 0759 12/10/16 0847   12/07/16 0730  ceFAZolin (ANCEF) IVPB 2g/100 mL premix     2 g 200 mL/hr over 30 Minutes Intravenous On call to O.R. 12/07/16 0718 12/07/16 1134   11/28/16 2200  ceFAZolin (ANCEF) IVPB 1 g/50 mL premix     1 g 100 mL/hr over 30 Minutes Intravenous Every 8 hours 11/28/16 1707 11/30/16 1439   11/28/16 1715  ceFAZolin (ANCEF) IVPB 2g/100 mL premix  Status:  Discontinued     2 g 200 mL/hr over 30 Minutes Intravenous Every 8 hours 11/28/16 1707 11/28/16 1726      Assessment/Plan: [facility]CC Intermittent 2nd degree AV block ask for cardiology input. 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  R APC pelvic ring FX- per Dr. Carola FrostHandy, S/P ex fix and SI screws 7/12, S/P Removal ex fix, ORIF anterior pubic symphysis 7/24;  - S/P pelvic washout 8/2 with evacuation of large pelvic hematoma and VAC application  - NWB RLE x 7  moreweeks, WBAT LLE for transfers only; bed to chair x2 mos R knee instability- per Dr. Mardene SpeakHandy/Dean; exam of BL knees under general anesthesia by Dr. August Saucerean 7/24; likely cancelled due to infection L knee- will need eventual surgery on knees bilaterally, WBAT LLE with knee brace locked in extension. -likely cancelled due to infection R talus FX, open L tibia FX- per Dr. Carola FrostHandy; s/p I&D, no fixation necessary, daily dressing changes R hand CMC dislocations- S/P pinning by Dr. Merlyn LotKuzma 7/21; WBAT through R elbow, NWB R hand T2 corner FX- collar ABL anemia- up S/P 2u PRBC 7/22, 2u PRBC 7/30 - hgb 9.4 this AM, recheck tomorrow AM Delirium- improved, trazodone started PM Dysphagia-reg diet now Penoscrotal edema/pelvic hematoma/hematuria/bladder spasms: -Dr. Berneice HeinrichManny consulted: recommends  continuing foley, continuing oxybutynin - decrease oxybutynin to BID FEN- Reg diet VTE- heparin stopped 7/30, coumadin (INR 2.38) ID- Zosyn periop 8/2   LOS: 24 days    Caleb Dawson A. 12/22/2016

## 2016-12-23 ENCOUNTER — Encounter (HOSPITAL_COMMUNITY): Payer: Self-pay | Admitting: Physician Assistant

## 2016-12-23 DIAGNOSIS — I441 Atrioventricular block, second degree: Secondary | ICD-10-CM

## 2016-12-23 LAB — PROTIME-INR
INR: 1.7
Prothrombin Time: 20.2 seconds — ABNORMAL HIGH (ref 11.4–15.2)

## 2016-12-23 LAB — CBC
HCT: 30.2 % — ABNORMAL LOW (ref 39.0–52.0)
Hemoglobin: 9.7 g/dL — ABNORMAL LOW (ref 13.0–17.0)
MCH: 28.5 pg (ref 26.0–34.0)
MCHC: 32.1 g/dL (ref 30.0–36.0)
MCV: 88.8 fL (ref 78.0–100.0)
PLATELETS: 332 10*3/uL (ref 150–400)
RBC: 3.4 MIL/uL — AB (ref 4.22–5.81)
RDW: 16 % — ABNORMAL HIGH (ref 11.5–15.5)
WBC: 6.1 10*3/uL (ref 4.0–10.5)

## 2016-12-23 LAB — HEPARIN LEVEL (UNFRACTIONATED)
Heparin Unfractionated: 0.78 IU/mL — ABNORMAL HIGH (ref 0.30–0.70)
Heparin Unfractionated: 0.84 [IU]/mL — ABNORMAL HIGH (ref 0.30–0.70)

## 2016-12-23 MED ORDER — DOCUSATE SODIUM 100 MG PO CAPS
100.0000 mg | ORAL_CAPSULE | Freq: Two times a day (BID) | ORAL | Status: DC
  Administered 2016-12-23 – 2017-01-01 (×17): 100 mg via ORAL
  Filled 2016-12-23 (×17): qty 1

## 2016-12-23 MED ORDER — PIPERACILLIN-TAZOBACTAM 3.375 G IVPB
3.3750 g | Freq: Three times a day (TID) | INTRAVENOUS | Status: DC
  Filled 2016-12-23 (×2): qty 50

## 2016-12-23 MED ORDER — PIPERACILLIN-TAZOBACTAM 3.375 G IVPB
3.3750 g | Freq: Three times a day (TID) | INTRAVENOUS | Status: DC
  Administered 2016-12-23 – 2016-12-27 (×12): 3.375 g via INTRAVENOUS
  Filled 2016-12-23 (×13): qty 50

## 2016-12-23 MED ORDER — VANCOMYCIN HCL IN DEXTROSE 1-5 GM/200ML-% IV SOLN
1000.0000 mg | Freq: Three times a day (TID) | INTRAVENOUS | Status: DC
  Administered 2016-12-23 – 2016-12-25 (×6): 1000 mg via INTRAVENOUS
  Filled 2016-12-23 (×7): qty 200

## 2016-12-23 MED ORDER — SODIUM CHLORIDE 0.9% FLUSH
10.0000 mL | INTRAVENOUS | Status: DC | PRN
  Administered 2017-01-01: 10 mL
  Filled 2016-12-23: qty 40

## 2016-12-23 MED ORDER — CEFEPIME HCL 2 G IJ SOLR
2.0000 g | Freq: Two times a day (BID) | INTRAMUSCULAR | Status: DC
  Filled 2016-12-23: qty 2

## 2016-12-23 MED ORDER — CHLORHEXIDINE GLUCONATE 0.12 % MT SOLN
OROMUCOSAL | Status: AC
  Administered 2016-12-23: 15 mL
  Filled 2016-12-23: qty 15

## 2016-12-23 MED ORDER — VANCOMYCIN HCL IN DEXTROSE 1-5 GM/200ML-% IV SOLN
1000.0000 mg | Freq: Three times a day (TID) | INTRAVENOUS | Status: DC
  Filled 2016-12-23 (×2): qty 200

## 2016-12-23 MED ORDER — POLYETHYLENE GLYCOL 3350 17 G PO PACK
17.0000 g | PACK | Freq: Every day | ORAL | Status: DC | PRN
  Administered 2016-12-25 – 2016-12-28 (×2): 17 g via ORAL
  Filled 2016-12-23 (×3): qty 1
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
## 2016-12-23 NOTE — Progress Notes (Signed)
PT Cancellation Note  Patient Details Name: Caleb ArthursJohn J Dawson MRN: [id number] DOB: 1950/12/09   Cancelled Treatment:    Reason Eval/Treat Not Completed: Patient at procedure or test/unavailable. Pt unavailable, OT fabricating splint at this time.   Lawanna Cecere, SPTA [phone]   Riggins Cisek 12/23/2016, 3:50 PM

## 2016-12-23 NOTE — Progress Notes (Signed)
Peripherally Inserted Central Catheter/Midline Placement  The IV Nurse has discussed with the patient and/or persons authorized to consent for the patient, the purpose of this procedure and the potential benefits and risks involved with this procedure.  The benefits include less needle sticks, lab draws from the catheter, and the patient may be discharged home with the catheter. Risks include, but not limited to, infection, bleeding, blood clot (thrombus formation), and puncture of an artery; nerve damage and irregular heartbeat and possibility to perform a PICC exchange if needed/ordered by physician.  Alternatives to this procedure were also discussed.  Bard Power PICC patient education guide, fact sheet on infection prevention and patient information card has been provided to patient /or left at bedside.    PICC/Midline Placement Documentation        Caleb Dawson, Caleb Dawson 12/23/2016, 1:11 PM Consent obtained by Reginia FortsMarilyn Lumban, RN

## 2016-12-23 NOTE — Clinical Social Work Note (Signed)
Clinical Social Worker continuing to follow patient and family for support and discharge planning needs.  CSW spoke with patient at bedside to complete SBIRT and further discuss patient rehab needs.  Patient with no current substance use concerns.  No resources needed.  Patient is in agreement with [place]hab in [place] and having follow up appointments with intention to return to [place] as soon as he is able.  CSW will contact patient emergency contact, Julianne RiceMary Bilotta, per patient request to further discuss potential SNF placement options.  CSW remains available for support and to facilitate patient discharge needs.  Caleb GoldsJesse Kyle Dawson, Kentucky[place]CS[place] [phone]
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
## 2016-12-23 NOTE — Progress Notes (Signed)
[facility] for Heparin Indication: mechanical AVR  Allergies  Allergen Reactions  . No Known Allergies     Patient Measurements: Height: 6' 1"  (185.4 cm) Weight: 179 lb 14.3 oz (81.6 kg) IBW/kg (Calculated) : 79.9  Vital Signs: Temp: 97.7 F (36.5 C) (08/06 0700) Temp Source: Oral (08/06 0700) BP: 129/75 (08/06 0700) Pulse Rate: 70 (08/06 0700)  Labs:  Recent Labs  12/21/16 0404  12/22/16 0612 12/23/16 0435 12/23/16 1626  HGB 9.4*  --  8.8* 9.7*  --   HCT 29.0*  --  27.5* 30.2*  --   PLT 393  --  340 332  --   LABPROT 27.1*  --  23.2* 20.2*  --   INR 2.46  --  2.02 1.70  --   HEPARINUNFRC 0.86*  < > 0.47 0.78* 0.84*  < > = values in this interval not displayed.  Estimated Creatinine Clearance: 90.7 mL/min (by C-G formula based on SCr of 0.93 mg/dL).   Medications:  Scheduled:  . acetaminophen  650 mg Oral Q6H  . chlorhexidine gluconate (MEDLINE KIT)  15 mL Mouth Rinse BID  . docusate sodium  100 mg Oral BID  . feeding supplement (PRO-STAT SUGAR FREE 64)  30 mL Oral TID BM  . ferrous gluconate  324 mg Oral BID WC  . levothyroxine  50 mcg Oral QAC breakfast  . methocarbamol  1,000 mg Oral TID  . multivitamin with minerals  1 tablet Oral Daily  . pantoprazole  40 mg Oral BID  . protein supplement shake  11 oz Oral BID BM  . traMADol  100 mg Oral Q6H  . traZODone  50 mg Oral QHS    Assessment: 66y o male on heparin bridge while warfarin on hold, surgery planned 8/7.  Heparin level remains supratherpeutic despite rate reduction this morning. Verified correct rate with RN and no issues with infusion. Unclear why level remains high.   Goal of Therapy:  Heparin level 0.3-0.7 units/ml Monitor platelets by anticoagulation protocol: Yes    Plan:  -Reduce heparin to 1450 units/hr -Daily HL, CBC -Check level again tonight    Hughes Better, PharmD, BCPS Clinical Pharmacist 12/23/2016 5:44 PM

## 2016-12-23 NOTE — Progress Notes (Signed)
ANTICOAGULATION CONSULT NOTE - Follow Up Consult  Pharmacy Consult for Heparin Indication: mechanical AVR  Allergies  Allergen Reactions  . No Known Allergies     Patient Measurements: Height: _0  (185.4 cm) Weight: 179 lb 14.3 oz (81.6 kg) IBW/kg (Calculated) : 79.9  Vital Signs: Temp: 97.7 F (36.5 C) (08/06 0700) Temp Source: Oral (08/06 0700) BP: 129/75 (08/06 0700) Pulse Rate: 70 (08/06 0700)  Labs:  Recent Labs  12/21/16 0404  12/21/16 1941 12/22/16 0612 12/23/16 0435  HGB 9.4*  --   --  8.8* 9.7*  HCT 29.0*  --   --  27.5* 30.2*  PLT 393  --   --  340 332  LABPROT 27.1*  --   --  23.2* 20.2*  INR 2.46  --   --  2.02 1.70  HEPARINUNFRC 0.86*  < > 0.35 0.47 0.78*  < > = values in this interval not displayed.  Estimated Creatinine Clearance: 90.7 mL/min (by C-G formula based on SCr of 0.93 mg/dL).   Medications:  Scheduled:  . acetaminophen  650 mg Oral Q6H  . chlorhexidine gluconate (MEDLINE KIT)  15 mL Mouth Rinse BID  . feeding supplement (PRO-STAT SUGAR FREE 64)  30 mL Oral TID BM  . ferrous gluconate  324 mg Oral BID WC  . levothyroxine  50 mcg Oral QAC breakfast  . methocarbamol  1,000 mg Oral TID  . multivitamin with minerals  1 tablet Oral Daily  . oxybutynin  5 mg Oral BID  . pantoprazole  40 mg Oral BID  . protein supplement shake  11 oz Oral BID BM  . traMADol  100 mg Oral Q6H  . traZODone  50 mg Oral QHS    Assessment: 66yo male on heparin bridge while Coumadin on hold, surgery planned 8/7.  Heparin level high this AM.  Hg is improved and pltc wnl.  No bleeding noted per d/w RN and pump is on correct rate.  Will adjust heparin rate.  Goal of Therapy:  Heparin level 0.3-0.7 units/ml Monitor platelets by anticoagulation protocol: Yes   Plan:  Decrease heparin to 1600 units/hr Repeat heparin level in 6hr Daily heparin level, CBC Watch for s/s of bleeding  Gracy Bruins, B.S., PharmD [facility]
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
## 2016-12-23 NOTE — Progress Notes (Signed)
OT Cancellation Note  Patient Details Name: Caleb ArthursJohn J Dawson MRN: [id number] DOB: [birth year]   Cancelled Treatment:    Reason Eval/Treat Not Completed: Other (comment). Pt scheduled to be taken to OR @ 11.  Will not have time to change dressing/fabricate splint this am. Will attempt to fabricate Tuesday am.   [name]  Abimael Zeiter, OT/L  [phone] 12/23/2016 12/23/2016, 9:19 AM
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
## 2016-12-24 ENCOUNTER — Inpatient Hospital Stay (HOSPITAL_COMMUNITY): Payer: BLUE CROSS/BLUE SHIELD | Admitting: Certified Registered Nurse Anesthetist

## 2016-12-24 ENCOUNTER — Encounter (HOSPITAL_COMMUNITY): Admission: EM | Disposition: A | Discharge status: Skilled Nursing Facility | Payer: Self-pay | Source: Home / Self Care

## 2016-12-24 ENCOUNTER — Encounter (HOSPITAL_COMMUNITY): Payer: Self-pay | Admitting: Certified Registered Nurse Anesthetist

## 2016-12-24 HISTORY — PX: INCISION AND DRAINAGE OF WOUND: SHX1803

## 2016-12-24 LAB — CBC
HCT: 29.9 % — ABNORMAL LOW (ref 39.0–52.0)
Hemoglobin: 9.7 g/dL — ABNORMAL LOW (ref 13.0–17.0)
MCH: 29.1 pg (ref 26.0–34.0)
MCHC: 32.4 g/dL (ref 30.0–36.0)
MCV: 89.8 fL (ref 78.0–100.0)
PLATELETS: 283 10*3/uL (ref 150–400)
RBC: 3.33 MIL/uL — AB (ref 4.22–5.81)
RDW: 16.5 % — AB (ref 11.5–15.5)
WBC: 5.1 10*3/uL (ref 4.0–10.5)

## 2016-12-24 LAB — PROTIME-INR
INR: 1.33
Prothrombin Time: 16.6 seconds — ABNORMAL HIGH (ref 11.4–15.2)

## 2016-12-24 LAB — HEPARIN LEVEL (UNFRACTIONATED)
HEPARIN UNFRACTIONATED: 0.46 [IU]/mL (ref 0.30–0.70)
Heparin Unfractionated: <0.1 IU/mL — ABNORMAL LOW (ref 0.30–0.70)

## 2016-12-24 LAB — MRSA PCR SCREENING: MRSA BY PCR: NEGATIVE

## 2016-12-24 SURGERY — RECONSTRUCTION, LIGAMENT, MEDIAL COLLATERAL, KNEE
Anesthesia: General | Site: Knee | Laterality: Left

## 2016-12-24 SURGERY — IRRIGATION AND DEBRIDEMENT WOUND
Anesthesia: General

## 2016-12-24 MED ORDER — TOBRAMYCIN SULFATE 80 MG/2ML IJ SOLN
INTRAMUSCULAR | Status: AC
  Filled 2016-12-24: qty 4

## 2016-12-24 MED ORDER — LIDOCAINE HCL (CARDIAC) 20 MG/ML IV SOLN
INTRAVENOUS | Status: DC | PRN
  Administered 2016-12-24: 100 mg via INTRAVENOUS

## 2016-12-24 MED ORDER — HYDROMORPHONE HCL 1 MG/ML IJ SOLN
0.2500 mg | INTRAMUSCULAR | Status: DC | PRN
  Administered 2016-12-24 (×2): 0.5 mg via INTRAVENOUS

## 2016-12-24 MED ORDER — LACTATED RINGERS IV SOLN
INTRAVENOUS | Status: DC
  Administered 2016-12-24 (×2): via INTRAVENOUS

## 2016-12-24 MED ORDER — VANCOMYCIN HCL 1000 MG IV SOLR
INTRAVENOUS | Status: DC | PRN
  Administered 2016-12-24: 1000 mg

## 2016-12-24 MED ORDER — VANCOMYCIN HCL 1000 MG IV SOLR
INTRAVENOUS | Status: AC
  Filled 2016-12-24: qty 1000

## 2016-12-24 MED ORDER — FENTANYL CITRATE (PF) 100 MCG/2ML IJ SOLN
INTRAMUSCULAR | Status: DC | PRN
  Administered 2016-12-24 (×3): 50 ug via INTRAVENOUS

## 2016-12-24 MED ORDER — PHENYLEPHRINE HCL 10 MG/ML IJ SOLN
INTRAVENOUS | Status: DC | PRN
  Administered 2016-12-24: 40 ug/min via INTRAVENOUS

## 2016-12-24 MED ORDER — PROMETHAZINE HCL 25 MG/ML IJ SOLN
INTRAMUSCULAR | Status: AC
  Filled 2016-12-24: qty 1

## 2016-12-24 MED ORDER — TOBRAMYCIN SULFATE 80 MG/2ML IJ SOLN
INTRAMUSCULAR | Status: DC | PRN
  Administered 2016-12-24: 160 mg via INTRAMUSCULAR

## 2016-12-24 MED ORDER — HYDROMORPHONE HCL 1 MG/ML IJ SOLN
INTRAMUSCULAR | Status: AC
  Administered 2016-12-24: 0.5 mg via INTRAVENOUS
  Filled 2016-12-24: qty 1

## 2016-12-24 MED ORDER — PROMETHAZINE HCL 25 MG/ML IJ SOLN: 6.2500 mg | INTRAMUSCULAR | Status: DC | PRN

## 2016-12-24 MED ORDER — SODIUM CHLORIDE 0.9 % IR SOLN
Status: DC | PRN
Start: 2016-12-24 — End: 2016-12-24
  Administered 2016-12-24 (×2): 3000 mL

## 2016-12-24 MED ORDER — PROPOFOL 10 MG/ML IV BOLUS
INTRAVENOUS | Status: AC
  Filled 2016-12-24: qty 20

## 2016-12-24 MED ORDER — DEXAMETHASONE SODIUM PHOSPHATE 10 MG/ML IJ SOLN
INTRAMUSCULAR | Status: DC | PRN
  Administered 2016-12-24: 5 mg via INTRAVENOUS

## 2016-12-24 MED ORDER — FENTANYL CITRATE (PF) 250 MCG/5ML IJ SOLN
INTRAMUSCULAR | Status: AC
  Filled 2016-12-24: qty 5

## 2016-12-24 MED ORDER — PHENYLEPHRINE 40 MCG/ML (10ML) SYRINGE FOR IV PUSH (FOR BLOOD PRESSURE SUPPORT)
PREFILLED_SYRINGE | INTRAVENOUS | Status: DC | PRN
  Administered 2016-12-24 (×2): 120 ug via INTRAVENOUS

## 2016-12-24 MED ORDER — 0.9 % SODIUM CHLORIDE (POUR BTL) OPTIME
TOPICAL | Status: DC | PRN
  Administered 2016-12-24: 1000 mL

## 2016-12-24 MED ORDER — MIDAZOLAM HCL 2 MG/2ML IJ SOLN
INTRAMUSCULAR | Status: AC
  Filled 2016-12-24: qty 2

## 2016-12-24 MED ORDER — MIDAZOLAM HCL 5 MG/5ML IJ SOLN
INTRAMUSCULAR | Status: DC | PRN
  Administered 2016-12-24: 2 mg via INTRAVENOUS

## 2016-12-24 MED ORDER — PROPOFOL 10 MG/ML IV BOLUS
INTRAVENOUS | Status: DC | PRN
  Administered 2016-12-24: 200 mg via INTRAVENOUS

## 2016-12-24 SURGICAL SUPPLY — 51 items
BNDG COHESIVE 4X5 TAN STRL (GAUZE/BANDAGES/DRESSINGS) ×3 IMPLANT
BNDG GAUZE ELAST 4 BULKY (GAUZE/BANDAGES/DRESSINGS) ×6 IMPLANT
BNDG GAUZE STRTCH 6 (GAUZE/BANDAGES/DRESSINGS) ×9 IMPLANT
BRUSH SCRUB SURG 4.25 DISP (MISCELLANEOUS) ×3 IMPLANT
COVER SURGICAL LIGHT HANDLE (MISCELLANEOUS) ×6 IMPLANT
DRAPE U-SHAPE 47X51 STRL (DRAPES) ×3 IMPLANT
DRSG ADAPTIC 3X8 NADH LF (GAUZE/BANDAGES/DRESSINGS) ×3 IMPLANT
DRSG MEPILEX BORDER 4X8 (GAUZE/BANDAGES/DRESSINGS) ×3 IMPLANT
ELECT REM PT RETURN 9FT ADLT (ELECTROSURGICAL)
ELECTRODE REM PT RTRN 9FT ADLT (ELECTROSURGICAL) IMPLANT
GAUZE SPONGE 4X4 12PLY STRL (GAUZE/BANDAGES/DRESSINGS) ×3 IMPLANT
GLOVE BIO SURGEON STRL SZ7.5 (GLOVE) ×3 IMPLANT
GLOVE BIO SURGEON STRL SZ8 (GLOVE) ×3 IMPLANT
GLOVE BIOGEL PI IND STRL 6.5 (GLOVE) ×1 IMPLANT
GLOVE BIOGEL PI IND STRL 7.5 (GLOVE) ×1 IMPLANT
GLOVE BIOGEL PI IND STRL 8 (GLOVE) ×2 IMPLANT
GLOVE BIOGEL PI INDICATOR 6.5 (GLOVE) ×2
GLOVE BIOGEL PI INDICATOR 7.5 (GLOVE) ×2
GLOVE BIOGEL PI INDICATOR 8 (GLOVE) ×4
GLOVE SURG SS PI 6.5 STRL IVOR (GLOVE) ×3 IMPLANT
GOWN STRL REUS W/ TWL LRG LVL3 (GOWN DISPOSABLE) ×2 IMPLANT
GOWN STRL REUS W/ TWL XL LVL3 (GOWN DISPOSABLE) ×1 IMPLANT
GOWN STRL REUS W/TWL LRG LVL3 (GOWN DISPOSABLE) ×4
GOWN STRL REUS W/TWL XL LVL3 (GOWN DISPOSABLE) ×2
HANDPIECE INTERPULSE COAX TIP (DISPOSABLE)
KIT BASIN OR (CUSTOM PROCEDURE TRAY) ×3 IMPLANT
KIT ROOM TURNOVER OR (KITS) ×3 IMPLANT
KIT STIMULAN RAPID CURE  10CC (Orthopedic Implant) ×2 IMPLANT
KIT STIMULAN RAPID CURE 10CC (Orthopedic Implant) ×1 IMPLANT
MANIFOLD NEPTUNE II (INSTRUMENTS) ×3 IMPLANT
NS IRRIG 1000ML POUR BTL (IV SOLUTION) ×3 IMPLANT
PACK ORTHO EXTREMITY (CUSTOM PROCEDURE TRAY) ×3 IMPLANT
PAD ARMBOARD 7.5X6 YLW CONV (MISCELLANEOUS) ×6 IMPLANT
SET HNDPC FAN SPRY TIP SCT (DISPOSABLE) IMPLANT
SPONGE LAP 18X18 X RAY DECT (DISPOSABLE) ×3 IMPLANT
STAPLER SKIN PROX WIDE 3.9 (STAPLE) ×3 IMPLANT
SUT ETHIBOND 2 0 FS (SUTURE) ×3 IMPLANT
SUT PDS AB 0 CT 36 (SUTURE) ×3 IMPLANT
SUT PDS AB 1 CT  36 (SUTURE) ×4
SUT PDS AB 1 CT 36 (SUTURE) ×2 IMPLANT
SUT PDS AB 2-0 CT1 27 (SUTURE) ×6 IMPLANT
SWAB CULTURE ESWAB REG 1ML (MISCELLANEOUS) IMPLANT
TOWEL OR 17X24 6PK STRL BLUE (TOWEL DISPOSABLE) ×3 IMPLANT
TOWEL OR 17X26 10 PK STRL BLUE (TOWEL DISPOSABLE) ×6 IMPLANT
TUBE CONNECTING 12'X1/4 (SUCTIONS) ×1
TUBE CONNECTING 12X1/4 (SUCTIONS) ×2 IMPLANT
TUBING BULK SUCTION (MISCELLANEOUS) ×3 IMPLANT
TUBING CYSTO DISP (UROLOGICAL SUPPLIES) ×3 IMPLANT
UNDERPAD 30X30 (UNDERPADS AND DIAPERS) ×3 IMPLANT
WATER STERILE IRR 1000ML POUR (IV SOLUTION) ×3 IMPLANT
YANKAUER SUCT BULB TIP NO VENT (SUCTIONS) ×3 IMPLANT

## 2016-12-24 NOTE — Progress Notes (Signed)
In surgery. Reviewed telemetry. He had one episode of second degree AV block, Mobitz type I (dropped one P wave only), 1.9 second pause. This occurred at 0508h, when he was probably asleep and is likely physiological. No change in recommendations. Plan implantable loop recorder when soft tissue infection is well controlled, but prior to hospital DC. Thurmon FairMihai Naidelin Gugliotta, MD, [facility] [phone] office [phone] pager

## 2016-12-24 NOTE — Progress Notes (Signed)
Inpatient Rehabilitation  Note patient currently out of the room for surgery. Continuing to follow at a distance pending change in weight bearing status, which will be needed in order to justify an IP Rehab stay at this time.  Note most current plans are for bed to chair transfers for 2 months.  Also note that current post acute rehab plans are for SNF with CSW following, which we are in agreement with at this time.  Please call with questions.    Charlane FerrettiMelissa Idabelle Mcpeters, M.A., CCC/SLP Admission Coordinator  [facility]  Cell [phone]

## 2016-12-24 NOTE — Anesthesia Postprocedure Evaluation (Signed)
Anesthesia Post Note  Patient: Caleb ArthursJohn J Dawson  Procedure(s) Performed: Procedure(s) (LRB): IRRIGATION AND DEBRIDEMENT WOUND WITH LAYERED CLOSURE (N/A)     Patient location during evaluation: PACU Anesthesia Type: General Level of consciousness: awake Pain management: pain level controlled Vital Signs Assessment: post-procedure vital signs reviewed and stable Respiratory status: spontaneous breathing Cardiovascular status: stable Postop Assessment: no signs of nausea or vomiting Anesthetic complications: no    Last Vitals:  Vitals:   12/24/16 1521 12/24/16 1530  BP: (!) 168/98   Pulse: 80 82  Resp: 16 (!) 21  Temp: 36.5 C     Last Pain:  Vitals:   12/24/16 1521  TempSrc:   PainSc: 10-Worst pain ever                 Anahit Klumb

## 2016-12-24 NOTE — Anesthesia Procedure Notes (Signed)
Procedure Name: LMA Insertion Date/Time: 12/24/2016 1:49 PM Performed by: Rogelia BogaMUELLER, Caterra Ostroff P Pre-anesthesia Checklist: Patient identified, Emergency Drugs available, Suction available, Patient being monitored and Timeout performed Patient Re-evaluated:Patient Re-evaluated prior to induction Oxygen Delivery Method: Circle system utilized Preoxygenation: Pre-oxygenation with 100% oxygen Induction Type: IV induction Ventilation: Mask ventilation without difficulty LMA: LMA inserted LMA Size: 4.0 Number of attempts: 1 Placement Confirmation: positive ETCO2 and breath sounds checked- equal and bilateral Tube secured with: Tape Dental Injury: Teeth and Oropharynx as per pre-operative assessment
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
## 2016-12-24 NOTE — Progress Notes (Signed)
Pt transferred to surgery in stable condition via unit bed, transporters x 1 @ bedside. AKingRN
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
## 2016-12-24 NOTE — Progress Notes (Signed)
Abdomen improving Return to see me in 2 weeks to schedule bilateral knee surgeries Continue range of motion exercises and straight leg raises in the meantime
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
## 2016-12-24 NOTE — Progress Notes (Signed)
Planning for closure today. Has been doing well.   I discussed with the patient the risks and benefits of surgery, including the possibility of infection or failure to resolve infection, nerve injury, vessel injury, wound breakdown, arthritis, symptomatic hardware, DVT/ PE, loss of motion, malunion, nonunion, and need for further surgery among others. He acknowledged these risks and wished to proceed.  Myrene GalasMichael Geordan Xu, MD [facility] [phone] (p)

## 2016-12-24 NOTE — Transfer of Care (Signed)
Immediate Anesthesia Transfer of Care Note  Patient: Toni ArthursJohn J Loving  Procedure(s) Performed: Procedure(s): IRRIGATION AND DEBRIDEMENT WOUND WITH LAYERED CLOSURE (N/A)  Patient Location: PACU  Anesthesia Type:General  Level of Consciousness: awake, alert , oriented and patient cooperative  Airway & Oxygen Therapy: Patient Spontanous Breathing  Post-op Assessment: Report given to RN and Post -op Vital signs reviewed and stable  Post vital signs: Reviewed and stable  Last Vitals:  Vitals:   12/24/16 0628 12/24/16 1521  BP: 103/73 (!) 168/98  Pulse: 64 80  Resp: 18 16  Temp: (!) 36.4 C     Last Pain:  Vitals:   12/24/16 0628  TempSrc: Oral  PainSc:       Patients Stated Pain Goal: 3 (12/21/16 0230)  Complications: No apparent anesthesia complications

## 2016-12-24 NOTE — Progress Notes (Signed)
ANTICOAGULATION CONSULT NOTE  Pharmacy Consult for Heparin Indication: mechanical AVR  Allergies  Allergen Reactions  . No Known Allergies     Patient Measurements: Height: 6\' 1"  (185.4 cm) Weight: 179 lb 14.3 oz (81.6 kg) IBW/kg (Calculated) : 79.9  Vital Signs: Temp: 98 F (36.7 C) (08/06 2055) Temp Source: Oral (08/06 2055) BP: 121/76 (08/06 2055) Pulse Rate: 66 (08/06 2055)  Labs:  Recent Labs  12/21/16 0404  12/22/16 0612 12/23/16 0435 12/23/16 1626 12/24/16 0020  HGB 9.4*  --  8.8* 9.7*  --   --   HCT 29.0*  --  27.5* 30.2*  --   --   PLT 393  --  340 332  --   --   LABPROT 27.1*  --  23.2* 20.2*  --   --   INR 2.46  --  2.02 1.70  --   --   HEPARINUNFRC 0.86*  < > 0.47 0.78* 0.84* 0.46  < > = values in this interval not displayed.  Estimated Creatinine Clearance: 90.7 mL/min (by C-G formula based on SCr of 0.93 mg/dL).  Assessment: 66 y.o. male with h/o AVR, Coumadin on hold, for heparin Goal of Therapy:  Heparin level 0.3-0.7 units/ml Monitor platelets by anticoagulation protocol: Yes   Plan:  Continue Heparin at current rate  F/U after OR  Geannie RisenGreg Marcy Bogosian, PharmD, BCPS  12/24/2016 1:12 AM

## 2016-12-24 NOTE — Brief Op Note (Signed)
11/28/2016 - 12/24/2016  3:25 PM  PATIENT:  Caleb ArthursJohn J Dawson  66 y.o. male  PRE-OPERATIVE DIAGNOSIS:  INFECTION PELVIS  POST-OPERATIVE DIAGNOSIS:  INFECTION PELVIS  PROCEDURE:  Procedure(s): 1. IRRIGATION AND EXCISIONAL DEBRIDEMENT WOUND INCLUDING SKIN, SUBCUTANEOUS TISSUE AND DEEP FASCIA 2. LAYERED CLOSURE (N/A) 10 CM 3. PLACEMENT OF ANTIBIOTIC BEADS, VANCOMYCIN AND TOBRAMYCIN  SURGEON:  Surgeon(s) and Role:    Myrene Galas* Almus Woodham, MD - Primary  ASSISTANTS: none   ANESTHESIA:   general  EBL:  Total I/O In: 1100 [I.V.:1100] Out: 625 [Urine:600; Blood:25]  BLOOD ADMINISTERED:none  DRAINS: none   LOCAL MEDICATIONS USED:  NONE  SPECIMEN:  No Specimen  DISPOSITION OF SPECIMEN:  N/A  COUNTS:  YES  TOURNIQUET:  * No tourniquets in log *  DICTATIO: [id number]: [id number]  PLAN OF CARE: Admit to inpatient   PATIENT DISPOSITION:  PACU - hemodynamically stable.   Delay start of Pharmacological VTE agent (>24hrs) due to surgical blood loss or risk of bleeding: no

## 2016-12-24 NOTE — Progress Notes (Signed)
ANTICOAGULATION CONSULT NOTE  Pharmacy Consult for Heparin Indication: mechanical AVR  Allergies  Allergen Reactions  . No Known Allergies     Patient Measurements: Height: 6\' 1"  (185.4 cm) Weight: 179 lb 14.3 oz (81.6 kg) IBW/kg (Calculated) : 79.9  Vital Signs: Temp: 98 F (36.7 C) (08/06 2055) Temp Source: Oral (08/06 2055) BP: 121/76 (08/06 2055) Pulse Rate: 66 (08/06 2055)  Labs:  Recent Labs  12/22/16 0612 12/23/16 0435 12/23/16 1626 12/24/16 0020 12/24/16 0358  HGB 8.8* 9.7*  --   --   --   HCT 27.5* 30.2*  --   --   --   PLT 340 332  --   --   --   LABPROT 23.2* 20.2*  --   --  16.6*  INR 2.02 1.70  --   --  1.33  HEPARINUNFRC 0.47 0.78* 0.84* 0.46 <0.10*    Estimated Creatinine Clearance: 90.7 mL/min (by C-G formula based on SCr of 0.93 mg/dL).  Assessment: 66 y.o. male with h/o AVR, Coumadin on hold, for heparin per pharmacy.   Level this morning is undetectable, however RN reports infusion was off for 1-2 hrs. Patient for I&D surgery ~12 noon today. Infusion resumed per RN at previous rate (1450 units/hr). CBC low, stable from 8/6. No s/s bleeding noted.   Goal of Therapy:  Heparin level 0.3-0.7 units/ml Monitor platelets by anticoagulation protocol: Yes   Plan:  Continue heparin gtt at 1450 units/hr (previously therapeutic at this rate) Daily heparin level and CBC Monitor for bleeding  F/U after OR  York CeriseKatherine Cook, PharmD Clinical Pharmacist 12/24/16 6:35 AM

## 2016-12-24 NOTE — Anesthesia Preprocedure Evaluation (Addendum)
Anesthesia Evaluation  Patient identified by MRN, date of birth, ID band Patient awake    Reviewed: Allergy & Precautions, NPO status , Patient's Chart, lab work & pertinent test results  Airway Mallampati: II  TM Distance: >3 FB Neck ROM: Full    Dental no notable dental hx. (+) Teeth Intact, Dental Advisory Given   Pulmonary neg pulmonary ROS,    Pulmonary exam normal breath sounds clear to auscultation       Cardiovascular negative cardio ROS Normal cardiovascular exam Rhythm:Regular Rate:Normal     Neuro/Psych CVA, No Residual Symptoms negative psych ROS   GI/Hepatic negative GI ROS, Neg liver ROS,   Endo/Other  Hypothyroidism   Renal/GU negative Renal ROS  negative genitourinary   Musculoskeletal negative musculoskeletal ROS (+)   Abdominal   Peds negative pediatric ROS (+)  Hematology  (+) anemia ,   Anesthesia Other Findings   Reproductive/Obstetrics negative OB ROS                            Anesthesia Physical Anesthesia Plan  ASA: III  Anesthesia Plan: General   Post-op Pain Management:    Induction: Intravenous  PONV Risk Score and Plan: 3 and Ondansetron, Dexamethasone, Midazolam and Propofol infusion  Airway Management Planned: Oral ETT and LMA  Additional Equipment:   Intra-op Plan:   Post-operative Plan: Extubation in OR  Informed Consent: I have reviewed the patients History and Physical, chart, labs and discussed the procedure including the risks, benefits and alternatives for the proposed anesthesia with the patient or authorized representative who has indicated his/her understanding and acceptance.   Dental advisory given  Plan Discussed with: CRNA, Anesthesiologist and Surgeon  Anesthesia Plan Comments:        Anesthesia Quick Evaluation

## 2016-12-24 NOTE — Clinical Social Work Note (Signed)
Clinical Social Worker continuing to follow patient and family for support and discharge planning needs.  CSW contacted patient friend, Julianne RiceMary Bilotta, to discuss rehab options per patient request.  CSW spoke with [name] over the phone who states that she is in a meeting and waiting to hear from Dr. Carola FrostHandy about patient surgery - she will not be available until after 5pm.  CSW to attempt again tomorrow.  CSW remains available for support and to facilitate patient discharge needs once medically stable.  Macario GoldsJesse Caysen Whang, Kentucky[place] [phone]
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
## 2016-12-25 ENCOUNTER — Inpatient Hospital Stay (HOSPITAL_COMMUNITY): Payer: BLUE CROSS/BLUE SHIELD

## 2016-12-25 ENCOUNTER — Encounter (HOSPITAL_COMMUNITY): Payer: Self-pay | Admitting: Orthopedic Surgery

## 2016-12-25 DIAGNOSIS — I38 Endocarditis, valve unspecified: Secondary | ICD-10-CM

## 2016-12-25 DIAGNOSIS — T847XXA Infection and inflammatory reaction due to other internal orthopedic prosthetic devices, implants and grafts, initial encounter: Secondary | ICD-10-CM

## 2016-12-25 DIAGNOSIS — M86151 Other acute osteomyelitis, right femur: Secondary | ICD-10-CM

## 2016-12-25 DIAGNOSIS — A499 Bacterial infection, unspecified: Secondary | ICD-10-CM

## 2016-12-25 DIAGNOSIS — T826XXA Infection and inflammatory reaction due to cardiac valve prosthesis, initial encounter: Secondary | ICD-10-CM

## 2016-12-25 DIAGNOSIS — I350 Nonrheumatic aortic (valve) stenosis: Secondary | ICD-10-CM

## 2016-12-25 LAB — BASIC METABOLIC PANEL
Anion gap: 8 (ref 5–15)
BUN: 12 mg/dL (ref 6–20)
CALCIUM: 8.9 mg/dL (ref 8.9–10.3)
CHLORIDE: 99 mmol/L — AB (ref 101–111)
CO2: 25 mmol/L (ref 22–32)
CREATININE: 0.84 mg/dL (ref 0.61–1.24)
GFR calc non Af Amer: >60 mL/min (ref >60)
GLUCOSE: 118 mg/dL — AB (ref 65–99)
Potassium: 4.1 mmol/L (ref 3.5–5.1)
Sodium: 132 mmol/L — ABNORMAL LOW (ref 135–145)

## 2016-12-25 LAB — HEPARIN LEVEL (UNFRACTIONATED)
HEPARIN UNFRACTIONATED: 0.48 [IU]/mL (ref 0.30–0.70)
Heparin Unfractionated: <0.1 IU/mL — ABNORMAL LOW (ref 0.30–0.70)
Heparin Unfractionated: 0.42 [IU]/mL (ref 0.30–0.70)

## 2016-12-25 LAB — CBC
HEMATOCRIT: 30.4 % — AB (ref 39.0–52.0)
Hemoglobin: 9.7 g/dL — ABNORMAL LOW (ref 13.0–17.0)
MCH: 28.3 pg (ref 26.0–34.0)
MCHC: 31.9 g/dL (ref 30.0–36.0)
MCV: 88.6 fL (ref 78.0–100.0)
PLATELETS: 291 10*3/uL (ref 150–400)
RBC: 3.43 MIL/uL — ABNORMAL LOW (ref 4.22–5.81)
RDW: 16.1 % — AB (ref 11.5–15.5)
WBC: 7.8 10*3/uL (ref 4.0–10.5)

## 2016-12-25 LAB — ECHOCARDIOGRAM COMPLETE
HEIGHTINCHES: 73 in
Weight: 2878.33 oz

## 2016-12-25 LAB — PROTIME-INR
INR: 1.31
Prothrombin Time: 16.4 seconds — ABNORMAL HIGH (ref 11.4–15.2)

## 2016-12-25 MED ORDER — WARFARIN SODIUM 5 MG PO TABS
10.0000 mg | ORAL_TABLET | Freq: Once | ORAL | Status: AC
  Administered 2016-12-25: 10 mg via ORAL
  Filled 2016-12-25: qty 2

## 2016-12-25 MED ORDER — PERFLUTREN LIPID MICROSPHERE
1.0000 mL | INTRAVENOUS | Status: AC | PRN
  Administered 2016-12-25: 2 mL via INTRAVENOUS
  Filled 2016-12-25: qty 10

## 2016-12-25 MED ORDER — HEPARIN (PORCINE) IN NACL 100-0.45 UNIT/ML-% IJ SOLN
1200.0000 [IU]/h | INTRAMUSCULAR | Status: DC
  Administered 2016-12-25 – 2016-12-26 (×2): 1450 [IU]/h via INTRAVENOUS
  Administered 2016-12-27 – 2016-12-31 (×5): 1200 [IU]/h via INTRAVENOUS
  Filled 2016-12-25 (×9): qty 250

## 2016-12-25 MED ORDER — WARFARIN - PHARMACIST DOSING INPATIENT: Freq: Every day | Status: DC
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
## 2016-12-25 NOTE — Progress Notes (Signed)
ANTICOAGULATION CONSULT NOTE - Follow Up Consult  Pharmacy Consult:  Heparin / Coumadin Indication: mechanical AVR  Allergies  Allergen Reactions  . No Known Allergies     Patient Measurements: Height: 6\' 1"  (185.4 cm) Weight: 179 lb 14.3 oz (81.6 kg) IBW/kg (Calculated) : 79.9  Vital Signs: Temp: 97.7 F (36.5 C) (08/08 0455) Temp Source: Oral (08/08 0455) BP: 106/66 (08/08 0455) Pulse Rate: 64 (08/08 0455)  Labs:  Recent Labs  12/23/16 0435  12/24/16 0020 12/24/16 0358 12/24/16 0537 12/25/16 0418  HGB 9.7*  --   --   --  9.7* 9.7*  HCT 30.2*  --   --   --  29.9* 30.4*  PLT 332  --   --   --  283 291  LABPROT 20.2*  --   --  16.6*  --  16.4*  INR 1.70  --   --  1.33  --  1.31  HEPARINUNFRC 0.78*  < > 0.46 <0.10*  --  <0.10*  CREATININE  --   --   --   --   --  0.84  < > = values in this interval not displayed.  Estimated Creatinine Clearance: 100.4 mL/min (by C-G formula based on SCr of 0.84 mg/dL).    Assessment: [age] YOM with history of mechanical AVR on Coumadin PTA.  Patient was transitioned to IV heparin for surgery on 12/24/16.  Anticoagulation has been on hold since 12/24/16.  My colleague spoke to Surgery early this AM regarding resuming anticoagulation and was instructed to check with Trauma but okay to resume.  Spoke to Trauma PA, will resume heparin bridge to Coumadin.  Confirmed with RN that patient is not having any sign and symptom of bleeding.  Patient's INR and heparin level are sub-therapeutic as expected.    Goal of Therapy:  INR 2.5 - 3.5 Heparin level 0.3 - 0.7 units/mL Monitor platelets by anticoagulation protocol: Yes     Plan:  Resume heparin gtt at 1450 units/hr, no bolus post surgery Recheck 6 hr heparin level Coumadin 10mg  PO today Daily PT / INR, heparin level, CBC Monitor for s/sx of bleeding F/U with de-escalating abx   Naia Ruff D. Laney Potashang, PharmD, BCPS Pager:  [phone] 12/25/2016, 8:26 AM
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
## 2016-12-25 NOTE — Progress Notes (Signed)
ANTICOAGULATION CONSULT NOTE - Follow Up Consult  Pharmacy Consult:  Heparin / Coumadin Indication: mechanical AVR  Allergies  Allergen Reactions  . No Known Allergies     Patient Measurements: Height: 6\' 1"  (185.4 cm) Weight: 179 lb 14.3 oz (81.6 kg) IBW/kg (Calculated) : 79.9  Assessment: 65 YOM with history of mechanical AVR on Coumadin PTA.  Patient was transitioned to IV heparin for surgery on 12/24/16.   Heparin and coumadin restarted 8/8. Heparin level remains at goal.  Goal of Therapy:  INR 2.5 - 3.5 Heparin level 0.3 - 0.7 units/mL Monitor platelets by anticoagulation protocol: Yes    Plan:  Continue heparin gtt 1,450 units/hr Monitor daily heparin level, CBC, s/s of bleed  Caleb Dawson, PharmD, BCPS Clinical Pharmacist Pager [phone] 12/25/2016 10:15 PM

## 2016-12-25 NOTE — Progress Notes (Signed)
  Echocardiogram 2D Echocardiogram with Definity has been performed.  Caleb Dawson, Caleb Dawson 12/25/2016, 9:55 AM

## 2016-12-25 NOTE — Progress Notes (Signed)
ANTICOAGULATION CONSULT NOTE - Follow Up Consult  Pharmacy Consult:  Heparin / Coumadin Indication: mechanical AVR  Allergies  Allergen Reactions  . No Known Allergies     Patient Measurements: Height: 6\' 1"  (185.4 cm) Weight: 179 lb 14.3 oz (81.6 kg) IBW/kg (Calculated) : 79.9  Vital Signs: Temp: 97.9 F (36.6 C) (08/08 1230) Temp Source: Oral (08/08 1230) BP: 111/67 (08/08 1230) Pulse Rate: 73 (08/08 1230)  Labs:  Recent Labs  12/23/16 0435  12/24/16 0358 12/24/16 0537 12/25/16 0418 12/25/16 1554  HGB 9.7*  --   --  9.7* 9.7*  --   HCT 30.2*  --   --  29.9* 30.4*  --   PLT 332  --   --  283 291  --   LABPROT 20.2*  --  16.6*  --  16.4*  --   INR 1.70  --  1.33  --  1.31  --   HEPARINUNFRC 0.78*  < > <0.10*  --  <0.10* 0.42  CREATININE  --   --   --   --  0.84  --   < > = values in this interval not displayed.  Estimated Creatinine Clearance: 100.4 mL/min (by C-G formula based on SCr of 0.84 mg/dL).    Assessment: [age] YOM with history of mechanical AVR on Coumadin PTA.  Patient was transitioned to IV heparin for surgery on 12/24/16.   Heparin and coumadin restarted 8/8 -heparin level is at goal    Goal of Therapy:  INR 2.5 - 3.5 Heparin level 0.3 - 0.7 units/mL Monitor platelets by anticoagulation protocol: Yes     Plan:  -No heparin changes needed -Heparin level in 6 hours and daily wth CBC daily  Harland Germanndrew Reilynn Lauro, Pharm D 12/25/2016 5:48 PM

## 2016-12-26 DIAGNOSIS — I441 Atrioventricular block, second degree: Secondary | ICD-10-CM

## 2016-12-26 DIAGNOSIS — T847XXD Infection and inflammatory reaction due to other internal orthopedic prosthetic devices, implants and grafts, subsequent encounter: Secondary | ICD-10-CM

## 2016-12-26 DIAGNOSIS — T826XXD Infection and inflammatory reaction due to cardiac valve prosthesis, subsequent encounter: Secondary | ICD-10-CM

## 2016-12-26 LAB — AEROBIC/ANAEROBIC CULTURE (SURGICAL/DEEP WOUND)

## 2016-12-26 LAB — CBC
HCT: 30.4 % — ABNORMAL LOW (ref 39.0–52.0)
HEMOGLOBIN: 9.8 g/dL — AB (ref 13.0–17.0)
MCH: 28.8 pg (ref 26.0–34.0)
MCHC: 32.2 g/dL (ref 30.0–36.0)
MCV: 89.4 fL (ref 78.0–100.0)
Platelets: 261 10*3/uL (ref 150–400)
RBC: 3.4 MIL/uL — AB (ref 4.22–5.81)
RDW: 16.8 % — ABNORMAL HIGH (ref 11.5–15.5)
WBC: 7 10*3/uL (ref 4.0–10.5)

## 2016-12-26 LAB — PROTIME-INR
INR: 1.26
PROTHROMBIN TIME: 15.9 s — AB (ref 11.4–15.2)

## 2016-12-26 LAB — SEDIMENTATION RATE: SED RATE: 35 mm/h — AB (ref 0–16)

## 2016-12-26 LAB — HEPARIN LEVEL (UNFRACTIONATED): HEPARIN UNFRACTIONATED: 0.67 [IU]/mL (ref 0.30–0.70)

## 2016-12-26 LAB — AEROBIC/ANAEROBIC CULTURE W GRAM STAIN (SURGICAL/DEEP WOUND)

## 2016-12-26 LAB — C-REACTIVE PROTEIN: CRP: 1.5 mg/dL — AB (ref <1.0)

## 2016-12-26 MED ORDER — HYDROMORPHONE HCL 1 MG/ML IJ SOLN: 0.5000 mg | INTRAMUSCULAR | Status: DC | PRN

## 2016-12-26 MED ORDER — OXYCODONE HCL 5 MG PO TABS
5.0000 mg | ORAL_TABLET | ORAL | Status: DC | PRN
  Administered 2016-12-30: 10 mg via ORAL
  Filled 2016-12-26: qty 2

## 2016-12-26 MED ORDER — SODIUM CHLORIDE 0.9 % IV SOLN
INTRAVENOUS | Status: DC
  Administered 2016-12-26: 21:00:00 via INTRAVENOUS

## 2016-12-26 MED ORDER — WARFARIN SODIUM 5 MG PO TABS
10.0000 mg | ORAL_TABLET | Freq: Once | ORAL | Status: AC
  Administered 2016-12-26: 10 mg via ORAL
  Filled 2016-12-26: qty 2
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
## 2016-12-26 NOTE — Progress Notes (Signed)
ANTICOAGULATION & ANTIBIOTIC CONSULT NOTE - Follow Up Consult  Pharmacy Consult:  Heparin / Coumadin + Zosyn Indication: mechanical AVR + Wound infection  Allergies  Allergen Reactions  . No Known Allergies     Patient Measurements: Height: 6\' 1"  (185.4 cm) Weight: 179 lb 14.3 oz (81.6 kg) IBW/kg (Calculated) : 79.9  Vital Signs:    Labs:  Recent Labs  12/24/16 0358  12/24/16 0537 12/25/16 0418 12/25/16 1554 12/25/16 2132 12/26/16 0402  HGB  --   < > 9.7* 9.7*  --   --  9.8*  HCT  --   --  29.9* 30.4*  --   --  30.4*  PLT  --   --  283 291  --   --  261  LABPROT 16.6*  --   --  16.4*  --   --  15.9*  INR 1.33  --   --  1.31  --   --  1.26  HEPARINUNFRC <0.10*  --   --  <0.10* 0.42 0.48 0.67  CREATININE  --   --   --  0.84  --   --   --   < > = values in this interval not displayed.  Estimated Creatinine Clearance: 100.4 mL/min (by C-G formula based on SCr of 0.84 mg/dL).    Assessment: [age] YOM with history of mechanical AVR, s/p surgery on 12/24/16, to continue on IV heparin bridge to Coumadin.  Heparin level is therapeutic and toward the high end of normal.  INR sub-therapeutic as expected.  No bleeding per RN.   Pharmacy also managing Zosyn for deep wound infection.  Patient's renal function has been stable.  He is afebrile and his WBC is WNL.  ID recommends TEE.  Zosyn 8/2 >> Vanc 8/2 >> 8/8  8/3 VT = 16 on 1g q8h, continue but not steady state level per Ruth's note  7/20 TA - normal flora 7/12 MRSA - NEG 8/2 abd wound - Morganella morganii (S Zosyn, cefepime, ctx) 8/4 abscess - GPC on Gram stain + Finegoldia magna 8/9 BCx -    Goal of Therapy:  INR 2.5 - 3.5 Heparin level 0.3 - 0.7 units/mL Monitor platelets by anticoagulation protocol: Yes     Plan:  Reduce heparin gtt slightly to 1400 units/hr Coumadin 10mg  PO today Daily PT / INR, heparin level, CBC Monitor for s/sx of bleeding  Continue Zosyn 3.375gm IV Q8H, 4 hr infusion Pharmacy will sign  off and follow peripherally.  Thank you for the consult!    Herndon Grill D. Laney Potashang, PharmD, BCPS Pager:  [phone] 12/26/2016, 12:41 PM
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
## 2016-12-26 NOTE — Progress Notes (Signed)
Inpatient Rehabilitation  Given weight bearing restrictions agree with team's plan for SNF for post acute therapy needs.  Will sign off at this time.  Call if questions.   Charlane FerrettiMelissa Camerin Ladouceur, M.A., CCC/SLP Admission Coordinator  [facility]  Cell [phone]

## 2016-12-27 ENCOUNTER — Encounter (HOSPITAL_COMMUNITY): Payer: Self-pay

## 2016-12-27 ENCOUNTER — Encounter (HOSPITAL_COMMUNITY): Admission: EM | Disposition: A | Discharge status: Skilled Nursing Facility | Payer: Self-pay | Source: Home / Self Care

## 2016-12-27 ENCOUNTER — Inpatient Hospital Stay (HOSPITAL_COMMUNITY): Payer: BLUE CROSS/BLUE SHIELD

## 2016-12-27 DIAGNOSIS — I38 Endocarditis, valve unspecified: Secondary | ICD-10-CM

## 2016-12-27 HISTORY — PX: TEE WITHOUT CARDIOVERSION: SHX5443

## 2016-12-27 LAB — PROTIME-INR
INR: 1.36
Prothrombin Time: 16.9 seconds — ABNORMAL HIGH (ref 11.4–15.2)

## 2016-12-27 LAB — CBC
HEMATOCRIT: 31 % — AB (ref 39.0–52.0)
HEMOGLOBIN: 10.1 g/dL — AB (ref 13.0–17.0)
MCH: 29.5 pg (ref 26.0–34.0)
MCHC: 32.6 g/dL (ref 30.0–36.0)
MCV: 90.6 fL (ref 78.0–100.0)
Platelets: 262 10*3/uL (ref 150–400)
RBC: 3.42 MIL/uL — ABNORMAL LOW (ref 4.22–5.81)
RDW: 17.1 % — AB (ref 11.5–15.5)
WBC: 6.7 10*3/uL (ref 4.0–10.5)

## 2016-12-27 LAB — AEROBIC/ANAEROBIC CULTURE W GRAM STAIN (SURGICAL/DEEP WOUND)

## 2016-12-27 LAB — AEROBIC/ANAEROBIC CULTURE (SURGICAL/DEEP WOUND)

## 2016-12-27 LAB — HCV COMMENT:

## 2016-12-27 LAB — HEPARIN LEVEL (UNFRACTIONATED)
HEPARIN UNFRACTIONATED: 1.01 [IU]/mL — AB (ref 0.30–0.70)
Heparin Unfractionated: 0.58 IU/mL (ref 0.30–0.70)

## 2016-12-27 LAB — HEPATITIS C ANTIBODY (REFLEX): HCV Ab: 0.2 s/co ratio (ref 0.0–0.9)

## 2016-12-27 SURGERY — ECHOCARDIOGRAM, TRANSESOPHAGEAL
Anesthesia: Moderate Sedation

## 2016-12-27 MED ORDER — FENTANYL CITRATE (PF) 100 MCG/2ML IJ SOLN
INTRAMUSCULAR | Status: AC
  Filled 2016-12-27: qty 2

## 2016-12-27 MED ORDER — WARFARIN SODIUM 5 MG PO TABS
10.0000 mg | ORAL_TABLET | Freq: Once | ORAL | Status: AC
  Administered 2016-12-27: 10 mg via ORAL
  Filled 2016-12-27: qty 1
  Filled 2016-12-27: qty 2

## 2016-12-27 MED ORDER — FENTANYL CITRATE (PF) 100 MCG/2ML IJ SOLN
INTRAMUSCULAR | Status: DC | PRN
  Administered 2016-12-27 (×3): 25 ug via INTRAVENOUS

## 2016-12-27 MED ORDER — MIDAZOLAM HCL 5 MG/ML IJ SOLN
INTRAMUSCULAR | Status: AC
  Filled 2016-12-27: qty 2

## 2016-12-27 MED ORDER — MIDAZOLAM HCL 10 MG/2ML IJ SOLN
INTRAMUSCULAR | Status: DC | PRN
  Administered 2016-12-27 (×2): 1 mg via INTRAVENOUS
  Administered 2016-12-27: 2 mg via INTRAVENOUS
  Administered 2016-12-27: 1 mg via INTRAVENOUS

## 2016-12-27 MED ORDER — BUTAMBEN-TETRACAINE-BENZOCAINE 2-2-14 % EX AERO
INHALATION_SPRAY | CUTANEOUS | Status: DC | PRN
  Administered 2016-12-27: 2 via TOPICAL

## 2016-12-27 MED ORDER — SODIUM CHLORIDE 0.9 % IV SOLN
1.0000 g | INTRAVENOUS | Status: DC
  Administered 2016-12-27 – 2016-12-31 (×5): 1 g via INTRAVENOUS
  Filled 2016-12-27 (×6): qty 1

## 2016-12-27 NOTE — Progress Notes (Signed)
Orthopedic Tech Progress Note Patient Details:  Caleb ArthursJohn J Dawson [birth year] [id number]  Pt refused CPM (stated he is going to a procedure) REFUSED   [name], [name] 12/27/2016, 1:06 PM
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
## 2016-12-27 NOTE — H&P (Signed)
   INTERVAL PROCEDURE H&P  History and Physical Interval Note:  12/27/2016 1:38 PM  Toni ArthursJohn J Mendiola has presented today for their planned procedure. The various methods of treatment have been discussed with the patient and family. After consideration of risks, benefits and other options for treatment, the patient has consented to the procedure.  The patients' outpatient history has been reviewed, patient examined, and no change in status from most recent office note within the past 30 days. I have reviewed the patients' chart and labs and will proceed as planned. Questions were answered to the patient's satisfaction.   Chrystie NoseKenneth C. Edison Wollschlager, MD, [facility] SystemFACC    [facility]  Attending Cardiologist  Direct Dial: [phone]  Fax: [phone]  Website:  [email or web address]  Zoya Sprecher C Lavoy Bernards 12/27/2016, 1:38 PM

## 2016-12-27 NOTE — Progress Notes (Signed)
[facility]  [facility] Coordinator will follow with ID team for possible IV ABX at DC and possible HH needs if pt does not DC to SNF.  If patient discharges after hours, please call [phone].   Sedalia Mutaamela S Chandler 12/27/2016, 5:57 PM

## 2016-12-27 NOTE — Progress Notes (Signed)
ANTICOAGULATION CONSULT NOTE - Follow Up Consult  Pharmacy Consult:  Heparin / Coumadin Indication: mechanical AVR  Allergies  Allergen Reactions  . No Known Allergies     Patient Measurements: Height: 6\' 1"  (185.4 cm) Weight: 179 lb 14.3 oz (81.6 kg) IBW/kg (Calculated) : 79.9  Vital Signs: Temp: 98.3 F (36.8 C) (08/10 0504) Temp Source: Oral (08/10 0504) BP: 125/85 (08/10 0504) Pulse Rate: 72 (08/10 0504)  Labs:  Recent Labs  12/25/16 0418  12/25/16 2132 12/26/16 0402 12/27/16 0520  HGB 9.7*  --   --  9.8* 10.1*  HCT 30.4*  --   --  30.4* 31.0*  PLT 291  --   --  261 262  LABPROT 16.4*  --   --  15.9* 16.9*  INR 1.31  --   --  1.26 1.36  HEPARINUNFRC <0.10*  < > 0.48 0.67 1.01*  CREATININE 0.84  --   --   --   --   < > = values in this interval not displayed.  Estimated Creatinine Clearance: 100.4 mL/min (by C-G formula based on SCr of 0.84 mg/dL).    Assessment: [age] YOM with history of mechanical AVR, s/p surgery on 12/24/16, to continue on IV heparin bridge to Coumadin.  Heparin level was supra-therapeutic this AM and rate was reduced.  Not yet able to check confirmatory level since patient is off the floor for TEE.  Patient's INR is sub-therapeutic but trending up.  No bleeding reported.   Goal of Therapy:  INR 2.5 - 3.5 Heparin level 0.3 - 0.7 units/mL Monitor platelets by anticoagulation protocol: Yes     Plan:  Continue heparin gtt at 1200 units/hr F/U confirmatory heparin level when patient returns from TEE Repeat Coumadin 10mg  PO today Daily PT / INR, heparin level, CBC Monitor for s/sx of bleeding   Dilan Fullenwider D. Laney Potashang, PharmD, BCPS Pager:  [phone] 12/27/2016, 1:33 PM

## 2016-12-27 NOTE — Progress Notes (Signed)
Central Tele called pt has 2 pause in heart beat and a missed beat with heart rate going down in the 40's. Pt is asymptomatic and is now NS with HR 62. Notified MD will cont to mont.

## 2016-12-27 NOTE — Clinical Social Work Note (Signed)
Clinical Social Worker continuing to follow patient and family for support and discharge planning needs.  Patient having several procedures completed today and awaiting determination about possible long term IV antibiotics.  CSW remains available for support and to assist with discharge planning needs.  Macario GoldsJesse Rajveer Handler, Kentucky[place]CSW [phone]

## 2016-12-27 NOTE — Progress Notes (Signed)
  Echocardiogram Echocardiogram Transesophageal has been performed.  Janalyn HarderWest, Alonie Gazzola R 12/27/2016, 3:03 PM

## 2016-12-27 NOTE — Progress Notes (Signed)
ANTICOAGULATION CONSULT NOTE - Follow Up Consult  Pharmacy Consult:  Heparin / Coumadin Indication: mechanical AVR  Allergies  Allergen Reactions  . Scallops [Shellfish Allergy]     Patient Measurements: Height: 6\' 1"  (185.4 cm) Weight: 179 lb 14.3 oz (81.6 kg) IBW/kg (Calculated) : 79.9  Vital Signs: Temp: 97.9 F (36.6 C) (08/10 1329) Temp Source: Oral (08/10 1329) BP: 107/72 (08/10 1500) Pulse Rate: 65 (08/10 1500)  Labs:  Recent Labs  12/25/16 0418  12/26/16 0402 12/27/16 0520 12/27/16 1557  HGB 9.7*  --  9.8* 10.1*  --   HCT 30.4*  --  30.4* 31.0*  --   PLT 291  --  261 262  --   LABPROT 16.4*  --  15.9* 16.9*  --   INR 1.31  --  1.26 1.36  --   HEPARINUNFRC <0.10*  < > 0.67 1.01* 0.58  CREATININE 0.84  --   --   --   --   < > = values in this interval not displayed.  Estimated Creatinine Clearance: 100.4 mL/min (by C-G formula based on SCr of 0.84 mg/dL).    Assessment: [age] YOM with history of mechanical AVR, s/p surgery on 12/24/16, to continue on IV heparin bridge to Coumadin.    Heparin level was supra-therapeutic this AM and rate was reduced.  Repeat level is therapeutic at 0.58 on new rate of 1200 units/hr.    Goal of Therapy:  INR 2.5 - 3.5 Heparin level 0.3 - 0.7 units/mL Monitor platelets by anticoagulation protocol: Yes     Plan:  Continue heparin gtt at 1200 units/hr Continue Coumadin as ordered.  Daily PT / INR, heparin level, CBC Monitor for s/sx of bleeding  Link SnufferJessica Aritza Brunet, PharmD, BCPS Clinical Pharmacist Clinical phone 12/27/2016 until 11PM [phone] After hours, please call [phone] 12/27/2016, 4:57 PM
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
## 2016-12-27 NOTE — CV Procedure (Addendum)
TRANSESOPHAGEAL ECHOCARDIOGRAM (TEE) NOTE  INDICATIONS: mechanical prosthetic valve endocarditis  PROCEDURE:   Informed consent was obtained prior to the procedure. The risks, benefits and alternatives for the procedure were discussed and the patient comprehended these risks.  Risks include, but are not limited to, cough, sore throat, vomiting, nausea, somnolence, esophageal and stomach trauma or perforation, bleeding, low blood pressure, aspiration, pneumonia, infection, trauma to the teeth and death.    After a procedural time-out, the patient was given 5 mg versed and 75 mcg fentanyl for moderate sedation.  The patient's heart rate, blood pressure, and oxygen saturation are monitored continuously during the procedure.The oropharynx was anesthetized 10 cc of topical 1% viscous lidocaine and 2 cetacaine sprays.  The transesophageal probe was inserted in the esophagus and stomach without difficulty and multiple views were obtained.  The patient was kept under observation until the patient left the procedure room.  The period of conscious sedation is 24 minutes, of which I was present face-to-face 100% of this time. The patient left the procedure room in stable condition.   Agitated microbubble saline contrast was not administered.  COMPLICATIONS:    There were no immediate complications.  Findings:  1. LEFT VENTRICLE: The left ventricular wall thickness is mildly increased.  The left ventricular cavity is normal in size. Wall motion is normal.  LVEF is 55-60%.  2. RIGHT VENTRICLE:  The right ventricle is normal in structure and function without any thrombus or masses.    3. LEFT ATRIUM:  The left atrium is normal in size without any thrombus or masses.  There is not spontaneous echo contrast ("smoke") in the left atrium consistent with a low flow state.  4. LEFT ATRIAL APPENDAGE:  The left atrial appendage is free of any thrombus or masses. The appendage has multiple lobes - there  appears to be a mid-appendage transverse septation. Pulse doppler indicates high flow in the appendage.  5. ATRIAL SEPTUM:  The atrial septum appears intact and is free of thrombus and/or masses.  There is no evidence for interatrial shunting by color doppler.  6. RIGHT ATRIUM:  The right atrium is normal in size and function without any thrombus or masses. Prominent eustachian valve.  7. MITRAL VALVE:  The mitral valve has some shagginess to the leaflet, but no obvious vegetation when viewed with 2D and 3D echo. There is trace to mild regurgitation.  There were no vegetations or stenosis.  8. AORTIC VALVE:  The aortic valve a mechanical bileaflet valve. There is significant acoustic dropout which made it difficult to evaluate the valve fully. There is normal bileaflet motion and trivial AI (typically normal). There were no obvious large vegetations, however, the valve could not be completely visualized.  9. TRICUSPID VALVE:  The tricuspid valve is normal in structure and function with trivail regurgitation.  There were no vegetations or stenosis  10.  PULMONIC VALVE:  The pulmonic valve is normal in structure and function with no regurgitation.  There were no vegetations or stenosis.   11. AORTIC ARCH, ASCENDING AND DESCENDING AORTA:  There was no Myrtis Ser(Katz et. Al, 1992) atherosclerosis of the ascending aorta, aortic arch, or proximal descending aorta. The sinotubular junction is dilated to 4.25 cm, but the ascending aorta measures 3.3 cm.  12. PULMONARY VEINS: Anomalous pulmonary venous return was not noted.  13. PERICARDIUM: The pericardium appeared normal and non-thickened.  There is no pericardial effusion.  IMPRESSION:   1. No LAA thrombus 2. Negative for PFO by color doppler  3. Mechanical tilting bileaflet aortic valve replacement with normal leaflet motion, trivial AI, no obvious large vegetation but incompletely visualized due to sewing ring dropout. No evidence for paravalvular  abscess. 4. Trace to mild MR 5. Dilated sinotubular junction to 4.25 cm, but ascending aorta measures 3.3 cm. 6. Prominent eustachian valve 7. LVEF 55-60% with normal wall motion  RECOMMENDATIONS:    1.  No obvious vegetation of the mechanical aortic valve with normal tilting bileaflet motion.  Time Spent Directly with the Patient:  45 minutes   Chrystie Nose, MD, [facility]  [place]  [facility]  Attending Cardiologist  Direct Dial: [phone]  Fax: [phone]  Website:  [email or web address] Nicely [email or web address] 12/27/2016, 2:47 PM

## 2016-12-27 NOTE — Progress Notes (Signed)
ANTICOAGULATION CONSULT NOTE - Follow Up Consult  Pharmacy Consult for heparin Indication: AVR  Labs:  Recent Labs  12/25/16 0418  12/25/16 2132 12/26/16 0402 12/27/16 0520  HGB 9.7*  --   --  9.8* 10.1*  HCT 30.4*  --   --  30.4* 31.0*  PLT 291  --   --  261 262  LABPROT 16.4*  --   --  15.9* 16.9*  INR 1.31  --   --  1.26 1.36  HEPARINUNFRC <0.10*  < > 0.48 0.67 1.01*  CREATININE 0.84  --   --   --   --   < > = values in this interval not displayed.   Assessment: 66yo male now above goal on heparin after three levels at goal though yesterday was at upper end of goal.  Goal of Therapy:  Heparin level 0.3-0.7 units/ml   Plan:  Will decrease heparin gtt by ~2-3 units/kg/hr to 1200 units/hr and check level in 6hr.  Vernard GamblesVeronda Jermall Isaacson, PharmD, BCPS  12/27/2016,7:25 AM

## 2016-12-28 LAB — CBC
HEMATOCRIT: 31.1 % — AB (ref 39.0–52.0)
Hemoglobin: 9.9 g/dL — ABNORMAL LOW (ref 13.0–17.0)
MCH: 28.6 pg (ref 26.0–34.0)
MCHC: 31.8 g/dL (ref 30.0–36.0)
MCV: 89.9 fL (ref 78.0–100.0)
Platelets: 251 10*3/uL (ref 150–400)
RBC: 3.46 MIL/uL — ABNORMAL LOW (ref 4.22–5.81)
RDW: 17 % — AB (ref 11.5–15.5)
WBC: 6.1 10*3/uL (ref 4.0–10.5)

## 2016-12-28 LAB — BASIC METABOLIC PANEL
Anion gap: 9 (ref 5–15)
BUN: 14 mg/dL (ref 6–20)
CHLORIDE: 99 mmol/L — AB (ref 101–111)
CO2: 26 mmol/L (ref 22–32)
CREATININE: 0.87 mg/dL (ref 0.61–1.24)
Calcium: 9.2 mg/dL (ref 8.9–10.3)
GFR calc Af Amer: >60 mL/min (ref >60)
GFR calc non Af Amer: >60 mL/min (ref >60)
GLUCOSE: 108 mg/dL — AB (ref 65–99)
POTASSIUM: 3.9 mmol/L (ref 3.5–5.1)
SODIUM: 134 mmol/L — AB (ref 135–145)

## 2016-12-28 LAB — PROTIME-INR
INR: 1.62
Prothrombin Time: 19.4 seconds — ABNORMAL HIGH (ref 11.4–15.2)

## 2016-12-28 LAB — HEPARIN LEVEL (UNFRACTIONATED): Heparin Unfractionated: 0.37 IU/mL (ref 0.30–0.70)

## 2016-12-28 MED ORDER — VANCOMYCIN HCL 10 G IV SOLR
1500.0000 mg | Freq: Two times a day (BID) | INTRAVENOUS | Status: DC
  Administered 2016-12-28 – 2016-12-29 (×4): 1500 mg via INTRAVENOUS
  Filled 2016-12-28 (×5): qty 1500

## 2016-12-28 MED ORDER — WARFARIN SODIUM 7.5 MG PO TABS
12.5000 mg | ORAL_TABLET | Freq: Once | ORAL | Status: AC
  Administered 2016-12-28: 17:00:00 12.5 mg via ORAL
  Filled 2016-12-28: qty 1

## 2016-12-28 MED ORDER — WARFARIN SODIUM 5 MG PO TABS: 10.0000 mg | ORAL_TABLET | Freq: Once | ORAL | Status: DC

## 2016-12-28 MED ORDER — TRAZODONE HCL 50 MG PO TABS
25.0000 mg | ORAL_TABLET | Freq: Every evening | ORAL | Status: DC | PRN
Start: 2016-12-28 — End: 2017-01-01
  Administered 2016-12-29 (×2): 25 mg via ORAL
  Filled 2016-12-28 (×3): qty 1

## 2016-12-28 NOTE — Plan of Care (Signed)
Problem: Skin Integrity: Goal: Risk for impaired skin integrity will decrease Outcome: Progressing Assessed pt skin and cleaned around pin sites. Explained to pt how to clean and dress the site around the pin. I also explained the importance of pin site care with the prevention of infection. Pt understood.
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
## 2016-12-28 NOTE — Progress Notes (Addendum)
ANTICOAGULATION CONSULT NOTE - Follow Up Consult  Pharmacy Consult:  Heparin / Coumadin Indication: mechanical AVR  Allergies  Allergen Reactions  . Scallops [Shellfish Allergy]     Patient Measurements: Height: 6\' 1"  (185.4 cm) Weight: 179 lb 14.3 oz (81.6 kg) IBW/kg (Calculated) : 79.9  Vital Signs: Temp: 97.8 F (36.6 C) (08/11 0500) Temp Source: Oral (08/11 0500) BP: 108/59 (08/11 0500) Pulse Rate: 65 (08/11 0500)  Labs:  Recent Labs  12/26/16 0402 12/27/16 0520 12/27/16 1557 12/28/16 0458  HGB 9.8* 10.1*  --  9.9*  HCT 30.4* 31.0*  --  31.1*  PLT 261 262  --  251  LABPROT 15.9* 16.9*  --  19.4*  INR 1.26 1.36  --  1.62  HEPARINUNFRC 0.67 1.01* 0.58 0.37  CREATININE  --   --   --  0.87    Estimated Creatinine Clearance: 96.9 mL/min (by C-G formula based on SCr of 0.87 mg/dL).    Assessment: [age] YOM with history of mechanical AVR, s/p surgery on 12/24/16, to continue on IV heparin bridge to Coumadin.  Heparin level is therapeutic and INR is trending up toward goal.  No bleeding reported.   Goal of Therapy:  INR 2.5 - 3.5 Heparin level 0.3 - 0.7 units/mL Monitor platelets by anticoagulation protocol: Yes     Plan:  Continue heparin gtt at 1200 units/hr Coumadin 12.5mg  PO today Daily PT / INR, heparin level, CBC Monitor for s/sx of bleeding   Carroll Ranney D. Laney Potashang, PharmD, BCPS Pager:  [phone] 12/28/2016, 8:42 AM   =====================================  Addendum: - add vanc for intra-abd infxn - SCr 0.87 (stable), CrCL 97 ml/min - vanc trough previously ~16 mcg/mL on 1gm q8h - Zosyn changed to Invanz - TEE no obvious vegetation  Zosyn 8/2 >> 8/10 Vanc 8/2 >> 8/8, resume 8/11 >> Invanz 8/10 >>  8/3 VT = 16 on 1g q8h, not steady state level  7/20 TA - normal flora 7/12 MRSA - NEG 8/2 abd wound - Propionibacterium spp + Morganella morganii (S Zosyn, cefepime, ctx) 8/4 abscess - Diphtheroids + Finegoldia magna 8/9 BCx - NGTD   Plan: - vanc  1500mg  IV Q12H for goal trough ~15 mcg/mL - monitor renal fxn, clinical progress, vanc trough at Css   Dannia Snook D. Laney Potashang, PharmD, BCPS Pager:  [phone] 12/28/2016, 10:05 AM

## 2016-12-29 ENCOUNTER — Encounter (HOSPITAL_COMMUNITY): Payer: Self-pay | Admitting: Internal Medicine

## 2016-12-29 DIAGNOSIS — R001 Bradycardia, unspecified: Secondary | ICD-10-CM

## 2016-12-29 LAB — CBC
HCT: 29.9 % — ABNORMAL LOW (ref 39.0–52.0)
HEMOGLOBIN: 9.9 g/dL — AB (ref 13.0–17.0)
MCH: 29.5 pg (ref 26.0–34.0)
MCHC: 33.1 g/dL (ref 30.0–36.0)
MCV: 89 fL (ref 78.0–100.0)
Platelets: 231 10*3/uL (ref 150–400)
RBC: 3.36 MIL/uL — ABNORMAL LOW (ref 4.22–5.81)
RDW: 16.7 % — ABNORMAL HIGH (ref 11.5–15.5)
WBC: 5.7 10*3/uL (ref 4.0–10.5)

## 2016-12-29 LAB — PROTIME-INR
INR: 1.94
Prothrombin Time: 22.5 seconds — ABNORMAL HIGH (ref 11.4–15.2)

## 2016-12-29 LAB — HEPARIN LEVEL (UNFRACTIONATED): Heparin Unfractionated: 0.52 IU/mL (ref 0.30–0.70)

## 2016-12-29 MED ORDER — METHOCARBAMOL 500 MG PO TABS
1000.0000 mg | ORAL_TABLET | Freq: Three times a day (TID) | ORAL | Status: DC | PRN
  Administered 2017-01-01: 1000 mg via ORAL
  Filled 2016-12-29: qty 2

## 2016-12-29 MED ORDER — WARFARIN SODIUM 7.5 MG PO TABS
12.5000 mg | ORAL_TABLET | Freq: Once | ORAL | Status: AC
  Administered 2016-12-29: 20:00:00 12.5 mg via ORAL
  Filled 2016-12-29: qty 1
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
## 2016-12-29 NOTE — Progress Notes (Signed)
ANTICOAGULATION CONSULT NOTE - Follow Up Consult  Pharmacy Consult:  Heparin / Coumadin Indication: mechanical AVR  Allergies  Allergen Reactions  . Scallops [Shellfish Allergy]     Patient Measurements: Height: 6\' 1"  (185.4 cm) Weight: 179 lb 14.3 oz (81.6 kg) IBW/kg (Calculated) : 79.9  Vital Signs: Temp: 97.7 F (36.5 C) (08/12 0500) Temp Source: Oral (08/12 0500) BP: 126/85 (08/12 0500) Pulse Rate: 69 (08/12 0500)  Labs:  Recent Labs  12/27/16 0520 12/27/16 1557 12/28/16 0458 12/29/16 0437  HGB 10.1*  --  9.9* 9.9*  HCT 31.0*  --  31.1* 29.9*  PLT 262  --  251 231  LABPROT 16.9*  --  19.4* 22.5*  INR 1.36  --  1.62 1.94  HEPARINUNFRC 1.01* 0.58 0.37 0.52  CREATININE  --   --  0.87  --     Estimated Creatinine Clearance: 96.9 mL/min (by C-G formula based on SCr of 0.87 mg/dL).    Assessment: Caleb Dawson with history of mechanical AVR, s/p surgery on 12/24/16, to continue on IV heparin bridge to Coumadin.  Heparin level is therapeutic and INR is trending up.  Expect INR to be therapeutic in the next couple of days.  No bleeding reported.   Goal of Therapy:  INR 2.5 - 3.5 Heparin level 0.3 - 0.7 units/mL Monitor platelets by anticoagulation protocol: Yes     Plan:  Continue heparin gtt at 1200 units/hr.  Could switch to Lovenox 80mg  SQ Q12H if discharging. Repeat Coumadin 12.5mg  PO today Daily PT / INR, heparin level, CBC Monitor for s/sx of bleeding   Dresean Beckel D. Laney Potashang, PharmD, BCPS Pager:  [phone] 12/29/2016, 9:28 AM
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
## 2016-12-30 LAB — CBC
HEMATOCRIT: 31.6 % — AB (ref 39.0–52.0)
Hemoglobin: 10.2 g/dL — ABNORMAL LOW (ref 13.0–17.0)
MCH: 28.9 pg (ref 26.0–34.0)
MCHC: 32.3 g/dL (ref 30.0–36.0)
MCV: 89.5 fL (ref 78.0–100.0)
Platelets: 235 10*3/uL (ref 150–400)
RBC: 3.53 MIL/uL — ABNORMAL LOW (ref 4.22–5.81)
RDW: 17.1 % — AB (ref 11.5–15.5)
WBC: 4.7 10*3/uL (ref 4.0–10.5)

## 2016-12-30 LAB — HEPARIN LEVEL (UNFRACTIONATED): Heparin Unfractionated: 0.49 IU/mL (ref 0.30–0.70)

## 2016-12-30 LAB — PROTIME-INR
INR: 2.17
Prothrombin Time: 24.5 seconds — ABNORMAL HIGH (ref 11.4–15.2)

## 2016-12-30 LAB — VANCOMYCIN, TROUGH: VANCOMYCIN TR: 30 ug/mL — AB (ref 15–20)

## 2016-12-30 MED ORDER — WARFARIN SODIUM 7.5 MG PO TABS
12.5000 mg | ORAL_TABLET | Freq: Once | ORAL | Status: AC
  Administered 2016-12-30: 18:00:00 12.5 mg via ORAL
  Filled 2016-12-30: qty 1

## 2016-12-30 MED ORDER — VANCOMYCIN HCL IN DEXTROSE 1-5 GM/200ML-% IV SOLN
1000.0000 mg | Freq: Two times a day (BID) | INTRAVENOUS | Status: DC
  Administered 2016-12-31 – 2017-01-01 (×3): 1000 mg via INTRAVENOUS
  Filled 2016-12-30 (×5): qty 200

## 2016-12-30 NOTE — Progress Notes (Signed)
CSW following for SNF placement- patient top choices unable to make bed offer- alternative offers provided to patient and patient girlfriend who is helping to make decision  Pt girlfriend, [name], is visiting facilities today and plans to provide CSW with choice asap  Burna SisJenna H. Christifer Chapdelaine, LCSW Clinical Social Worker [phone]
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
## 2016-12-30 NOTE — Progress Notes (Signed)
CRITICAL VALUE ALERT  Critical Value:  Vancomycin trough of 30  Date & Time Notied:  12/30/16 at 10:49  Provider Notified: Charlyne PetrinKeith Paul-PA  Orders Received/Actions taken: Pharmacy discontinued dose for today

## 2016-12-30 NOTE — Progress Notes (Signed)
OT Note - addendum    12/30/16 1652  OT Visit Information  Last OT Received On 12/30/16  OT Time Calculation  OT Start Time (ACUTE ONLY) 1536  OT Stop Time (ACUTE ONLY) 1556  OT Time Calculation (min) 20 min  OT General Charges  $OT Visit 1 Procedure  OT Treatments  $Therapeutic Exercise 8-22 mins

## 2016-12-30 NOTE — Progress Notes (Signed)
PHARMACY CONSULT NOTE FOR:  OUTPATIENT  PARENTERAL ANTIBIOTIC THERAPY (OPAT)  Indication: Polymicrobial hardware and fractured pelvis infection Regimen: Invanz 1g IV Q24h and vancomycin 1g IV Q12h End date: 01/31/17  IV antibiotic discharge orders are pended. To discharging provider:  please sign these orders via discharge navigator,  Select New Orders & click on the button choice - Manage This Unsigned Work.    Thank you for allowing pharmacy to be a part of this patient's care.  Enzo BiNathan Joahan Swatzell, PharmD, BCPS Clinical Pharmacist Pager [phone] 12/30/2016 11:08 AM

## 2016-12-30 NOTE — Discharge Instructions (Signed)
Orthopaedic Trauma Service discharge instructions (pelvic ring fracture)  WBAT Left leg for transfers only - must have knee brace on locked in extension when mobilizing due to multiligamentous knee injury   NWB R leg   Unrestricted ROM B knees  SLR, quad sets B LEx 3-4 x a day 20-30 reps x 3-4 sets
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
## 2016-12-30 NOTE — Progress Notes (Signed)
ANTICOAGULATION CONSULT NOTE - Follow Up Consult  Pharmacy Consult:  Heparin / Coumadin Indication: mechanical AVR  Allergies  Allergen Reactions  . Scallops [Shellfish Allergy]     Patient Measurements: Height: 6\' 1"  (185.4 cm) Weight: 179 lb 14.3 oz (81.6 kg) IBW/kg (Calculated) : 79.9  Assessment: 65 YOM with history of mechanical AVR. Heparin/Coumadin for hx mech AVR. Coumadin PTA dose remains unclear but takes 7.5-15mg  daily. HL remains therapeutic at 0.49. INR trending up to 2.17 today (goal 2.5-3.5) Hgb low but stable at 10.2, plts wnl.  Goal of Therapy:  INR 2.5 - 3.5 Heparin level 0.3 - 0.7 units/mL Monitor platelets by anticoagulation protocol: Yes   Plan:  Continue heparin gtt at 1200 units/hr. Could switch to Lovenox 80mg  SQ Q12H if discharging. Continue Coumadin 12.5mg  PO today Monitor daily heparin level / INR, CBC, s/s of bleed  Caleb Dawson, PharmD, [facility] LP[facility] Clinical Pharmacist Pager [phone] 12/30/2016 8:18 AM
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
## 2016-12-31 LAB — CBC
HCT: 31.3 % — ABNORMAL LOW (ref 39.0–52.0)
Hemoglobin: 10.2 g/dL — ABNORMAL LOW (ref 13.0–17.0)
MCH: 29.8 pg (ref 26.0–34.0)
MCHC: 32.6 g/dL (ref 30.0–36.0)
MCV: 91.5 fL (ref 78.0–100.0)
PLATELETS: 235 10*3/uL (ref 150–400)
RBC: 3.42 MIL/uL — ABNORMAL LOW (ref 4.22–5.81)
RDW: 17.3 % — AB (ref 11.5–15.5)
WBC: 4.3 10*3/uL (ref 4.0–10.5)

## 2016-12-31 LAB — CULTURE, BLOOD (ROUTINE X 2)
CULTURE: NO GROWTH
Culture: NO GROWTH
SPECIAL REQUESTS: ADEQUATE
SPECIAL REQUESTS: ADEQUATE

## 2016-12-31 LAB — CREATININE, SERUM
CREATININE: 0.85 mg/dL (ref 0.61–1.24)
GFR calc Af Amer: >60 mL/min (ref >60)

## 2016-12-31 LAB — PROTIME-INR
INR: 2.83
PROTHROMBIN TIME: 30.3 s — AB (ref 11.4–15.2)

## 2016-12-31 LAB — HEPARIN LEVEL (UNFRACTIONATED)

## 2016-12-31 MED ORDER — WARFARIN SODIUM 5 MG PO TABS
10.0000 mg | ORAL_TABLET | Freq: Once | ORAL | Status: AC
  Administered 2016-12-31: 10 mg via ORAL
  Filled 2016-12-31: qty 2
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
## 2016-12-31 NOTE — Progress Notes (Signed)
ANTICOAGULATION CONSULT NOTE - Follow Up Consult  Pharmacy Consult:  Heparin / Coumadin Indication: mechanical AVR  Allergies  Allergen Reactions  . Scallops [Shellfish Allergy]     Patient Measurements: Height: 6\' 1"  (185.4 cm) Weight: 179 lb 14.3 oz (81.6 kg) IBW/kg (Calculated) : 79.9  Assessment: 65 YOM with history of mechanical AVR continuing on Coumadin - PTA dose remains unclear but takes 7.5-15mg  daily. Heparin d/c'd this AM by Cardiology with INR trending up 2.17>2.83 (goal 2.5-3.5). CBC stable. No bleed documented.  Goal of Therapy:  INR 2.5 - 3.5 Monitor platelets by anticoagulation protocol: Yes   Plan:  Coumadin 10mg  PO x 1 Daily INR Monitor CBC, s/s of bleed   Caleb Dawson, PharmD, BCPS Clinical Pharmacist Rx Phone # for today: [phone] After 3:30PM, please call Main Rx: [phone] 12/31/2016 9:08 AM

## 2016-12-31 NOTE — Progress Notes (Addendum)
PT note: Spoke with patient regarding d/c plan.  At this time we remain to recommend post acute rehab before return home.  [facility].  Pt largest limiting factor to return home remains stairs to enter his g/f home.  Will continue to follow and update recommendations as able.    Wasyl Dornfeld, SPTA [phone]
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
## 2016-12-31 NOTE — Clinical Social Work Note (Signed)
Clinical Social Worker continuing to follow patient and family for support and discharge planning needs.  [facility]  CSW has sent clinical information to facility for insurance authorization.  Facility to [employer] insurance, however if no approval by tomorrow afternoon, will likely be able to accept 5 day LOG.  Patient and significant other prepared for discharge via PTAR tomorrow.  CSW remains available for support and to facilitate patient discharge needs.  Macario GoldsJesse Ural [place], [place] [phone]
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
## 2017-01-01 DIAGNOSIS — I499 Cardiac arrhythmia, unspecified: Secondary | ICD-10-CM

## 2017-01-01 DIAGNOSIS — R55 Syncope and collapse: Secondary | ICD-10-CM

## 2017-01-01 LAB — PROTIME-INR
INR: 3.47
PROTHROMBIN TIME: 35.7 s — AB (ref 11.4–15.2)

## 2017-01-01 MED ORDER — ACETAMINOPHEN 325 MG PO TABS: 650.0000 mg | ORAL_TABLET | Freq: Four times a day (QID) | ORAL | Status: DC

## 2017-01-01 MED ORDER — ERTAPENEM IV (FOR PTA / DISCHARGE USE ONLY): 1.0000 g | INTRAVENOUS | 0 refills | Status: DC

## 2017-01-01 MED ORDER — WARFARIN SODIUM 2.5 MG PO TABS: 2.5000 mg | ORAL_TABLET | Freq: Once | ORAL | Status: DC

## 2017-01-01 MED ORDER — TRAMADOL HCL 50 MG PO TABS: 100.0000 mg | ORAL_TABLET | Freq: Four times a day (QID) | ORAL | 0 refills | Status: DC | PRN

## 2017-01-01 MED ORDER — HEPARIN SOD (PORK) LOCK FLUSH 100 UNIT/ML IV SOLN
250.0000 [IU] | INTRAVENOUS | Status: AC | PRN
  Administered 2017-01-01: 250 [IU]

## 2017-01-01 MED ORDER — DOCUSATE SODIUM 100 MG PO CAPS: 100.0000 mg | ORAL_CAPSULE | Freq: Two times a day (BID) | ORAL | 0 refills | Status: DC

## 2017-01-01 MED ORDER — PANTOPRAZOLE SODIUM 40 MG PO TBEC: 40.0000 mg | DELAYED_RELEASE_TABLET | Freq: Two times a day (BID) | ORAL | Status: DC

## 2017-01-01 MED ORDER — FERROUS GLUCONATE 324 (38 FE) MG PO TABS: 324.0000 mg | ORAL_TABLET | Freq: Two times a day (BID) | ORAL | 3 refills | Status: DC

## 2017-01-01 MED ORDER — WARFARIN SODIUM 5 MG PO TABS: 2.5000 mg | ORAL_TABLET | Freq: Once | ORAL | Status: DC

## 2017-01-01 MED ORDER — VANCOMYCIN IV (FOR PTA / DISCHARGE USE ONLY): 1000.0000 mg | Freq: Two times a day (BID) | INTRAVENOUS | 0 refills | Status: DC

## 2017-01-01 MED ORDER — ADULT MULTIVITAMIN W/MINERALS CH: 1.0000 | ORAL_TABLET | Freq: Every day | ORAL | Status: AC

## 2017-01-01 MED ORDER — CHLORHEXIDINE GLUCONATE 0.12% ORAL RINSE (MEDLINE KIT): 15.0000 mL | Freq: Two times a day (BID) | OROMUCOSAL | 0 refills | Status: DC
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
## 2017-01-01 NOTE — Clinical Social Work Placement (Signed)
   CLINICAL SOCIAL WORK PLACEMENT  NOTE  Date:  01/01/2017  Patient Details  Name: Caleb Dawson MRN: [id number] Date of Birth: 10-28-1950  Clinical Social Work is seeking post-discharge placement for this patient at the Skilled  Nursing Facility level of care (*CSW will initial, date and re-position this form in  chart as items are completed):  Yes   Patient/family provided with [facility] Clinical Social Work Department's list of facilities offering this level of care within the geographic area requested by the patient (or if unable, by the patient's family).  Yes   Patient/family informed of their freedom to choose among providers that offer the needed level of care, that participate in Medicare, Medicaid or managed care program needed by the patient, have an available bed and are willing to accept the patient.  Yes   Patient/family informed of [facility] ownership interest [facility] and [facility], as well as of the fact that they are under no obligation to receive care at these facilities.  PASRR submitted to EDS on       PASRR number received on       Existing PASRR number confirmed on       FL2 transmitted to all facilities in geographic area requested by pt/family on       FL2 transmitted to all facilities within larger geographic area on       Patient informed that his/her managed care company has contracts with or will negotiate with certain facilities, including the following:        Yes   Patient/family informed of bed offers received.  [facility]     Physician recommends and patient chooses bed at      [facility].  Patient to be transferred to facility by [facility]     Patient family notified on 01/01/17 of transfer.  Name of family member notified:  patient responsible for self     PHYSICIAN Please prepare priority  discharge summary, including medications, Please sign FL2, Please prepare prescriptions     Additional Comment:    _______________________________________________ Tresa MoorePatricia V Raequan Vanschaick, LCSW 01/01/2017, 2:26 PM

## 2017-01-01 NOTE — Progress Notes (Signed)
ANTICOAGULATION CONSULT NOTE - Follow Up Consult  Pharmacy Consult:  Heparin / Coumadin Indication: mechanical AVR  Allergies  Allergen Reactions  . Scallops [Shellfish Allergy]     Patient Measurements: Height: 6\' 1"  (185.4 cm) Weight: 179 lb 14.3 oz (81.6 kg) IBW/kg (Calculated) : 79.9  Assessment: 65 YOM with history of mechanical AVR continuing on Coumadin - PTA dose remains unclear but takes 7.5-15mg  daily. Heparin d/c'd 8/14 with therapeutic INR. Cardiology continues to trend up to 3.47 with large jumps over last 2 days (goal 2.5-3.5). CBC stable. No bleed documented.  Goal of Therapy:  INR 2.5 - 3.5 Monitor platelets by anticoagulation protocol: Yes   Plan:  Coumadin 2.5mg  PO x 1 - lower dose tonight with large INR jumps -May require some combination of 7.5mg /5mg  doses on discharge Daily INR Monitor CBC, s/s of bleed   Babs BertinHaley Daris Aristizabal, PharmD, BCPS Clinical Pharmacist Rx Phone # for today: [phone] After 3:30PM, please call Main Rx: [id number] 01/01/2017 8:56 AM
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
## 2017-01-01 NOTE — Social Work (Signed)
Clinical Social Worker facilitated patient discharge including contacting patient family and facility to confirm patient discharge plans.  Clinical information faxed to facility and family agreeable with plan.    CSW arranged ambulance transport via [facility] to [facility].    RN to call [phone] to give report prior to discharge.  Clinical Social Worker will sign off for now as social work intervention is no longer needed. Please consult us again if new need arises.  Mindi Akerson, LCSW Clinical Social Worker [phone]    

## 2017-01-01 NOTE — Progress Notes (Signed)
Pt discharge education and instructions completed. [facility] and report called off to nurse Donald Porehris Freeman. Pt midline removed by IV team and SL PICC to left arm capped by IV team per protocol. Telemetry removed; pt abd incision remains intact opened to air with no drainage or active bleeding noted. Pt picked up by [facility] to be transported off to disposition. Pt transported off unit via stretcher with belongings to the side. Dionne BucyP. Amo Rondell Pardon RN

## 2017-01-01 NOTE — Progress Notes (Signed)
PT Cancellation Note  Patient Details Name: Caleb ArthursJohn J Claxton MRN: [id number] DOB: 06-20-1950   Cancelled Treatment:    Reason Eval/Treat Not Completed: (P) Other (comment) (Pt to d/c to SNF today.  Will defer tx to next level of care at this time.  )   Versie Fleener Artis DelayJ Martinique Pizzimenti 01/01/2017, 1:54 PM  Joycelyn RuaAimee Hines Kloss, PTA pager [phone]
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
## 2017-01-09 ENCOUNTER — Encounter (INDEPENDENT_AMBULATORY_CARE_PROVIDER_SITE_OTHER): Payer: Self-pay | Admitting: Orthopedic Surgery

## 2017-01-09 ENCOUNTER — Ambulatory Visit (INDEPENDENT_AMBULATORY_CARE_PROVIDER_SITE_OTHER): Payer: BLUE CROSS/BLUE SHIELD | Admitting: Orthopedic Surgery

## 2017-01-09 DIAGNOSIS — S32810D Multiple fractures of pelvis with stable disruption of pelvic ring, subsequent encounter for fracture with routine healing: Secondary | ICD-10-CM

## 2017-01-09 DIAGNOSIS — S63054D Dislocation of other carpometacarpal joint of right hand, subsequent encounter: Secondary | ICD-10-CM
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
## 2017-01-15 ENCOUNTER — Telehealth: Payer: Self-pay | Admitting: *Deleted
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
## 2017-01-16 NOTE — Telephone Encounter (Signed)
Can home health test him for C diff?

## 2017-01-17 ENCOUNTER — Institutional Professional Consult (permissible substitution): Payer: BLUE CROSS/BLUE SHIELD | Admitting: Internal Medicine

## 2017-01-17 ENCOUNTER — Encounter: Payer: Self-pay | Admitting: Infectious Disease

## 2017-01-17 NOTE — Telephone Encounter (Signed)
Verbal order for [facility] to collect stool and run C Diff panel per Dr Judy PimpleVan Dam. [facility] will fax lab results to [facility], but will page Dr Daiva EvesVan Dam if positive for c diff. [facility].

## 2017-01-17 NOTE — Telephone Encounter (Addendum)
Was able to speak with patient this morning. His medications/PICC care are through [facility]. Per Debby BudAndre, Manufacturing systems engineerharmacy Manager, cdiff testing will be coordinated.   If test is positive, please advise on desired treatment or next step.  [facility] will need MD order.

## 2017-01-21 ENCOUNTER — Ambulatory Visit: Payer: BLUE CROSS/BLUE SHIELD | Admitting: Physician Assistant

## 2017-01-23 ENCOUNTER — Encounter (INDEPENDENT_AMBULATORY_CARE_PROVIDER_SITE_OTHER): Payer: Self-pay | Admitting: Orthopedic Surgery

## 2017-01-23 ENCOUNTER — Ambulatory Visit (INDEPENDENT_AMBULATORY_CARE_PROVIDER_SITE_OTHER): Payer: BLUE CROSS/BLUE SHIELD | Admitting: Orthopedic Surgery

## 2017-01-23 DIAGNOSIS — S83512D Sprain of anterior cruciate ligament of left knee, subsequent encounter: Secondary | ICD-10-CM | POA: Diagnosis not present

## 2017-01-23 DIAGNOSIS — S83511D Sprain of anterior cruciate ligament of right knee, subsequent encounter: Secondary | ICD-10-CM | POA: Diagnosis not present
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
## 2017-01-27 ENCOUNTER — Encounter (INDEPENDENT_AMBULATORY_CARE_PROVIDER_SITE_OTHER): Payer: Self-pay | Admitting: Orthopedic Surgery

## 2017-01-27 ENCOUNTER — Other Ambulatory Visit (INDEPENDENT_AMBULATORY_CARE_PROVIDER_SITE_OTHER): Payer: Self-pay | Admitting: Orthopedic Surgery

## 2017-01-27 DIAGNOSIS — M25362 Other instability, left knee: Secondary | ICD-10-CM

## 2017-01-29 ENCOUNTER — Other Ambulatory Visit: Payer: Self-pay | Admitting: Pharmacist

## 2017-01-29 ENCOUNTER — Telehealth (INDEPENDENT_AMBULATORY_CARE_PROVIDER_SITE_OTHER): Payer: Self-pay | Admitting: Radiology

## 2017-01-29 ENCOUNTER — Ambulatory Visit (INDEPENDENT_AMBULATORY_CARE_PROVIDER_SITE_OTHER): Payer: BLUE CROSS/BLUE SHIELD | Admitting: Infectious Disease

## 2017-01-29 ENCOUNTER — Encounter: Payer: Self-pay | Admitting: Infectious Disease

## 2017-01-29 VITALS — BP 118/74 | HR 68 | Temp 98.7°F

## 2017-01-29 DIAGNOSIS — Z952 Presence of prosthetic heart valve: Secondary | ICD-10-CM

## 2017-01-29 DIAGNOSIS — M86151 Other acute osteomyelitis, right femur: Secondary | ICD-10-CM | POA: Diagnosis not present

## 2017-01-29 DIAGNOSIS — A499 Bacterial infection, unspecified: Secondary | ICD-10-CM | POA: Diagnosis not present

## 2017-01-29 DIAGNOSIS — T847XXD Infection and inflammatory reaction due to other internal orthopedic prosthetic devices, implants and grafts, subsequent encounter: Secondary | ICD-10-CM | POA: Diagnosis not present

## 2017-01-29 DIAGNOSIS — S329XXD Fracture of unspecified parts of lumbosacral spine and pelvis, subsequent encounter for fracture with routine healing: Secondary | ICD-10-CM | POA: Diagnosis not present

## 2017-01-29 MED ORDER — METRONIDAZOLE 500 MG PO TABS: 500.0000 mg | ORAL_TABLET | Freq: Three times a day (TID) | ORAL | 11 refills | Status: DC

## 2017-01-29 MED ORDER — LEVOFLOXACIN 500 MG PO TABS: 500.0000 mg | ORAL_TABLET | Freq: Every day | ORAL | 11 refills | Status: DC

## 2017-01-29 MED ORDER — MOXIFLOXACIN HCL 400 MG PO TABS: 400.0000 mg | ORAL_TABLET | Freq: Every day | ORAL | 5 refills | Status: DC

## 2017-01-29 NOTE — Telephone Encounter (Signed)
IC s/w patient and advised  

## 2017-01-29 NOTE — Telephone Encounter (Signed)
I talked to handy - np with oral abx pls clal thx

## 2017-01-29 NOTE — Telephone Encounter (Signed)
Please advise. Thanks.  

## 2017-01-29 NOTE — Telephone Encounter (Signed)
Patient is calling, states that he saw his ID dr today and they stopped the IV antibiotics and put him on oral antibiotics amd will be on them for 2 months.  He is schedule for surgery with Dr. August Saucerean on 03/04/17 and they are wanting to know if this will effect him having surgery that day. Per Dr. August Saucerean prior, he wanted him to be off the antibiotics for 1-2 weeks before the surgery. Patient states that him having the surgery is more important than being on the antibiotics.  Please call them back @ [phone] to advise.

## 2017-01-29 NOTE — Progress Notes (Signed)
Subjective:   Pain in hip with prolonged standing that he has been told not to do by Dr. Marcelino Scot   Patient ID: Caleb Dawson, male    DOB: 1951/03/12, 66 y.o.   MRN: [id number]  HPI  66 y.o. 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  I have been treating him with IV invanz and vancomycin to cover for ALL of these organisms.  He has done well for the most part. He did see Dr. Marcelino Scot who was concerned he said due to loosening of hardware attributed to the pt bearing weight too much on this side.  His ESR is actually down to 16 on most recent check with home labs.  He is scheduled to have surgery on both knees by Dr. Marlou Sa.  We have concern about persistence of polymicrobial infection involving the hardware. I would like him to finish his IV abx and switch to po levaquin nd flagyl which will cover the organisms isolated though not the diptheroid esp (I dont think this organisms is likely to have been esp pathogenic vs the GNR and anerobes)  Past Medical History:  Diagnosis Date  . Bicuspid aortic valve    s/p Bentall procedure 2013  . Hypothyroid     Past Surgical History:  Procedure Laterality Date  . AORTIC VALVE REPLACEMENT  2013   Bentall procedure w/ mechanical Aortic valve, post-op afib, post-op CVA req feeding tube, post-op tamponade  . APPLICATION OF WOUND VAC Left 11/28/2016   Procedure: APPLICATION OF WOUND VAC;  Surgeon: Altamese Missoula, MD;  Location: [place];  Service: Orthopedics;  Laterality: Left;  . CLOSED REDUCTION FINGER WITH PERCUTANEOUS PINNING Right 12/07/2016   Procedure: CLOSED REDUCTION FINGER WITH PERCUTANEOUS PINNING;  Surgeon: Leanora Cover, MD;  Location: [place];  Service: Orthopedics;  Laterality: Right;  . EXTERNAL FIXATION PELVIS N/A 11/28/2016   Procedure: EXTERNAL FIXATION PELVIS;  Surgeon: Altamese [place]. [place], MD;   Location: [place];  Service: Orthopedics;  Laterality: N/A;  . EXTERNAL FIXATION REMOVAL N/A 12/10/2016   Procedure: REMOVAL EXTERNAL FIXATION PELVIS;  Surgeon: Altamese McFarland, MD;  Location: [place];  Service: Orthopedics;  Laterality: N/A;  . I&D EXTREMITY Bilateral 11/28/2016   Procedure: IRRIGATION AND DEBRIDEMENT EXTREMITY LOWER ANTERIOR LEGs;  Surgeon: Altamese [place]aska, MD;  Location: [place];  Service: Orthopedics;  Laterality: Bilateral;  . I&D EXTREMITY N/A 12/19/2016   Procedure: IRRIGATION AND DEBRIDEMENT PELVIS;  Surgeon: Altamese Ir[place]ale, MD;  Location: [place];  Service: Orthopedics;  Laterality: N/A;  . I&D EXTREMITY N/A 12/21/2016   Procedure: IRRIGATION AND DEBRIDEMENT PELVIS WITH VAC CHANGE;  Surgeon: Altamese [place]burg, MD;  Location: [place];  Service: Orthopedics;  Laterality: N/A;  . INCISION AND DRAINAGE OF WOUND N/A 12/24/2016   Procedure: IRRIGATION AND DEBRIDEMENT WOUND WITH LAYERED CLOSURE;  Surgeon: Altamese Palmer, MD;  Location: [place];  Service: Orthopedics;  Laterality: N/A;  . IR HYBRID TRAUMA EMBOLIZATION  11/28/2016  . ORIF PELVIC FRACTURE N/A 12/10/2016   Procedure: OPEN REDUCTION INTERNAL FIXATION (ORIF) PELVIC FRACTURE;  Surgeon: Altamese Valhalla, MD;  Location: [place];  Service: Orthopedics;  Laterality: N/A;  . PELVIC ANGIOGRAPHY N/A 11/28/2016   Procedure: PELVIC ANGIOGRAPHY CPT 09323;  Surgeon: Corrie Mckusick, DO;  Location: [place];  Service: Anesthesiology;  Laterality: N/A;  . SACROILIAC JOINT FUSION Bilateral 11/28/2016   Procedure: SACROILIAC JOINT FUSION;  Surgeon: Altamese Bee, MD;  Location: [place];  Service: Orthopedics;  Laterality: Bilateral;  . TEE WITHOUT CARDIOVERSION N/A 12/27/2016   Procedure: TRANSESOPHAGEAL ECHOCARDIOGRAM (TEE);  Surgeon: Pixie Casino, MD;  Location: [facility] ENDOSCOPY;  Service: Cardiovascular;  Laterality: N/A;  . ULTRASOUND GUIDANCE FOR VASCULAR ACCESS Left 11/28/2016   Procedure: ULTRASOUND GUIDANCE FOR VASCULAR ACCESS, left iliac artery CPT 16967;   Surgeon: Corrie Mckusick, DO;  Location: [place];  Service: Anesthesiology;  Laterality: Left;    Family History  Problem Relation Age of Onset  . Hypertension Mother        born in [birth year]  . Anemia Mother   . Sudden Cardiac Death Father 61      Social History   Social History  . Marital status: Single    Spouse name: N/A  . Number of children: N/A  . Years of education: N/A   Occupational History  . Climatologist, geologist    Social History Main Topics  . Smoking status: Never Smoker  . Smokeless tobacco: Never Used  . Alcohol use No  . Drug use: No  . Sexual activity: Not Asked   Other Topics Concern  . None   Social History Narrative   Pt lives with his wife.    Allergies  Allergen Reactions  . Scallops [Shellfish Allergy]      Current Outpatient Prescriptions:  .  acetaminophen (TYLENOL) 325 MG tablet, Take 2 tablets (650 mg total) by mouth every 6 (six) hours., Disp: , Rfl:  .  chlorhexidine gluconate, MEDLINE KIT, (PERIDEX) 0.12 % solution, 15 mLs by Mouth Rinse route 2 (two) times daily., Disp: 120 mL, Rfl: 0 .  docusate sodium (COLACE) 100 MG capsule, Take 1 capsule (100 mg total) by mouth 2 (two) times daily., Disp: 10 capsule, Rfl: 0 .  ferrous gluconate (FERGON) 324 MG tablet, Take 1 tablet (324 mg total) by mouth 2 (two) times daily with a meal., Disp: , Rfl: 3 .  levothyroxine (SYNTHROID, LEVOTHROID) 50 MCG tablet, Take 50 mcg by mouth daily., Disp: , Rfl:  .  Multiple Vitamin (MULTIVITAMIN WITH MINERALS) TABS tablet, Take 1 tablet by mouth daily., Disp: , Rfl:  .  pantoprazole (PROTONIX) 40 MG tablet, Take 1 tablet (40 mg total) by mouth 2 (two) times daily., Disp: , Rfl:  .  sertraline (ZOLOFT) 50 MG tablet, Take 75 mg by mouth daily., Disp: , Rfl:  .  sildenafil (VIAGRA) 100 MG tablet, Take 100 mg by mouth daily as needed for erectile dysfunction., Disp: , Rfl:  .  tadalafil (CIALIS) 5 MG tablet, Take 5 mg by mouth daily as needed for erectile  dysfunction., Disp: , Rfl:  .  traMADol (ULTRAM) 50 MG tablet, Take 2 tablets (100 mg total) by mouth every 6 (six) hours as needed., Disp: 45 tablet, Rfl: 0 .  warfarin (COUMADIN) 2.5 MG tablet, Take 1 tablet (2.5 mg total) by mouth one time only at 6 PM. Adjust daily dose as needed per INR., Disp: , Rfl:  .  levofloxacin (LEVAQUIN) 500 MG tablet, Take 1 tablet (500 mg total) by mouth daily., Disp: 30 tablet, Rfl: 11 .  metroNIDAZOLE (FLAGYL) 500 MG tablet, Take 1 tablet (500 mg total) by mouth 3 (three) times daily., Disp: 90 tablet, Rfl: 11   Review of Systems  Constitutional: Negative for chills and fever.  HENT: Negative for congestion and sore throat.   Eyes: Negative for photophobia.  Respiratory: Negative for cough, shortness of breath and wheezing.   Cardiovascular: Negative for chest pain, palpitations  and leg swelling.  Gastrointestinal: Negative for abdominal pain, blood in stool, constipation, diarrhea, nausea and vomiting.  Genitourinary: Negative for dysuria, flank pain and hematuria.  Musculoskeletal: Positive for arthralgias and back pain. Negative for myalgias.  Skin: Negative for rash.  Neurological: Negative for dizziness, weakness and headaches.  Hematological: Does not bruise/bleed easily.  Psychiatric/Behavioral: Negative for suicidal ideas.       Objective:   Physical Exam  Constitutional: He is oriented to person, place, and time. He appears well-developed and well-nourished. No distress.  HENT:  Head: Normocephalic and atraumatic.  Mouth/Throat: No oropharyngeal exudate.  Eyes: Conjunctivae and EOM are normal. No scleral icterus.  Neck: Normal range of motion. Neck supple.  Cardiovascular: Normal rate and regular rhythm.   Pulmonary/Chest: Effort normal. No respiratory distress. He has no wheezes.  Abdominal: He exhibits no distension.  Musculoskeletal: He exhibits no edema or tenderness.  Neurological: He is alert and oriented to person, place, and time.  He exhibits normal muscle tone. Coordination normal.  Skin: Skin is warm and dry. No rash noted. He is not diaphoretic. No erythema. No pallor.  Psychiatric: He has a normal mood and affect. His behavior is normal. Judgment and thought content normal.    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 We will not be trying to also cover the diptheroid since we do not think this is likely driving much pathology here  He will be on protracted abx for at least 6 months including IV abx  Knee pathology: I am fine with him proceeding with knee surgery when Dr. DeMarlou Sas ready to do so. I would like him to stay on his suppressive antibiotics throughout.  We spen greater than 40 minutes with the patient including greater than 50% of time in face to face counsel of the patient re his polymicrobial infection near hardware and fractured bone, reviewing labs, radiographs and in coordination of his care with pharmacy, home health company, and personal phone call to Dr. HaMarcelino Scot

## 2017-01-30 ENCOUNTER — Telehealth: Payer: Self-pay

## 2017-01-30 ENCOUNTER — Telehealth: Payer: Self-pay | Admitting: Pharmacist

## 2017-01-30 ENCOUNTER — Telehealth (INDEPENDENT_AMBULATORY_CARE_PROVIDER_SITE_OTHER): Payer: Self-pay | Admitting: Orthopedic Surgery

## 2017-01-30 DIAGNOSIS — Z79899 Other long term (current) drug therapy: Secondary | ICD-10-CM

## 2017-01-30 NOTE — Telephone Encounter (Signed)
Patient called asked for a call back because he need to be referred to a doctor who can monitor his coumadin/INR while he is in [place]? The number to contact patient is [phone]

## 2017-01-30 NOTE — Telephone Encounter (Signed)
Called patient to discuss recent lab results.  Received labs yesterday and his INR was 4.  I called him and he states [place].  He said they just increased his dose and his goal is 2.5-3.5.  He will call clinic and tell them his latest INR.

## 2017-01-30 NOTE — Telephone Encounter (Signed)
Good question.  I asked him if he was going to transfer his care to some place close and he said no that he is from [place] ?

## 2017-01-30 NOTE — Telephone Encounter (Signed)
Thanks so much Cassie. I wonder how [place] is monitoring it from so remotely?

## 2017-01-30 NOTE — Telephone Encounter (Signed)
Patient left message on machine regarding his Coumadin. Please call.   Laurell Josephsammy K King, RN

## 2017-01-30 NOTE — Telephone Encounter (Signed)
Please advise. Thanks.  

## 2017-01-31 ENCOUNTER — Encounter: Payer: Self-pay | Admitting: Internal Medicine

## 2017-01-31 NOTE — Telephone Encounter (Signed)
Well I hope they can give him proper advice remotely

## 2017-02-03 DIAGNOSIS — Z79899 Other long term (current) drug therapy: Secondary | ICD-10-CM | POA: Insufficient documentation

## 2017-02-03 NOTE — Telephone Encounter (Signed)
Pls refer him to [facility] for periop management

## 2017-02-03 NOTE — Telephone Encounter (Signed)
I entered referral for patient. Can you please see if you can get patient scheduled or will they automatically call him since internal referral?  I tried calling him to let him know we were working on referral, no answer. LMVM advising.

## 2017-02-04 ENCOUNTER — Telehealth (INDEPENDENT_AMBULATORY_CARE_PROVIDER_SITE_OTHER): Payer: Self-pay | Admitting: Orthopedic Surgery

## 2017-02-04 NOTE — Telephone Encounter (Signed)
No breaststroke but ok to swim

## 2017-02-04 NOTE — Telephone Encounter (Signed)
IC s/w patient and advised  

## 2017-02-04 NOTE — Telephone Encounter (Signed)
Pt would like to know if he can swim

## 2017-02-04 NOTE — Telephone Encounter (Signed)
Please advise if ok to resume swimming. Thanks.

## 2017-02-06 NOTE — Telephone Encounter (Signed)
Referral sendt

## 2017-02-14 ENCOUNTER — Institutional Professional Consult (permissible substitution): Payer: BLUE CROSS/BLUE SHIELD | Admitting: Internal Medicine

## 2017-02-17 ENCOUNTER — Telehealth: Payer: Self-pay | Admitting: *Deleted

## 2017-02-17 NOTE — Telephone Encounter (Signed)
Patient called stating he is not feeling well on the Levaquin and flagyl. He has upset stomach and diarrhea roughly twice a day. He is also having taste aversion. I advised him I would send a note to our pharmacist and ask her to call him tomorrow morning.

## 2017-02-19 ENCOUNTER — Other Ambulatory Visit: Payer: Self-pay | Admitting: Pharmacist

## 2017-02-19 ENCOUNTER — Telehealth: Payer: Self-pay | Admitting: Pharmacist

## 2017-02-19 NOTE — Telephone Encounter (Signed)
I am going to cc Selena Batten so we can put him on the schedule either he does that or he establishes with someone on the [facility]

## 2017-02-19 NOTE — Telephone Encounter (Signed)
He did not have any trouble with the IV. I told him the first 2-4 weeks may be the toughest but to give it a chance to subside.  He said he would... so we may be ok for now. No sure about the follow-up, he doesn't have any scheduled with you.

## 2017-02-19 NOTE — Telephone Encounter (Signed)
I am not sure what to do for him. He has pelvic osteo associated with hardware and needs protracted abx. DIdnt he already have trouble with other abx?  Is he gong to followup with [place] or only in [place]?

## 2017-02-19 NOTE — Telephone Encounter (Signed)
Spoke to patient about side effects he is having on his Levaquin and Flagyl.  He states he is having loose stools (not watery) 3-4 times per day for the last week or so. He is also having "taste aversion" and loss of appetite.  He started these oral antibiotics about 2 weeks ago. He said he was tested for the "bad infection associated with diarrhea" and was negative (c diff).  He has only tried probiotics and yogurt for the diarrhea.  Instructed him to take imodium for diarrhea and let me know if it doesn't get better in a week or so. For the taste aversion, Flagyl can cause a metallic taste and when prompted he said yes that is what is occurring.  He isn't taking the medication with food, so I told him to try taking it with his yogurt or a meal or some bread to try and mitigate the metallic taste. He will call me in a week or so if these side effects do not get better. Told him I would let Dr. Daiva Eves know.

## 2017-02-25 ENCOUNTER — Telehealth (INDEPENDENT_AMBULATORY_CARE_PROVIDER_SITE_OTHER): Payer: Self-pay

## 2017-02-25 NOTE — Telephone Encounter (Signed)
Patient left voice mail inquiring about stopping Coumadin for upcoming surgery on 03/04/17.  Also, he is taking an antibiotic Rx'd by Dr. Carola Frost.  Should he keep taking that?  [phone]

## 2017-02-25 NOTE — Telephone Encounter (Signed)
Please advise. Thanks.  

## 2017-02-27 NOTE — Telephone Encounter (Signed)
I called the left message with Caleb Dawson.  Handy wants to take the plate out 1610 and then we can do the surgery a month after that that is per infectious disease recommendations

## 2017-02-28 ENCOUNTER — Inpatient Hospital Stay (HOSPITAL_COMMUNITY): Admission: RE | Admit: 2017-02-28 | Payer: BLUE CROSS/BLUE SHIELD | Source: Ambulatory Visit

## 2017-03-04 ENCOUNTER — Encounter (HOSPITAL_COMMUNITY): Admission: RE | Payer: Self-pay | Source: Ambulatory Visit

## 2017-03-04 ENCOUNTER — Inpatient Hospital Stay (HOSPITAL_COMMUNITY)
Admission: RE | Admit: 2017-03-04 | Payer: BLUE CROSS/BLUE SHIELD | Source: Ambulatory Visit | Admitting: Orthopedic Surgery

## 2017-03-04 SURGERY — ARTHROSCOPY, KNEE, WITH MCL RECONSTRUCTION
Anesthesia: General | Site: Knee | Laterality: Left

## 2017-03-05 ENCOUNTER — Ambulatory Visit (INDEPENDENT_AMBULATORY_CARE_PROVIDER_SITE_OTHER): Payer: BLUE CROSS/BLUE SHIELD | Admitting: Infectious Disease

## 2017-03-05 ENCOUNTER — Encounter: Payer: Self-pay | Admitting: Infectious Disease

## 2017-03-05 VITALS — BP 130/80 | HR 63 | Temp 97.6°F | Ht 68.5 in | Wt 165.0 lb

## 2017-03-05 DIAGNOSIS — I38 Endocarditis, valve unspecified: Secondary | ICD-10-CM

## 2017-03-05 DIAGNOSIS — A499 Bacterial infection, unspecified: Secondary | ICD-10-CM | POA: Diagnosis not present

## 2017-03-05 DIAGNOSIS — S329XXD Fracture of unspecified parts of lumbosacral spine and pelvis, subsequent encounter for fracture with routine healing: Secondary | ICD-10-CM

## 2017-03-05 DIAGNOSIS — Z952 Presence of prosthetic heart valve: Secondary | ICD-10-CM | POA: Diagnosis not present

## 2017-03-05 DIAGNOSIS — T847XXD Infection and inflammatory reaction due to other internal orthopedic prosthetic devices, implants and grafts, subsequent encounter: Secondary | ICD-10-CM | POA: Diagnosis not present

## 2017-03-05 DIAGNOSIS — R630 Anorexia: Secondary | ICD-10-CM

## 2017-03-05 DIAGNOSIS — M86151 Other acute osteomyelitis, right femur: Secondary | ICD-10-CM

## 2017-03-05 DIAGNOSIS — T826XXD Infection and inflammatory reaction due to cardiac valve prosthesis, subsequent encounter: Secondary | ICD-10-CM

## 2017-03-05 HISTORY — DX: Anorexia: R63.0

## 2017-03-05 NOTE — Progress Notes (Signed)
Subjective:   Chief complaint: nausea and bad taste in his mouth reduced appetite on levaquin and flagyl   Patient ID: Caleb Dawson, male    DOB: [birth year], 66 y.o.   MRN: [id number]  HPI  66 y.o. 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  I treated him with IV invanz and vancomycin to cover for ALL of these organisms.  He had done well for the most part. He did see Dr. Marcelino Scot before my last visit  who was concerned he said due to loosening of hardware attributed to the pt bearing weight too much on this side.  His ESR is actually down to 16 on most recent check with home labs.  He was having  surgery on both knees contemplated by Dr. Marlou Sa.  We have concern about persistence of polymicrobial infection involving the hardware. I DC his PICC and put him on levaquin to cover the morganella (S to FQ) and his anerobes. He has had trouble tolerating this with poor appetite, nausea and loose stools but has continued it.   I had discussion with Dr. Marcelino Scot last week and we both felt best option in [place] was to have his hardware removed and then follow this with IV antibiotics in hopes of curing his polymicrobial infection.  He is scheduled for surgery next week though his anticoagulation for Prosthetic valve and bridging will need to be worked out.  Past Medical History:  Diagnosis Date  . Bicuspid aortic valve    s/p Bentall procedure 2013  . Hypothyroid     Past Surgical History:  Procedure Laterality Date  . AORTIC VALVE REPLACEMENT  2013   Bentall procedure w/ mechanical Aortic valve, post-op afib, post-op CVA req feeding tube, post-op tamponade  . APPLICATION OF WOUND VAC Left 11/28/2016   Procedure: APPLICATION OF WOUND VAC;  Surgeon: Altamese [place], MD;  Location: [place];  Service: Orthopedics;   Laterality: Left;  . CLOSED REDUCTION FINGER WITH PERCUTANEOUS PINNING Right 12/07/2016   Procedure: CLOSED REDUCTION FINGER WITH PERCUTANEOUS PINNING;  Surgeon: Leanora Cover, MD;  Location: [place];  Service: Orthopedics;  Laterality: Right;  . EXTERNAL FIXATION PELVIS N/A 11/28/2016   Procedure: EXTERNAL FIXATION PELVIS;  Surgeon: Altamese [place], MD;  Location: [place];  Service: Orthopedics;  Laterality: N/A;  . EXTERNAL FIXATION REMOVAL N/A 12/10/2016   Procedure: REMOVAL EXTERNAL FIXATION PELVIS;  Surgeon: Altamese [place], MD;  Location: [place];  Service: Orthopedics;  Laterality: N/A;  . I&D EXTREMITY Bilateral 11/28/2016   Procedure: IRRIGATION AND DEBRIDEMENT EXTREMITY LOWER ANTERIOR LEGs;  Surgeon: Altamese [place]. [place], MD;  Location: [place];  Service: Orthopedics;  Laterality: Bilateral;  . I&D EXTREMITY N/A 12/19/2016   Procedure: IRRIGATION AND DEBRIDEMENT PELVIS;  Surgeon: Altamese [place], MD;  Location: [place];  Service: Orthopedics;  Laterality: N/A;  . I&D EXTREMITY N/A 12/21/2016   Procedure: IRRIGATION AND DEBRIDEMENT PELVIS WITH VAC CHANGE;  Surgeon: Altamese [place], MD;  Location: [place];  Service: Orthopedics;  Laterality: N/A;  . INCISION AND DRAINAGE OF WOUND N/A 12/24/2016   Procedure: IRRIGATION AND DEBRIDEMENT WOUND WITH LAYERED CLOSURE;  Surgeon: Altamese , MD;  Location: [place];  Service: Orthopedics;  Laterality: N/A;  . IR HYBRID TRAUMA EMBOLIZATION  11/28/2016  . ORIF PELVIC FRACTURE N/A 12/10/2016   Procedure: OPEN REDUCTION INTERNAL FIXATION (ORIF) PELVIC  FRACTURE;  Surgeon: Altamese [place]ades, MD;  Location: [place];  Service: Orthopedics;  Laterality: N/A;  . PELVIC ANGIOGRAPHY N/A 11/28/2016   Procedure: PELVIC ANGIOGRAPHY CPT 56812;  Surgeon: Corrie Mckusick, DO;  Location: [place];  Service: Anesthesiology;  Laterality: N/A;  . SACROILIAC JOINT FUSION Bilateral 11/28/2016   Procedure: SACROILIAC JOINT FUSION;  Surgeon: Altamese [place], MD;  Location: [place];  Service: Orthopedics;  Laterality:  Bilateral;  . TEE WITHOUT CARDIOVERSION N/A 12/27/2016   Procedure: TRANSESOPHAGEAL ECHOCARDIOGRAM (TEE);  Surgeon: Pixie Casino, MD;  Location: [facility] ENDOSCOPY;  Service: Cardiovascular;  Laterality: N/A;  . ULTRASOUND GUIDANCE FOR VASCULAR ACCESS Left 11/28/2016   Procedure: ULTRASOUND GUIDANCE FOR VASCULAR ACCESS, left iliac artery CPT 75170;  Surgeon: Corrie Mckusick, DO;  Location: [place];  Service: Anesthesiology;  Laterality: Left;    Family History  Problem Relation Age of Onset  . Hypertension Mother        born in [birth year]  . Anemia Mother   . Sudden Cardiac Death Father 62      Social History   Social History  . Marital status: Single    Spouse name: N/A  . Number of children: N/A  . Years of education: N/A   Occupational History  . Climatologist, geologist    Social History Main Topics  . Smoking status: Never Smoker  . Smokeless tobacco: Never Used  . Alcohol use No  . Drug use: No  . Sexual activity: Not Asked   Other Topics Concern  . None   Social History Narrative   Pt lives with his wife.    Allergies  Allergen Reactions  . Scallops [Shellfish Allergy] Nausea And Vomiting     Current Outpatient Prescriptions:  .  acetaminophen (TYLENOL) 500 MG tablet, Take 1,000 mg by mouth every 6 (six) hours as needed for moderate pain or headache., Disp: , Rfl:  .  levofloxacin (LEVAQUIN) 500 MG tablet, Take 1 tablet (500 mg total) by mouth daily., Disp: 30 tablet, Rfl: 11 .  levothyroxine (SYNTHROID, LEVOTHROID) 50 MCG tablet, Take 50 mcg by mouth daily., Disp: , Rfl:  .  metroNIDAZOLE (FLAGYL) 500 MG tablet, Take 1 tablet (500 mg total) by mouth 3 (three) times daily., Disp: 90 tablet, Rfl: 11 .  Probiotic Product (PROBIOTIC PO), Take 1 capsule by mouth 2 (two) times daily., Disp: , Rfl:  .  sertraline (ZOLOFT) 50 MG tablet, Take 75 mg by mouth daily., Disp: , Rfl:  .  traMADol (ULTRAM) 50 MG tablet, Take 2 tablets (100 mg total) by mouth every 6 (six) hours as  needed. (Patient taking differently: Take 100 mg by mouth every 6 (six) hours as needed for moderate pain. ), Disp: 45 tablet, Rfl: 0 .  valACYclovir (VALTREX) 1000 MG tablet, Take 1,000 mg by mouth daily., Disp: , Rfl:  .  warfarin (COUMADIN) 2.5 MG tablet, Take 1 tablet (2.5 mg total) by mouth one time only at 6 PM. Adjust daily dose as needed per INR. (Patient taking differently: Take 7.5-11.25 mg by mouth See admin instructions. Take 7.5 mg by mouth daily on Monday, Wednesday, Friday, Saturday and Sunday. Take 11.25 mg by mouth daily on Tuesday and Thursday.), Disp: , Rfl:  .  Multiple Vitamin (MULTIVITAMIN WITH MINERALS) TABS tablet, Take 1 tablet by mouth daily. (Patient not taking: Reported on 02/25/2017), Disp: , Rfl:    Review of Systems  Constitutional: Positive for appetite change. Negative for chills and fever.  HENT: Negative for congestion and sore throat.  Eyes: Negative for photophobia.  Respiratory: Negative for cough, shortness of breath and wheezing.   Cardiovascular: Negative for chest pain, palpitations and leg swelling.  Gastrointestinal: Positive for diarrhea and nausea. Negative for abdominal pain, blood in stool, constipation and vomiting.  Genitourinary: Negative for dysuria, flank pain and hematuria.  Musculoskeletal: Positive for arthralgias. Negative for myalgias.  Skin: Negative for rash.  Neurological: Negative for dizziness, weakness and headaches.  Hematological: Does not bruise/bleed easily.  Psychiatric/Behavioral: Negative for suicidal ideas.       Objective:   Physical Exam  Constitutional: He is oriented to person, place, and time. He appears well-developed and well-nourished. No distress.  HENT:  Head: Normocephalic and atraumatic.  Mouth/Throat: No oropharyngeal exudate.  Eyes: Conjunctivae and EOM are normal. No scleral icterus.  Neck: Normal range of motion. Neck supple.  Cardiovascular: Normal rate and regular rhythm.   Pulmonary/Chest: Effort  normal. No respiratory distress. He has no wheezes.  Abdominal: Soft. He exhibits no distension.  Musculoskeletal: He exhibits no edema or tenderness.  Neurological: He is alert and oriented to person, place, and time. He exhibits normal muscle tone. Coordination normal.  Skin: Skin is warm and dry. No rash noted. He is not diaphoretic. No erythema. No pallor.  Psychiatric: He has a normal mood and affect. His behavior is normal. Judgment and thought content normal.         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 He will have hardware extracted next week and fresh cultures though we may not get any new data they could still be helpful. We will then give him 6 weeks of IV abx post surgery with therapy to cover prior organisms and any new one   Knee pathology: Dr. DMarlou Sawanting to do this later  Nausea and poor appetite: I expect MOST of this is from the flagyl.   I spent greater than 25 minutes with the patient including greater than 50% of time in face to face counsel of the patient re his polymicrobial infection with hardware, osteo of the pelvis, prosthetic valve, nausea and in coordination of his care.

## 2017-03-06 LAB — COMPLETE METABOLIC PANEL WITH GFR
AG RATIO: 1.6 (calc) (ref 1.0–2.5)
ALKALINE PHOSPHATASE (APISO): 101 U/L (ref 40–115)
ALT: 24 U/L (ref 9–46)
AST: 28 U/L (ref 10–35)
Albumin: 4.2 g/dL (ref 3.6–5.1)
BILIRUBIN TOTAL: 0.5 mg/dL (ref 0.2–1.2)
BUN: 14 mg/dL (ref 7–25)
CHLORIDE: 98 mmol/L (ref 98–110)
CO2: 26 mmol/L (ref 20–32)
Calcium: 9 mg/dL (ref 8.6–10.3)
Creat: 0.98 mg/dL (ref 0.70–1.25)
GFR, Est African American: 94 mL/min/{1.73_m2} (ref >=60)
GFR, Est Non African American: 81 mL/min/{1.73_m2} (ref >=60)
GLOBULIN: 2.6 g/dL (ref 1.9–3.7)
Glucose, Bld: 78 mg/dL (ref 65–99)
POTASSIUM: 4.2 mmol/L (ref 3.5–5.3)
SODIUM: 135 mmol/L (ref 135–146)
Total Protein: 6.8 g/dL (ref 6.1–8.1)

## 2017-03-06 LAB — CBC WITH DIFFERENTIAL/PLATELET
BASOS PCT: 1.1 %
Basophils Absolute: 48 cells/uL (ref 0–200)
EOS ABS: 189 {cells}/uL (ref 15–500)
Eosinophils Relative: 4.3 %
HCT: 40.8 % (ref 38.5–50.0)
HEMOGLOBIN: 13.9 g/dL (ref 13.2–17.1)
Lymphs Abs: 1201 cells/uL (ref 850–3900)
MCH: 31 pg (ref 27.0–33.0)
MCHC: 34.1 g/dL (ref 32.0–36.0)
MCV: 90.9 fL (ref 80.0–100.0)
MONOS PCT: 8.1 %
MPV: 9.4 fL (ref 7.5–12.5)
NEUTROS ABS: 2605 {cells}/uL (ref 1500–7800)
Neutrophils Relative %: 59.2 %
Platelets: 212 10*3/uL (ref 140–400)
RBC: 4.49 10*6/uL (ref 4.20–5.80)
RDW: 14.1 % (ref 11.0–15.0)
Total Lymphocyte: 27.3 %
WBC: 4.4 10*3/uL (ref 3.8–10.8)
WBCMIX: 356 {cells}/uL (ref 200–950)

## 2017-03-06 LAB — C-REACTIVE PROTEIN: CRP: 2.1 mg/L (ref <8.0)

## 2017-03-06 LAB — SEDIMENTATION RATE: Sed Rate: 9 mm/h (ref 0–20)

## 2017-03-06 LAB — SPECIMEN COMPROMISED

## 2017-03-07 ENCOUNTER — Encounter (HOSPITAL_COMMUNITY): Payer: Self-pay | Admitting: *Deleted

## 2017-03-07 NOTE — Progress Notes (Signed)
   03/07/17 1849  OBSTRUCTIVE SLEEP APNEA  Have you ever been diagnosed with sleep apnea through a sleep study? No  Do you snore loudly (loud enough to be heard through closed doors)?  1  Do you often feel tired, fatigued, or sleepy during the daytime (such as falling asleep during driving or talking to someone)? 1  Has anyone observed you stop breathing during your sleep? 1  Do you have, or are you being treated for high blood pressure? 0  BMI more than 35 kg/m2? 0  Age > 50 (1-yes) 1  Male Gender (Yes=1) 1  Obstructive Sleep Apnea Score 5

## 2017-03-07 NOTE — Progress Notes (Signed)
Pt SDW-Pre-op call completed by pt and pt significant other, [name] ( with pt verbal consent) . Pt denies SOB and chest pain. Pt under the care of Dr. Rica RecordsHassel, Cardiology, [place] [facility] [place]; cardiac records requested. Pt made aware to stop taking vitamins, fish oil and herbal medications. Do not take any NSAIDs ie: Ibuprofen, Advil, Naproxen (Aleve), Motrin, BC and Goody Powder. [name] stated, last dose of Coumadin was Wednesday and started Lovenox bridge Thursday (last dose night before surgery). [name], verbalized understanding of all pre-op instructions.
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
## 2017-03-11 ENCOUNTER — Encounter (HOSPITAL_COMMUNITY): Payer: Self-pay | Admitting: *Deleted

## 2017-03-11 ENCOUNTER — Inpatient Hospital Stay (HOSPITAL_COMMUNITY)
Admission: RE | Admit: 2017-03-11 | Discharge: 2017-03-14 | DRG: 496 | Disposition: A | Discharge status: Home / Self Care | Payer: BLUE CROSS/BLUE SHIELD | Source: Ambulatory Visit | Attending: Orthopedic Surgery | Admitting: Orthopedic Surgery

## 2017-03-11 ENCOUNTER — Inpatient Hospital Stay (HOSPITAL_COMMUNITY): Payer: BLUE CROSS/BLUE SHIELD

## 2017-03-11 ENCOUNTER — Inpatient Hospital Stay (HOSPITAL_COMMUNITY): Payer: BLUE CROSS/BLUE SHIELD | Admitting: Vascular Surgery

## 2017-03-11 ENCOUNTER — Encounter (HOSPITAL_COMMUNITY)
Admission: RE | Disposition: A | Discharge status: Home / Self Care | Payer: Self-pay | Source: Ambulatory Visit | Attending: Orthopedic Surgery

## 2017-03-11 DIAGNOSIS — Z981 Arthrodesis status: Secondary | ICD-10-CM

## 2017-03-11 DIAGNOSIS — Z79899 Other long term (current) drug therapy: Secondary | ICD-10-CM | POA: Diagnosis not present

## 2017-03-11 DIAGNOSIS — M8628 Subacute osteomyelitis, other site: Secondary | ICD-10-CM

## 2017-03-11 DIAGNOSIS — Y838 Other surgical procedures as the cause of abnormal reaction of the patient, or of later complication, without mention of misadventure at the time of the procedure: Secondary | ICD-10-CM | POA: Diagnosis present

## 2017-03-11 DIAGNOSIS — M869 Osteomyelitis, unspecified: Secondary | ICD-10-CM | POA: Diagnosis present

## 2017-03-11 DIAGNOSIS — T8469XA Infection and inflammatory reaction due to internal fixation device of other site, initial encounter: Secondary | ICD-10-CM | POA: Diagnosis present

## 2017-03-11 DIAGNOSIS — Z951 Presence of aortocoronary bypass graft: Secondary | ICD-10-CM

## 2017-03-11 DIAGNOSIS — Z952 Presence of prosthetic heart valve: Secondary | ICD-10-CM

## 2017-03-11 DIAGNOSIS — M868X8 Other osteomyelitis, other site: Secondary | ICD-10-CM | POA: Diagnosis present

## 2017-03-11 DIAGNOSIS — Q231 Congenital insufficiency of aortic valve: Secondary | ICD-10-CM

## 2017-03-11 DIAGNOSIS — D62 Acute posthemorrhagic anemia: Secondary | ICD-10-CM | POA: Diagnosis not present

## 2017-03-11 DIAGNOSIS — Z79891 Long term (current) use of opiate analgesic: Secondary | ICD-10-CM | POA: Diagnosis not present

## 2017-03-11 DIAGNOSIS — D72819 Decreased white blood cell count, unspecified: Secondary | ICD-10-CM | POA: Diagnosis not present

## 2017-03-11 DIAGNOSIS — Z91013 Allergy to seafood: Secondary | ICD-10-CM | POA: Diagnosis not present

## 2017-03-11 DIAGNOSIS — Z8673 Personal history of transient ischemic attack (TIA), and cerebral infarction without residual deficits: Secondary | ICD-10-CM

## 2017-03-11 DIAGNOSIS — Z79890 Hormone replacement therapy (postmenopausal): Secondary | ICD-10-CM

## 2017-03-11 DIAGNOSIS — E039 Hypothyroidism, unspecified: Secondary | ICD-10-CM | POA: Diagnosis present

## 2017-03-11 DIAGNOSIS — Z419 Encounter for procedure for purposes other than remedying health state, unspecified: Secondary | ICD-10-CM

## 2017-03-11 DIAGNOSIS — I441 Atrioventricular block, second degree: Secondary | ICD-10-CM | POA: Diagnosis not present

## 2017-03-11 DIAGNOSIS — Z7901 Long term (current) use of anticoagulants: Secondary | ICD-10-CM

## 2017-03-11 HISTORY — DX: Headache: R51

## 2017-03-11 HISTORY — DX: Cardiac tamponade: I31.4

## 2017-03-11 HISTORY — PX: HARDWARE REMOVAL: SHX979

## 2017-03-11 HISTORY — DX: Osteomyelitis, unspecified: M86.9

## 2017-03-11 HISTORY — PX: BONE EXCISION: SHX6730

## 2017-03-11 HISTORY — DX: Headache, unspecified: R51.9

## 2017-03-11 HISTORY — DX: Unspecified osteoarthritis, unspecified site: M19.90

## 2017-03-11 HISTORY — DX: Peritonitis, unspecified: K65.9

## 2017-03-11 HISTORY — DX: Cerebral infarction, unspecified: I63.9

## 2017-03-11 HISTORY — DX: Long term (current) use of anticoagulants: Z79.01

## 2017-03-11 HISTORY — DX: Pneumonia, unspecified organism: J18.9

## 2017-03-11 LAB — COMPREHENSIVE METABOLIC PANEL
ALK PHOS: 86 U/L (ref 38–126)
ALT: 20 U/L (ref 17–63)
AST: 24 U/L (ref 15–41)
Albumin: 3.8 g/dL (ref 3.5–5.0)
Anion gap: 9 (ref 5–15)
BILIRUBIN TOTAL: 0.9 mg/dL (ref 0.3–1.2)
BUN: 7 mg/dL (ref 6–20)
CALCIUM: 8.8 mg/dL — AB (ref 8.9–10.3)
CO2: 22 mmol/L (ref 22–32)
CREATININE: 0.73 mg/dL (ref 0.61–1.24)
Chloride: 103 mmol/L (ref 101–111)
GFR calc non Af Amer: >60 mL/min (ref >60)
Glucose, Bld: 103 mg/dL — ABNORMAL HIGH (ref 65–99)
Potassium: 4.1 mmol/L (ref 3.5–5.1)
SODIUM: 134 mmol/L — AB (ref 135–145)
TOTAL PROTEIN: 6.6 g/dL (ref 6.5–8.1)

## 2017-03-11 LAB — URINALYSIS, ROUTINE W REFLEX MICROSCOPIC
BACTERIA UA: NONE SEEN
Bilirubin Urine: NEGATIVE
GLUCOSE, UA: NEGATIVE mg/dL
Hgb urine dipstick: NEGATIVE
Ketones, ur: NEGATIVE mg/dL
NITRITE: NEGATIVE
PROTEIN: 30 mg/dL — AB
Specific Gravity, Urine: 1.025 (ref 1.005–1.030)
pH: 5 (ref 5.0–8.0)

## 2017-03-11 LAB — CBC WITH DIFFERENTIAL/PLATELET
Basophils Absolute: 0 10*3/uL (ref 0.0–0.1)
Basophils Relative: 1 %
EOS ABS: 0.2 10*3/uL (ref 0.0–0.7)
Eosinophils Relative: 6 %
HCT: 37 % — ABNORMAL LOW (ref 39.0–52.0)
HEMOGLOBIN: 12.4 g/dL — AB (ref 13.0–17.0)
LYMPHS ABS: 1 10*3/uL (ref 0.7–4.0)
LYMPHS PCT: 37 %
MCH: 30.4 pg (ref 26.0–34.0)
MCHC: 33.5 g/dL (ref 30.0–36.0)
MCV: 90.7 fL (ref 78.0–100.0)
MONOS PCT: 8 %
Monocytes Absolute: 0.2 10*3/uL (ref 0.1–1.0)
NEUTROS ABS: 1.3 10*3/uL — AB (ref 1.7–7.7)
NEUTROS PCT: 48 %
Platelets: 171 10*3/uL (ref 150–400)
RBC: 4.08 MIL/uL — AB (ref 4.22–5.81)
RDW: 14.1 % (ref 11.5–15.5)
WBC: 2.7 10*3/uL — AB (ref 4.0–10.5)

## 2017-03-11 LAB — PROTIME-INR
INR: 1.14
Prothrombin Time: 14.5 seconds (ref 11.4–15.2)

## 2017-03-11 LAB — SEDIMENTATION RATE: Sed Rate: 16 mm/hr (ref 0–16)

## 2017-03-11 LAB — C-REACTIVE PROTEIN: CRP: <0.8 mg/dL (ref <1.0)

## 2017-03-11 LAB — APTT: aPTT: 24 seconds (ref 24–36)

## 2017-03-11 SURGERY — REMOVAL, HARDWARE
Anesthesia: General

## 2017-03-11 MED ORDER — CLINDAMYCIN PHOSPHATE 900 MG/50ML IV SOLN
900.0000 mg | INTRAVENOUS | Status: AC
  Administered 2017-03-11: 900 mg via INTRAVENOUS
  Filled 2017-03-11: qty 50

## 2017-03-11 MED ORDER — KETOROLAC TROMETHAMINE 30 MG/ML IJ SOLN: 30.0000 mg | Freq: Once | INTRAMUSCULAR | Status: DC | PRN

## 2017-03-11 MED ORDER — SODIUM CHLORIDE 0.9% FLUSH: 10.0000 mL | Freq: Two times a day (BID) | INTRAVENOUS | Status: DC

## 2017-03-11 MED ORDER — PIPERACILLIN-TAZOBACTAM 3.375 G IVPB
3.3750 g | Freq: Three times a day (TID) | INTRAVENOUS | Status: AC
  Administered 2017-03-11 – 2017-03-13 (×6): 3.375 g via INTRAVENOUS
  Filled 2017-03-11 (×7): qty 50

## 2017-03-11 MED ORDER — WARFARIN - PHARMACIST DOSING INPATIENT: Freq: Every day | Status: DC

## 2017-03-11 MED ORDER — LIDOCAINE HCL (CARDIAC) 20 MG/ML IV SOLN
INTRAVENOUS | Status: DC | PRN
  Administered 2017-03-11: 100 mg via INTRAVENOUS

## 2017-03-11 MED ORDER — ACETAMINOPHEN 500 MG PO TABS: 1000.0000 mg | ORAL_TABLET | Freq: Four times a day (QID) | ORAL | Status: DC | PRN

## 2017-03-11 MED ORDER — CHLORHEXIDINE GLUCONATE 4 % EX LIQD: 60.0000 mL | Freq: Once | CUTANEOUS | Status: DC

## 2017-03-11 MED ORDER — TOBRAMYCIN SULFATE 80 MG/2ML IJ SOLN
INTRAMUSCULAR | Status: AC
  Filled 2017-03-11: qty 2

## 2017-03-11 MED ORDER — GENTAMICIN SULFATE 40 MG/ML IJ SOLN
INTRAMUSCULAR | Status: DC | PRN
  Administered 2017-03-11: 160 mg via INTRAMUSCULAR

## 2017-03-11 MED ORDER — VANCOMYCIN HCL 1000 MG IV SOLR
INTRAVENOUS | Status: AC
  Filled 2017-03-11: qty 1000

## 2017-03-11 MED ORDER — HYDROMORPHONE HCL 1 MG/ML IJ SOLN
0.2500 mg | INTRAMUSCULAR | Status: DC | PRN
  Administered 2017-03-11: 0.5 mg via INTRAVENOUS

## 2017-03-11 MED ORDER — VALACYCLOVIR HCL 500 MG PO TABS
1000.0000 mg | ORAL_TABLET | Freq: Every day | ORAL | Status: DC
  Administered 2017-03-11 – 2017-03-14 (×4): 1000 mg via ORAL
  Filled 2017-03-11 (×4): qty 2

## 2017-03-11 MED ORDER — ONDANSETRON HCL 4 MG/2ML IJ SOLN
INTRAMUSCULAR | Status: DC | PRN
  Administered 2017-03-11: 4 mg via INTRAVENOUS

## 2017-03-11 MED ORDER — SODIUM CHLORIDE 0.9% FLUSH: 10.0000 mL | INTRAVENOUS | Status: DC | PRN

## 2017-03-11 MED ORDER — HYDROMORPHONE HCL 1 MG/ML IJ SOLN
INTRAMUSCULAR | Status: AC
  Filled 2017-03-11: qty 1

## 2017-03-11 MED ORDER — HYDROMORPHONE HCL 1 MG/ML IJ SOLN: 0.5000 mg | INTRAMUSCULAR | Status: DC | PRN

## 2017-03-11 MED ORDER — ONDANSETRON HCL 4 MG/2ML IJ SOLN: 4.0000 mg | Freq: Four times a day (QID) | INTRAMUSCULAR | Status: DC | PRN

## 2017-03-11 MED ORDER — VANCOMYCIN HCL 1000 MG IV SOLR
INTRAVENOUS | Status: DC | PRN
  Administered 2017-03-11 (×2): 1000 mg via TOPICAL

## 2017-03-11 MED ORDER — VANCOMYCIN HCL 10 G IV SOLR
1250.0000 mg | Freq: Two times a day (BID) | INTRAVENOUS | Status: DC
  Administered 2017-03-12 – 2017-03-14 (×6): 1250 mg via INTRAVENOUS
  Filled 2017-03-11 (×7): qty 1250

## 2017-03-11 MED ORDER — PROPOFOL 10 MG/ML IV BOLUS
INTRAVENOUS | Status: DC | PRN
  Administered 2017-03-11: 50 mg via INTRAVENOUS
  Administered 2017-03-11: 150 mg via INTRAVENOUS

## 2017-03-11 MED ORDER — LIDOCAINE 2% (20 MG/ML) 5 ML SYRINGE
INTRAMUSCULAR | Status: AC
  Filled 2017-03-11: qty 5

## 2017-03-11 MED ORDER — ADULT MULTIVITAMIN W/MINERALS CH: 1.0000 | ORAL_TABLET | Freq: Every day | ORAL | Status: DC

## 2017-03-11 MED ORDER — PHENYLEPHRINE HCL 10 MG/ML IJ SOLN
INTRAMUSCULAR | Status: DC | PRN
  Administered 2017-03-11: 30 ug/min via INTRAVENOUS

## 2017-03-11 MED ORDER — PHENYLEPHRINE 40 MCG/ML (10ML) SYRINGE FOR IV PUSH (FOR BLOOD PRESSURE SUPPORT)
PREFILLED_SYRINGE | INTRAVENOUS | Status: AC
  Filled 2017-03-11: qty 10

## 2017-03-11 MED ORDER — ROCURONIUM BROMIDE 100 MG/10ML IV SOLN
INTRAVENOUS | Status: DC | PRN
Start: 2017-03-11 — End: 2017-03-11
  Administered 2017-03-11: 60 mg via INTRAVENOUS
  Administered 2017-03-11: 10 mg via INTRAVENOUS

## 2017-03-11 MED ORDER — MEPERIDINE HCL 25 MG/ML IJ SOLN: 6.2500 mg | INTRAMUSCULAR | Status: DC | PRN

## 2017-03-11 MED ORDER — TRAMADOL HCL 50 MG PO TABS
50.0000 mg | ORAL_TABLET | Freq: Four times a day (QID) | ORAL | Status: DC | PRN
  Administered 2017-03-11: 50 mg via ORAL
  Administered 2017-03-12 – 2017-03-13 (×2): 100 mg via ORAL
  Filled 2017-03-11: qty 1
  Filled 2017-03-11 (×2): qty 2

## 2017-03-11 MED ORDER — VANCOMYCIN HCL 10 G IV SOLR
1500.0000 mg | Freq: Once | INTRAVENOUS | Status: AC
  Administered 2017-03-11: 1500 mg via INTRAVENOUS
  Filled 2017-03-11: qty 1500

## 2017-03-11 MED ORDER — FENTANYL CITRATE (PF) 100 MCG/2ML IJ SOLN
INTRAMUSCULAR | Status: DC | PRN
  Administered 2017-03-11: 50 ug via INTRAVENOUS
  Administered 2017-03-11: 150 ug via INTRAVENOUS
  Administered 2017-03-11: 50 ug via INTRAVENOUS

## 2017-03-11 MED ORDER — LACTATED RINGERS IV SOLN
INTRAVENOUS | Status: DC
  Administered 2017-03-11 (×2): via INTRAVENOUS

## 2017-03-11 MED ORDER — LEVOTHYROXINE SODIUM 50 MCG PO TABS
50.0000 ug | ORAL_TABLET | Freq: Every day | ORAL | Status: DC
  Administered 2017-03-12 – 2017-03-14 (×3): 50 ug via ORAL
  Filled 2017-03-11 (×3): qty 1

## 2017-03-11 MED ORDER — PROPOFOL 10 MG/ML IV BOLUS
INTRAVENOUS | Status: AC
  Filled 2017-03-11: qty 20

## 2017-03-11 MED ORDER — 0.9 % SODIUM CHLORIDE (POUR BTL) OPTIME
TOPICAL | Status: DC | PRN
  Administered 2017-03-11: 1000 mL

## 2017-03-11 MED ORDER — DEXAMETHASONE SODIUM PHOSPHATE 10 MG/ML IJ SOLN
INTRAMUSCULAR | Status: AC
  Filled 2017-03-11: qty 1

## 2017-03-11 MED ORDER — ONDANSETRON HCL 4 MG PO TABS: 4.0000 mg | ORAL_TABLET | Freq: Four times a day (QID) | ORAL | Status: DC | PRN

## 2017-03-11 MED ORDER — DOCUSATE SODIUM 100 MG PO CAPS
100.0000 mg | ORAL_CAPSULE | Freq: Two times a day (BID) | ORAL | Status: DC
  Administered 2017-03-11 – 2017-03-14 (×3): 100 mg via ORAL
  Filled 2017-03-11 (×7): qty 1

## 2017-03-11 MED ORDER — ROCURONIUM BROMIDE 10 MG/ML (PF) SYRINGE
PREFILLED_SYRINGE | INTRAVENOUS | Status: AC
  Filled 2017-03-11: qty 5

## 2017-03-11 MED ORDER — PROMETHAZINE HCL 25 MG/ML IJ SOLN: 6.2500 mg | INTRAMUSCULAR | Status: DC | PRN

## 2017-03-11 MED ORDER — DEXAMETHASONE SODIUM PHOSPHATE 10 MG/ML IJ SOLN
INTRAMUSCULAR | Status: DC | PRN
  Administered 2017-03-11: 10 mg via INTRAVENOUS

## 2017-03-11 MED ORDER — METOCLOPRAMIDE HCL 5 MG/ML IJ SOLN: 5.0000 mg | Freq: Three times a day (TID) | INTRAMUSCULAR | Status: DC | PRN

## 2017-03-11 MED ORDER — TOBRAMYCIN SULFATE 1.2 G IJ SOLR
INTRAMUSCULAR | Status: AC
  Filled 2017-03-11: qty 1.2

## 2017-03-11 MED ORDER — SUGAMMADEX SODIUM 200 MG/2ML IV SOLN
INTRAVENOUS | Status: DC | PRN
  Administered 2017-03-11: 175 mg via INTRAVENOUS

## 2017-03-11 MED ORDER — POTASSIUM CHLORIDE IN NACL 20-0.9 MEQ/L-% IV SOLN
INTRAVENOUS | Status: DC
  Administered 2017-03-11 – 2017-03-14 (×3): via INTRAVENOUS
  Filled 2017-03-11 (×3): qty 1000

## 2017-03-11 MED ORDER — GENTAMICIN SULFATE 40 MG/ML IJ SOLN
INTRAMUSCULAR | Status: AC
Start: 2017-03-11 — End: 2017-03-11
  Filled 2017-03-11: qty 2

## 2017-03-11 MED ORDER — FENTANYL CITRATE (PF) 250 MCG/5ML IJ SOLN
INTRAMUSCULAR | Status: AC
  Filled 2017-03-11: qty 5

## 2017-03-11 MED ORDER — SERTRALINE HCL 50 MG PO TABS
75.0000 mg | ORAL_TABLET | Freq: Every day | ORAL | Status: DC
  Administered 2017-03-11 – 2017-03-14 (×4): 75 mg via ORAL
  Filled 2017-03-11 (×4): qty 1

## 2017-03-11 MED ORDER — ENOXAPARIN SODIUM 80 MG/0.8ML ~~LOC~~ SOLN
75.0000 mg | Freq: Two times a day (BID) | SUBCUTANEOUS | Status: DC
  Administered 2017-03-11 – 2017-03-14 (×6): 75 mg via SUBCUTANEOUS
  Filled 2017-03-11 (×6): qty 0.8

## 2017-03-11 MED ORDER — TOBRAMYCIN SULFATE 1.2 G IJ SOLR
INTRAMUSCULAR | Status: DC | PRN
  Administered 2017-03-11 (×2): 80 mg via TOPICAL

## 2017-03-11 MED ORDER — PHENYLEPHRINE HCL 10 MG/ML IJ SOLN
INTRAMUSCULAR | Status: DC | PRN
  Administered 2017-03-11: 120 ug via INTRAVENOUS
  Administered 2017-03-11: 80 ug via INTRAVENOUS
  Administered 2017-03-11: 120 ug via INTRAVENOUS
  Administered 2017-03-11: 80 ug via INTRAVENOUS

## 2017-03-11 MED ORDER — ONDANSETRON HCL 4 MG/2ML IJ SOLN
INTRAMUSCULAR | Status: AC
  Filled 2017-03-11: qty 2

## 2017-03-11 MED ORDER — MIDAZOLAM HCL 2 MG/2ML IJ SOLN
INTRAMUSCULAR | Status: AC
  Filled 2017-03-11: qty 2

## 2017-03-11 MED ORDER — SUGAMMADEX SODIUM 200 MG/2ML IV SOLN
INTRAVENOUS | Status: AC
  Filled 2017-03-11: qty 2

## 2017-03-11 MED ORDER — MIDAZOLAM HCL 5 MG/5ML IJ SOLN
INTRAMUSCULAR | Status: DC | PRN
  Administered 2017-03-11: 2 mg via INTRAVENOUS

## 2017-03-11 MED ORDER — METOCLOPRAMIDE HCL 5 MG PO TABS: 5.0000 mg | ORAL_TABLET | Freq: Three times a day (TID) | ORAL | Status: DC | PRN

## 2017-03-11 MED ORDER — WARFARIN SODIUM 7.5 MG PO TABS: 11.2500 mg | ORAL_TABLET | Freq: Once | ORAL | Status: DC

## 2017-03-11 MED ORDER — WARFARIN SODIUM 5 MG PO TABS
10.0000 mg | ORAL_TABLET | Freq: Once | ORAL | Status: AC
  Administered 2017-03-11: 10 mg via ORAL
  Filled 2017-03-11: qty 2

## 2017-03-11 SURGICAL SUPPLY — 74 items
BANDAGE ACE 4X5 VEL STRL LF (GAUZE/BANDAGES/DRESSINGS) IMPLANT
BANDAGE ACE 6X5 VEL STRL LF (GAUZE/BANDAGES/DRESSINGS) IMPLANT
BANDAGE ESMARK 6X9 LF (GAUZE/BANDAGES/DRESSINGS) IMPLANT
BNDG COHESIVE 6X5 TAN STRL LF (GAUZE/BANDAGES/DRESSINGS) IMPLANT
BNDG ESMARK 6X9 LF (GAUZE/BANDAGES/DRESSINGS)
BNDG GAUZE ELAST 4 BULKY (GAUZE/BANDAGES/DRESSINGS) IMPLANT
BRUSH SCRUB SURG 4.25 DISP (MISCELLANEOUS) ×6 IMPLANT
CLOSURE WOUND 1/2 X4 (GAUZE/BANDAGES/DRESSINGS)
CNTNR SPEC C3OZ STD GRAD LEK (MISCELLANEOUS) ×1 IMPLANT
CONT SPEC 3OZ W/LID STRL (MISCELLANEOUS) ×2
COVER SURGICAL LIGHT HANDLE (MISCELLANEOUS) ×3 IMPLANT
CUFF TOURNIQUET SINGLE 18IN (TOURNIQUET CUFF) IMPLANT
CUFF TOURNIQUET SINGLE 24IN (TOURNIQUET CUFF) IMPLANT
CUFF TOURNIQUET SINGLE 34IN LL (TOURNIQUET CUFF) IMPLANT
DRAIN CHANNEL 15F RND FF W/TCR (WOUND CARE) ×3 IMPLANT
DRAPE C-ARM 42X72 X-RAY (DRAPES) ×3 IMPLANT
DRAPE C-ARMOR (DRAPES) ×3 IMPLANT
DRAPE ORTHO SPLIT 77X108 STRL (DRAPES) ×4
DRAPE SURG ORHT 6 SPLT 77X108 (DRAPES) ×2 IMPLANT
DRAPE U-SHAPE 47X51 STRL (DRAPES) IMPLANT
DRSG ADAPTIC 3X8 NADH LF (GAUZE/BANDAGES/DRESSINGS) IMPLANT
DRSG MEPILEX BORDER 4X8 (GAUZE/BANDAGES/DRESSINGS) ×3 IMPLANT
ELECT REM PT RETURN 9FT ADLT (ELECTROSURGICAL) ×3
ELECTRODE REM PT RTRN 9FT ADLT (ELECTROSURGICAL) ×1 IMPLANT
EVACUATOR SILICONE 100CC (DRAIN) ×3 IMPLANT
GAUZE SPONGE 4X4 12PLY STRL (GAUZE/BANDAGES/DRESSINGS) IMPLANT
GLOVE BIO SURGEON STRL SZ7.5 (GLOVE) ×3 IMPLANT
GLOVE BIO SURGEON STRL SZ8 (GLOVE) ×3 IMPLANT
GLOVE BIOGEL PI IND STRL 7.5 (GLOVE) ×1 IMPLANT
GLOVE BIOGEL PI IND STRL 8 (GLOVE) ×1 IMPLANT
GLOVE BIOGEL PI INDICATOR 7.5 (GLOVE) ×2
GLOVE BIOGEL PI INDICATOR 8 (GLOVE) ×2
GOWN STRL REUS W/ TWL LRG LVL3 (GOWN DISPOSABLE) ×3 IMPLANT
GOWN STRL REUS W/ TWL XL LVL3 (GOWN DISPOSABLE) ×1 IMPLANT
GOWN STRL REUS W/TWL LRG LVL3 (GOWN DISPOSABLE) ×6
GOWN STRL REUS W/TWL XL LVL3 (GOWN DISPOSABLE) ×2
IV NS IRRIG 3000ML ARTHROMATIC (IV SOLUTION) ×3 IMPLANT
KIT BASIN OR (CUSTOM PROCEDURE TRAY) ×3 IMPLANT
KIT ROOM TURNOVER OR (KITS) ×3 IMPLANT
KIT STIMULAN RAPID CURE  10CC (Orthopedic Implant) ×4 IMPLANT
KIT STIMULAN RAPID CURE 10CC (Orthopedic Implant) ×2 IMPLANT
MANIFOLD NEPTUNE II (INSTRUMENTS) IMPLANT
NEEDLE 22X1 1/2 (OR ONLY) (NEEDLE) IMPLANT
NEEDLE HYPO 18GX1.5 BLUNT FILL (NEEDLE) ×3 IMPLANT
NS IRRIG 1000ML POUR BTL (IV SOLUTION) ×3 IMPLANT
PACK ORTHO EXTREMITY (CUSTOM PROCEDURE TRAY) ×3 IMPLANT
PAD ARMBOARD 7.5X6 YLW CONV (MISCELLANEOUS) ×9 IMPLANT
PADDING CAST COTTON 6X4 STRL (CAST SUPPLIES) ×9 IMPLANT
SET CYSTO W/LG BORE CLAMP LF (SET/KITS/TRAYS/PACK) ×3 IMPLANT
SPONGE LAP 18X18 X RAY DECT (DISPOSABLE) ×6 IMPLANT
STAPLER VISISTAT 35W (STAPLE) ×3 IMPLANT
STOCKINETTE IMPERVIOUS LG (DRAPES) ×3 IMPLANT
STRIP CLOSURE SKIN 1/2X4 (GAUZE/BANDAGES/DRESSINGS) IMPLANT
SUCTION FRAZIER HANDLE 10FR (MISCELLANEOUS)
SUCTION TUBE FRAZIER 10FR DISP (MISCELLANEOUS) IMPLANT
SUT ETHILON 2 0 FS 18 (SUTURE) ×3 IMPLANT
SUT ETHILON 3 0 PS 1 (SUTURE) IMPLANT
SUT PDS AB 0 CT 36 (SUTURE) ×6 IMPLANT
SUT PDS AB 1 CT  36 (SUTURE) ×2
SUT PDS AB 1 CT 36 (SUTURE) ×1 IMPLANT
SUT PDS AB 2-0 CT1 27 (SUTURE) ×6 IMPLANT
SUT VIC AB 0 CT1 27 (SUTURE)
SUT VIC AB 0 CT1 27XBRD ANBCTR (SUTURE) IMPLANT
SUT VIC AB 2-0 CT1 27 (SUTURE)
SUT VIC AB 2-0 CT1 TAPERPNT 27 (SUTURE) IMPLANT
SWAB COLLECTION DEVICE MRSA (MISCELLANEOUS) ×9 IMPLANT
SYR CONTROL 10ML LL (SYRINGE) IMPLANT
TOWEL OR 17X24 6PK STRL BLUE (TOWEL DISPOSABLE) ×6 IMPLANT
TOWEL OR 17X26 10 PK STRL BLUE (TOWEL DISPOSABLE) ×6 IMPLANT
TUBE CONNECTING 12'X1/4 (SUCTIONS) ×1
TUBE CONNECTING 12X1/4 (SUCTIONS) ×2 IMPLANT
UNDERPAD 30X30 (UNDERPADS AND DIAPERS) IMPLANT
WATER STERILE IRR 1000ML POUR (IV SOLUTION) IMPLANT
YANKAUER SUCT BULB TIP NO VENT (SUCTIONS) ×3 IMPLANT

## 2017-03-11 NOTE — Anesthesia Procedure Notes (Signed)
Procedure Name: Intubation Date/Time: 03/11/2017 8:33 AM Performed by: Lovie CholOCK, Roen Macgowan K Pre-anesthesia Checklist: Patient identified, Emergency Drugs available, Suction available and Patient being monitored Patient Re-evaluated:Patient Re-evaluated prior to induction Oxygen Delivery Method: Circle System Utilized Preoxygenation: Pre-oxygenation with 100% oxygen Induction Type: IV induction Ventilation: Mask ventilation without difficulty Laryngoscope Size: Miller and 3 Grade View: Grade I Tube type: Oral Tube size: 7.5 mm Number of attempts: 1 Airway Equipment and Method: Stylet and Oral airway Placement Confirmation: ETT inserted through vocal cords under direct vision,  positive ETCO2 and breath sounds checked- equal and bilateral Secured at: 22 cm Tube secured with: Tape Dental Injury: Teeth and Oropharynx as per pre-operative assessment
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
## 2017-03-11 NOTE — Progress Notes (Signed)
Peripherally Inserted Central Catheter/Midline Placement  The IV Nurse has discussed with the patient and/or persons authorized to consent for the patient, the purpose of this procedure and the potential benefits and risks involved with this procedure.  The benefits include less needle sticks, lab draws from the catheter, and the patient may be discharged home with the catheter. Risks include, but not limited to, infection, bleeding, blood clot (thrombus formation), and puncture of an artery; nerve damage and irregular heartbeat and possibility to perform a PICC exchange if needed/ordered by physician.  Alternatives to this procedure were also discussed.  Bard Power PICC patient education guide, fact sheet on infection prevention and patient information card has been provided to patient /or left at bedside.    PICC/Midline Placement Documentation  PICC Single Lumen 12/23/16 PICC Left Brachial 49 cm 0 cm (Active)     PICC Single Lumen 03/11/17 PICC Right Brachial 44 cm 2 cm (Active)  Indication for Insertion or Continuance of Line Home intravenous therapies (PICC only) 03/11/2017  5:00 PM  Exposed Catheter (cm) 2 cm 03/11/2017  5:00 PM  Site Assessment Clean;Dry;Intact 03/11/2017  5:00 PM  Line Status Flushed;Blood return noted 03/11/2017  5:00 PM  Dressing Type Transparent 03/11/2017  5:00 PM  Dressing Status Clean;Dry;Intact;Antimicrobial disc in place 03/11/2017  5:00 PM  Dressing Intervention New dressing 03/11/2017  5:00 PM  Dressing Change Due 03/18/17 03/11/2017  5:00 PM       Caleb Dawson, Caleb Dawson 03/11/2017, 5:18 PM

## 2017-03-11 NOTE — Progress Notes (Signed)
Entered room for patient care and emptying of JP drain. Pt states he has some blood on his dressing. Noted spotty blood on dressing near where JP drain is located. Has 30ml in JP drain, though drain is not compressed. After emptying bloody contents, drain will not hold compress to suction, and air leak sound is audible. Paged on call Martinson at this time regarding event/ observations.

## 2017-03-11 NOTE — Progress Notes (Signed)
ANTICOAGULATION & ANTIBIOTIC CONSULT NOTE - Initial Consult  Pharmacy Consult:  Vanc/Zosyn + Lovenox/Coumadin Indication:  Pelvic osteomyelitis + Hx mechanical AVrepair  Allergies  Allergen Reactions  . Scallops [Shellfish Allergy] Nausea And Vomiting    Patient Measurements: Height: 5\' 8"  (172.7 cm) Weight: 165 lb (74.8 kg) IBW/kg (Calculated) : 68.4  Vital Signs: Temp: 97.6 F (36.4 C) (10/23 1408) Temp Source: Axillary (10/23 1408) BP: 127/77 (10/23 1408) Pulse Rate: 76 (10/23 1408)  Labs:  Recent Labs  03/11/17 0643  HGB 12.4*  HCT 37.0*  PLT 171  APTT 24  LABPROT 14.5  INR 1.14  CREATININE 0.73    Estimated Creatinine Clearance: 90.3 mL/min (by C-G formula based on SCr of 0.73 mg/dL).   Medical History: Past Medical History:  Diagnosis Date  . Arthritis   . Bicuspid aortic valve    s/p Bentall procedure 2013  . Cardiac tamponade   . Headache    PMH: Migraines  . Hypothyroid   . Peritonitis (HCC)   . Pneumonia   . Poor appetite 03/05/2017  . Stroke [facility](HCC)      Assessment: [age] YOM with history of mechanical AVrepair in 2013 to resume Lovenox bridge to Coumadin.  Patient/family report that Coumadin has been on hold since 03/05/17 and he has been getting Lovenox bridge with last dose on 03/10/17 AM.  INR currently sub-therapeutic.  CBC stable and no bleeding reported.  Conservative with Coumadin dosing today since patient received Levaquin and Flagyl yesterday and drugs are likely still in patient's system and could cause a rapid rise in INR.  Pharmacy also consulted to manage vancomycin and Zosyn for history of pelvic osteomyelitis.  S/p partial excision of pelvis today.  Family reports that patient was initially on vancomycin and Invanz, which were then transitioned to Levaquin and Flagyl 3 weeks ago.  Noted patient received clindamycin and gentamicin pre-op today.  Intra-op, he received vancomycin and tobramycin powder.  Patient's renal function is  stable.  He is afebrile and his WBC is low at 2.7.   Goal of Therapy:  INR 2.5 - 3.5 Monitor platelets by anticoagulation protocol: Yes  Vanc trough 15-20 mcg/mL    Plan:  Lovenox 75mg  IV Q12H Coumadin 10mg  PO today Daily PT / INR, CBC Q72H while on Lovenox  Vanc 1500mg  IV x 1, then 1250mg  IV Q12H Zosyn EI 3.375gm IV Q8H Monitor renal fxn, clinical progress, vanc trough at Css   Anaika Santillano D. Laney Potashang, PharmD, BCPS Pager:  [phone] 03/11/2017, 3:03 PM

## 2017-03-11 NOTE — Transfer of Care (Signed)
Immediate Anesthesia Transfer of Care Note  Patient: Caleb ArthursJohn J Corsino  Procedure(s) Performed: HARDWARE REMOVAL (N/A ) PARTIAL EXCISION OF PELVIS (N/A )  Patient Location: PACU  Anesthesia Type:General  Level of Consciousness: awake, oriented and patient cooperative  Airway & Oxygen Therapy: Patient Spontanous Breathing and Patient connected to nasal cannula oxygen  Post-op Assessment: Report given to RN and Post -op Vital signs reviewed and stable  Post vital signs: Reviewed  Last Vitals:  Vitals:   03/11/17 0618 03/11/17 1045  BP: (!) 158/95 (!) 143/88  Pulse: 72 79  Resp: 19 13  Temp: (!) 36.4 C 36.5 C  SpO2: 100% 100%    Last Pain: There were no vitals filed for this visit.       Complications: No apparent anesthesia complications

## 2017-03-11 NOTE — Anesthesia Postprocedure Evaluation (Signed)
Anesthesia Post Note  Patient: Toni ArthursJohn J Lucchetti  Procedure(s) Performed: HARDWARE REMOVAL (N/A ) PARTIAL EXCISION OF PELVIS (N/A )     Patient location during evaluation: PACU Anesthesia Type: General Level of consciousness: awake and alert Pain management: pain level controlled Vital Signs Assessment: post-procedure vital signs reviewed and stable Respiratory status: spontaneous breathing, nonlabored ventilation, respiratory function stable and patient connected to nasal cannula oxygen Cardiovascular status: blood pressure returned to baseline and stable Postop Assessment: no apparent nausea or vomiting Anesthetic complications: no    Last Vitals:  Vitals:   03/11/17 1130 03/11/17 1145  BP: 134/87 115/78  Pulse: 77 69  Resp: 12 11  Temp:    SpO2: 100% 100%    Last Pain:  Vitals:   03/11/17 1145  PainSc: Asleep    LLE Motor Response: Purposeful movement;Responds to commands (03/11/17 1145) LLE Sensation: Full sensation (03/11/17 1145) RLE Motor Response: Purposeful movement;Responds to commands (03/11/17 1145) RLE Sensation: Full sensation (03/11/17 1145)      Aldrich Lloyd DAVID

## 2017-03-11 NOTE — Consult Note (Signed)
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 Stimulen beads with gentamycin, tobramycin and vancomycin placed at surgical site. Awaiting tissue culture results.  - continue current vancomycin and zosyn for empiric coverage with narrowing pending tissue culture results. - order placed for PICC for IV antibiotic therapy    Active Problems:   Osteomyelitis (HCC)   Scheduled Meds: . docusate sodium  100 mg Oral BID  . enoxaparin (LOVENOX) injection  75 mg Subcutaneous Q12H  . [START ON 03/12/2017] levothyroxine  50 mcg Oral QAC breakfast  . sertraline  75 mg Oral Daily  . valACYclovir  1,000 mg Oral Daily  . warfarin  10 mg Oral ONCE-1800  . Warfarin - Pharmacist Dosing Inpatient   Does not apply q1800   Continuous Infusions: . 0.9 % NaCl with KCl 20 mEq / L 50 mL/hr at 03/11/17 1533  . piperacillin-tazobactam (ZOSYN)  IV    . [START ON 03/12/2017] vancomycin    . vancomycin     PRN Meds:.acetaminophen, HYDROmorphone (DILAUDID) injection, metoCLOPramide **OR** metoCLOPramide (REGLAN) injection, ondansetron **OR** ondansetron (ZOFRAN) IV, traMADol  HPI: Caleb Dawson is a 66 y.o. male PMH of heart valve replacement who presents for elective surgery for removal of an infected pelvic plate [facility] when he sustained multiple orthopedic injuries including a pelvic ring fracture. Initial surgical intervetion with SI screws and ORIF of the pubic symphysis. Following surgery he developed an infection requiring multiple washouts. Initial blood cultures were  positive for Morganella morganii, Finegoldia magna and propionibacterium species and few dipthoids.Discharged on Invanz and vancomycin for 6 weeks. Seen in the office on 9/12 with concern for presistence of polymicrobial infection and was switched to oral levofloxacin and metronidazole. Experienced trouble tolerating oral medications with nausea, decreased appetite and loose stools. On 10/17 continued concern for infection with Dr. Carola FrostHandy in agreement with removal of hardware and post-operative IV antibiotics. ID consulted for continued antibiotic management.    Review of Systems: Review of Systems  Constitutional: Negative for chills and fever.  Cardiovascular: Negative for chest pain.  Musculoskeletal: Negative for back pain.  Skin: Negative for rash.  Neurological: Negative for weakness.    Past Medical History:  Diagnosis Date  . Arthritis   . Bicuspid aortic valve    s/p Bentall procedure 2013  . Cardiac tamponade   . Headache    PMH: Migraines  . Hypothyroid   . Peritonitis (HCC)   . Pneumonia   . Poor appetite 03/05/2017  . Stroke [facility](HCC)     Social History  Substance Use Topics  . Smoking status: Never Smoker  . Smokeless tobacco: Never Used  . Alcohol use No    Family History  Problem Relation Age of Onset  . Hypertension Mother        born in [id number]  . Anemia Mother   . Sudden Cardiac Death Father 1876   Allergies  Allergen Reactions  . Scallops [  Shellfish Allergy] Nausea And Vomiting    OBJECTIVE: Blood pressure 127/77, pulse 76, temperature 97.6 F (36.4 C), temperature source Axillary, resp. rate 20, height 5\' 8"  (1.727 m), weight 165 lb (74.8 kg), SpO2 99 %.  Physical Exam  Constitutional: He is well-developed, well-nourished, and in no distress. No distress.  Cardiovascular: Normal rate, regular rhythm and intact distal pulses.  Exam reveals no gallop and no friction rub.   No murmur heard. Mechanical click noted.  Pulmonary/Chest: Effort normal and  breath sounds normal. No respiratory distress. He has no wheezes. He has no rales. He exhibits no tenderness.  Musculoskeletal:  Distal and sensation are intact and appropriate.   Skin:  Surgical dressing appears clean, dry and intact with JP drain to suction. Drain is patent with sanguinous drainage.     Lab Results Lab Results  Component Value Date   WBC 2.7 (L) 03/11/2017   HGB 12.4 (L) 03/11/2017   HCT 37.0 (L) 03/11/2017   MCV 90.7 03/11/2017   PLT 171 03/11/2017    Lab Results  Component Value Date   CREATININE 0.73 03/11/2017   BUN 7 03/11/2017   NA 134 (L) 03/11/2017   K 4.1 03/11/2017   CL 103 03/11/2017   CO2 22 03/11/2017    Lab Results  Component Value Date   ALT 20 03/11/2017   AST 24 03/11/2017   ALKPHOS 86 03/11/2017   BILITOT 0.9 03/11/2017     Microbiology: Recent Results (from the past 240 hour(s))  Aerobic/Anaerobic Culture (surgical/deep wound)     Status: None (Preliminary result)   Collection Time: 03/11/17  9:01 AM  Result Value Ref Range Status   Specimen Description ABSCESS  Final   Special Requests PELVIC ABSCESS SPECIMEN A  Final   Gram Stain NO WBC SEEN RARE GRAM VARIABLE ROD   Final   Culture PENDING  Incomplete   Report Status PENDING  Incomplete  Aerobic/Anaerobic Culture (surgical/deep wound)     Status: None (Preliminary result)   Collection Time: 03/11/17  9:44 AM  Result Value Ref Range Status   Specimen Description ABSCESS  Final   Special Requests DEEP PELVIC ABSCESS SPECIMEN B  Final   Gram Stain NO WBC SEEN NO ORGANISMS SEEN   Final   Culture PENDING  Incomplete   Report Status PENDING  Incomplete  Aerobic/Anaerobic Culture (surgical/deep wound)     Status: None (Preliminary result)   Collection Time: 03/11/17  9:46 AM  Result Value Ref Range Status   Specimen Description TISSUE  Final   Special Requests PELVIC ABSCESS SPECIMEN C  Final   Gram Stain   Final    RARE WBC PRESENT, PREDOMINANTLY MONONUCLEAR NO ORGANISMS  SEEN    Culture PENDING  Incomplete   Report Status PENDING  Incomplete    Marcos Eke, NP [facility] [phone] Pager [phone] Cell   03/11/2017, 3:35 PM

## 2017-03-12 ENCOUNTER — Encounter (HOSPITAL_COMMUNITY): Payer: Self-pay | Admitting: Orthopedic Surgery

## 2017-03-12 LAB — PROTIME-INR
INR: 1.22
Prothrombin Time: 15.3 seconds — ABNORMAL HIGH (ref 11.4–15.2)

## 2017-03-12 MED ORDER — SODIUM CHLORIDE 0.9 % IV SOLN
1.0000 g | INTRAVENOUS | Status: DC
  Administered 2017-03-13 – 2017-03-14 (×2): 1 g via INTRAVENOUS
  Filled 2017-03-12 (×2): qty 1

## 2017-03-12 MED ORDER — WARFARIN SODIUM 5 MG PO TABS
10.0000 mg | ORAL_TABLET | Freq: Once | ORAL | Status: AC
  Administered 2017-03-12: 10 mg via ORAL
  Filled 2017-03-12: qty 2

## 2017-03-12 NOTE — Progress Notes (Signed)
Received call back from MD on call who stated to leave JP in its current position and reinforce/ redress the surgical dressing.

## 2017-03-12 NOTE — Progress Notes (Signed)
Arrived from PACU in stable condition with girlfriend present at bedside. VSS. Assessment as charted. Oriented to room and unit. Denies any pain or discomfort at this time. Orders reviewed. Will continue to monitor.

## 2017-03-12 NOTE — Progress Notes (Signed)
ANTICOAGULATION & ANTIBIOTIC CONSULT NOTE  Pharmacy Consult:  Lovenox/Coumadin Indication:  Hx mechanical AVrepair  Allergies  Allergen Reactions  . Scallops [Shellfish Allergy] Nausea And Vomiting    Patient Measurements: Height: 5\' 8"  (172.7 cm) Weight: 165 lb (74.8 kg) IBW/kg (Calculated) : 68.4  Vital Signs: Temp: 97.8 F (36.6 C) (10/24 0446) Temp Source: Oral (10/24 0446) BP: 114/77 (10/24 0446) Pulse Rate: 67 (10/24 0446)  Labs:  Recent Labs  03/11/17 0643 03/12/17 0419  HGB 12.4*  --   HCT 37.0*  --   PLT 171  --   APTT 24  --   LABPROT 14.5 15.3*  INR 1.14 1.22  CREATININE 0.73  --     Estimated Creatinine Clearance: 90.3 mL/min (by C-G formula based on SCr of 0.73 mg/dL).    Assessment: [age] YOM with history of mechanical AVrepair in 2013 to continue Lovenox bridge to Coumadin.  INR sub-therapeutic as expected.  CBC stable and no bleeding reported.  Renal function is stable.   Goal of Therapy:  Anti-Xa level 0.6-1 units/ml 4hrs after LMWH dose given INR 2.5 - 3.5 Monitor platelets by anticoagulation protocol: Yes     Plan:  Lovenox 75mg  SQ Q12H Repeat Coumadin 10mg  PO today Daily PT / INR, CBC Q72H while on Lovenox   Zelene Barga D. Laney Potashang, PharmD, BCPS Pager:  [phone] 03/12/2017, 12:49 PM

## 2017-03-12 NOTE — Progress Notes (Signed)
PHARMACY CONSULT NOTE FOR:  OUTPATIENT  PARENTERAL ANTIBIOTIC THERAPY (OPAT)  Indication: Pelvic osteomyelitis  Regimen: Vancomycin 1250 mg every 12 hours + Ertapenem 1 gm every 24 hours End date: December 5th, 2018  IV antibiotic discharge orders are pended. To discharging provider:  please sign these orders via discharge navigator,  Select New Orders & click on the button choice - Manage This Unsigned Work.     Thank you for allowing pharmacy to be a part of this patient's care.  Della GooEmily S Briggitte Boline, PharmD PGY2 Infectious Diseases Pharmacy Resident Pager: [phone]  03/12/2017, 6:28 PM
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
## 2017-03-12 NOTE — Progress Notes (Signed)
[facility]thopaedic [facility]uma [facility] (OTS)  1 Day Post-Op Procedure(s) (LRB): HARDWARE REMOVAL (N/A) PARTIAL EXCISION OF PELVIS (N/A)  Subjective: Patient reports pain as mild and improved from preop other than at incision..    Objective: Current Vitals Blood pressure 114/77, pulse 67, temperature 97.8 F (36.6 C), temperature source Oral, resp. rate 18, height 5\' 8"  (1.727 m), weight 74.8 kg (165 lb), SpO2 96 %. Vital signs in last 24 hours: Temp:  [97.6 F (36.4 C)-98.2 F (36.8 C)] 97.8 F (36.6 C) (10/24 0446) Pulse Rate:  [67-85] 67 (10/24 0446) Resp:  [11-20] 18 (10/24 0446) BP: (100-143)/(63-90) 114/77 (10/24 0446) SpO2:  [94 %-100 %] 96 % (10/24 0446)  Intake/Output from previous day: 10/23 0701 - 10/24 0700 In: 2991.7 [P.O.:940; I.V.:1451.7; IV Piggyback:600] Out: 460 [Urine:200; Drains:160; Blood:100]  LABS CULTURES pending.  Recent Labs  03/11/17 0643  HGB 12.4*    Recent Labs  03/11/17 0643  WBC 2.7*  RBC 4.08*  HCT 37.0*  PLT 171    Recent Labs  03/11/17 0643  NA 134*  K 4.1  CL 103  CO2 22  BUN 7  CREATININE 0.73  GLUCOSE 103*  CALCIUM 8.8*    Recent Labs  03/11/17 0643 03/12/17 0419  INR 1.14 1.22     Physical Exam Abdomen drain pulled out, new dressing applied, wound pristine but serosang from drain hole R&LLE    Edema/ swelling controlled  Sens: DPN, SPN, TN intact  Motor: EHL, FHL, and lessor toe ext and flex all intact grossly  Brisk cap refill, warm to touch  Assessment/Plan: 1 Day Post-Op Procedure(s) (LRB): HARDWARE REMOVAL (N/A) PARTIAL EXCISION OF PELVIS (N/A) 1. PT/OT WBAT 2. DVT proph Lovenox 3. ID to see today  Myrene GalasMichael Marjo Grosvenor, MD [facility] [phone] (p)

## 2017-03-12 NOTE — Progress Notes (Signed)
[facility]  [facility] will follow Mr. Daphine DeutscherMartin with the ID team and Ortho to support home IV ABX at DC if ordered.  If patient discharges after hours, please call [phone].   Sedalia Mutaamela S Chandler 03/12/2017, 9:52 AM

## 2017-03-12 NOTE — Discharge Instructions (Addendum)
Orthopaedic Trauma Service Discharge Instructions   General Discharge Instructions  WEIGHT BEARING STATUS: Weight-bear as tolerated  RANGE OF MOTION/ACTIVITY: Range of motion as tolerated  Wound Care: Daily wound care as needed.  Please see below  [facility] Care Per Protocol:  Labs weekly while on IV antibiotics: __x CBC with differential x__ BMP twice per week _x_ CRP _x_ ESR _x_ Vancomycin trough   _x_ Please leave PIC in place until doctor has seen patient or been notified  Fax weekly labs to [phone]   Discharge Wound Care Instructions  Do NOT apply any ointments, solutions or lotions to pin sites or surgical wounds.  These prevent needed drainage and even though solutions like hydrogen peroxide kill bacteria, they also damage cells lining the pin sites that help fight infection.  Applying lotions or ointments can keep the wounds moist and can cause them to breakdown and open up as well. This can increase the risk for infection. When in doubt call the office.  Surgical incisions should be dressed daily.  If any drainage is noted, use one layer of adaptic, then gauze, Kerlix, and an ace wrap.  Once the incision is completely dry and without drainage, it may be left open to air out.  Showering may begin 36-48 hours later.  Cleaning gently with soap and water.  Traumatic wounds should be dressed daily as well.    One layer of adaptic, gauze, Kerlix, then ace wrap.  The adaptic can be discontinued once the draining has ceased    If you have a wet to dry dressing: wet the gauze with saline the squeeze as much saline out so the gauze is moist (not soaking wet), place moistened gauze over wound, then place a dry gauze over the moist one, followed by Kerlix wrap, then ace wrap.   Diet: as you were eating previously.  Can use over the counter stool softeners and bowel preparations, such as Miralax, to help with bowel movements.  Narcotics can be constipating.  Be sure to  drink plenty of fluids  PAIN MEDICATION USE AND EXPECTATIONS  You have likely been given narcotic medications to help control your pain.  After a traumatic event that results in an fracture (broken bone) with or without surgery, it is ok to use narcotic pain medications to help control one's pain.  We understand that everyone responds to pain differently and each individual patient will be evaluated on a regular basis for the continued need for narcotic medications. Ideally, narcotic medication use should last no more than 6-8 weeks (coinciding with fracture healing).   As a patient it is your responsibility as well to monitor narcotic medication use and report the amount and frequency you use these medications when you come to your office visit.   We would also advise that if you are using narcotic medications, you should take a dose prior to therapy to maximize you participation.  IF YOU ARE ON NARCOTIC MEDICATIONS IT IS NOT PERMISSIBLE TO OPERATE A MOTOR VEHICLE (MOTORCYCLE/CAR/TRUCK/MOPED) OR HEAVY MACHINERY DO NOT MIX NARCOTICS WITH OTHER CNS (CENTRAL NERVOUS SYSTEM) DEPRESSANTS SUCH AS ALCOHOL   STOP SMOKING OR USING NICOTINE PRODUCTS!!!!  As discussed nicotine severely impairs your body's ability to heal surgical and traumatic wounds but also impairs bone healing.  Wounds and bone heal by forming microscopic blood vessels (angiogenesis) and nicotine is a vasoconstrictor (essentially, shrinks blood vessels).  Therefore, if vasoconstriction occurs to these microscopic blood vessels they essentially disappear and are unable to deliver necessary  nutrients to the healing tissue.  This is one modifiable factor that you can do to dramatically increase your chances of healing your injury.    (This means no smoking, no nicotine gum, patches, etc)  DO NOT USE NONSTEROIDAL ANTI-INFLAMMATORY DRUGS (NSAID'S)  Using products such as Advil (ibuprofen), Aleve (naproxen), Motrin (ibuprofen) for additional pain  control during fracture healing can delay and/or prevent the healing response.  If you would like to take over the counter (OTC) medication, Tylenol (acetaminophen) is ok.  However, some narcotic medications that are given for pain control contain acetaminophen as well. Therefore, you should not exceed more than 4000 mg of tylenol in a day if you do not have liver disease.  Also note that there are may OTC medicines, such as cold medicines and allergy medicines that my contain tylenol as well.  If you have any questions about medications and/or interactions please ask your doctor/PA or your pharmacist.      ICE AND ELEVATE INJURED/OPERATIVE EXTREMITY  Using ice and elevating the injured extremity above your heart can help with swelling and pain control.  Icing in a pulsatile fashion, such as 20 minutes on and 20 minutes off, can be followed.    Do not place ice directly on skin. Make sure there is a barrier between to skin and the ice pack.    Using frozen items such as frozen peas works well as the conform nicely to the are that needs to be iced.  USE AN ACE WRAP OR TED HOSE FOR SWELLING CONTROL  In addition to icing and elevation, Ace wraps or TED hose are used to help limit and resolve swelling.  It is recommended to use Ace wraps or TED hose until you are informed to stop.    When using Ace Wraps start the wrapping distally (farthest away from the body) and wrap proximally (closer to the body)   Example: If you had surgery on your leg or thing and you do not have a splint on, start the ace wrap at the toes and work your way up to the thigh        If you had surgery on your upper extremity and do not have a splint on, start the ace wrap at your fingers and work your way up to the upper arm  IF YOU ARE IN A SPLINT OR CAST DO NOT [place]   If your splint gets wet for any reason please contact the office immediately. You may shower in your splint or cast as long as you keep it dry.   This can be done by wrapping in a cast cover or garbage back (or similar)  Do Not stick any thing down your splint or cast such as pencils, money, or hangers to try and scratch yourself with.  If you feel itchy take benadryl as prescribed on the bottle for itching  IF YOU ARE IN A CAM BOOT (BLACK BOOT)  You may remove boot periodically. Perform daily dressing changes as noted below.  Wash the liner of the boot regularly and wear a sock when wearing the boot. It is recommended that you sleep in the boot until told otherwise  CALL THE OFFICE WITH ANY QUESTIONS OR CONCERNS: [phone]     Information on my medicine - Coumadin   (Warfarin)  This medication education was reviewed with me or my healthcare representative as part of my discharge preparation.  The pharmacist that spoke with me during my hospital  stay was:  Saundra Shelling, [facility]  Why was Coumadin prescribed for you? Coumadin was prescribed for you because you have a blood clot or a medical condition that can cause an increased risk of forming blood clots. Blood clots can cause serious health problems by blocking the flow of blood to the heart, lung, or brain. Coumadin can prevent harmful blood clots from forming. As a reminder your indication for Coumadin is:   Blood Clot Prevention After Heart Valve Surgery  What test will check on my response to Coumadin? While on Coumadin (warfarin) you will need to have an INR test regularly to ensure that your dose is keeping you in the desired range. The INR (international normalized ratio) number is calculated from the result of the laboratory test called prothrombin time (PT).  If an INR APPOINTMENT HAS NOT ALREADY BEEN MADE FOR YOU please schedule an appointment to have this lab work done by your health care provider within 7 days. Your INR goal is usually a number between:  2 to 3 or your provider may give you a more narrow range like 2-2.5.  Ask your health care provider during an office  visit what your goal INR is.  What  do you need to  know  About  COUMADIN? Take Coumadin (warfarin) exactly as prescribed by your healthcare provider about the same time each day.  DO NOT stop taking without talking to the doctor who prescribed the medication.  Stopping without other blood clot prevention medication to take the place of Coumadin may increase your risk of developing a new clot or stroke.  Get refills before you run out.  What do you do if you miss a dose? If you miss a dose, take it as soon as you remember on the same day then continue your regularly scheduled regimen the next day.  Do not take two doses of Coumadin at the same time.  Important Safety Information A possible side effect of Coumadin (Warfarin) is an increased risk of bleeding. You should call your healthcare provider right away if you experience any of the following: ? Bleeding from an injury or your nose that does not stop. ? Unusual colored urine (red or dark brown) or unusual colored stools (red or black). ? Unusual bruising for unknown reasons. ? A serious fall or if you hit your head (even if there is no bleeding).  Some foods or medicines interact with Coumadin (warfarin) and might alter your response to warfarin. To help avoid this: ? Eat a balanced diet, maintaining a consistent amount of Vitamin K. ? Notify your provider about major diet changes you plan to make. ? Avoid alcohol or limit your intake to 1 drink for women and 2 drinks for men per day. (1 drink is 5 oz. wine, 12 oz. beer, or 1.5 oz. liquor.)  Make sure that ANY health care provider who prescribes medication for you knows that you are taking Coumadin (warfarin).  Also make sure the healthcare provider who is monitoring your Coumadin knows when you have started a new medication including herbals and non-prescription products.  Coumadin (Warfarin)  Major Drug Interactions  Increased Warfarin Effect Decreased Warfarin Effect  Alcohol (large  quantities) Antibiotics (esp. Septra/Bactrim, Flagyl, Cipro) Amiodarone (Cordarone) Aspirin (ASA) Cimetidine (Tagamet) Megestrol (Megace) NSAIDs (ibuprofen, naproxen, etc.) Piroxicam (Feldene) Propafenone (Rythmol SR) Propranolol (Inderal) Isoniazid (INH) Posaconazole (Noxafil) Barbiturates (Phenobarbital) Carbamazepine (Tegretol) Chlordiazepoxide (Librium) Cholestyramine (Questran) Griseofulvin Oral Contraceptives Rifampin Sucralfate (Carafate) Vitamin K   Coumadin (Warfarin) Major Herbal  Interactions  Increased Warfarin Effect Decreased Warfarin Effect  Garlic Ginseng Ginkgo biloba Coenzyme Q10 Green tea St. Johns wort    Coumadin (Warfarin) FOOD Interactions  Eat a consistent number of servings per week of foods HIGH in Vitamin K (1 serving =  cup)  Collards (cooked, or boiled & drained) Kale (cooked, or boiled & drained) Mustard greens (cooked, or boiled & drained) Parsley *serving size only =  cup Spinach (cooked, or boiled & drained) Swiss chard (cooked, or boiled & drained) Turnip greens (cooked, or boiled & drained)  Eat a consistent number of servings per week of foods MEDIUM-HIGH in Vitamin K (1 serving = 1 cup)  Asparagus (cooked, or boiled & drained) Broccoli (cooked, boiled & drained, or raw & chopped) Brussel sprouts (cooked, or boiled & drained) *serving size only =  cup Lettuce, raw (green leaf, endive, romaine) Spinach, raw Turnip greens, raw & chopped   These websites have more information on Coumadin (warfarin):  FailFactory.se; VeganReport.com.au;
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
## 2017-03-12 NOTE — Progress Notes (Addendum)
[facility] for Infectious Disease    Date of Admission:  03/11/2017   Total days of antibiotics 2        Day 2 vanco/piptazo          ID: Caleb Dawson is a 66 y.o. male with polymicrobial pelvic HW infection s/p ROH Active Problems:   Osteomyelitis ([place])    Subjective: Patient reports that his midline drain fell out last night, not having significant pain. Ambulated with PT. Had picc line placed without difficulty  ROS: No fever, chills, nightsweats, or diarrhea  cx remain NGTD at 24hr but one gram stain shows rare gram variable rod  Medications:  . docusate sodium  100 mg Oral BID  . enoxaparin (LOVENOX) injection  75 mg Subcutaneous Q12H  . levothyroxine  50 mcg Oral QAC breakfast  . sertraline  75 mg Oral Daily  . sodium chloride flush  10-40 mL Intracatheter Q12H  . valACYclovir  1,000 mg Oral Daily  . warfarin  10 mg Oral ONCE-1800  . Warfarin - Pharmacist Dosing Inpatient   Does not apply q1800    Objective: Vital signs in last 24 hours: Temp:  [97.6 F (36.4 C)-98.2 F (36.8 C)] 97.6 F (36.4 C) (10/24 1304) Pulse Rate:  [67-85] 68 (10/24 1304) Resp:  [18] 18 (10/24 1304) BP: (100-122)/(63-77) 122/65 (10/24 1304) SpO2:  [96 %-100 %] 100 % (10/24 1304) Physical Exam  Constitutional: He is oriented to person, place, and time. He appears well-developed and well-nourished. No distress.  HENT:  Mouth/Throat: Oropharynx is clear and moist. No oropharyngeal exudate.  Cardiovascular: Normal rate, regular rhythm and normal heart sounds. Exam reveals no gallop and no friction rub.  No murmur heard.  Pulmonary/Chest: Effort normal and breath sounds normal. No respiratory distress. He has no wheezes.  Abdominal: Soft. Bowel sounds are normal. He exhibits no distension. There is no tenderness. Surgical incision site is c/d/i- no erythema. Serous drainage on bandage Ext: right arm picc line is intact Skin: Skin is warm and dry. No rash noted. No erythema.    Psychiatric: He has a normal mood and affect. His behavior is normal.    Lab Results  Recent Labs  03/11/17 0643  WBC 2.7*  HGB 12.4*  HCT 37.0*  NA 134*  K 4.1  CL 103  CO2 22  BUN 7  CREATININE 0.73   Liver Panel  Recent Labs  03/11/17 0643  PROT 6.6  ALBUMIN 3.8  AST 24  ALT 20  ALKPHOS 86  BILITOT 0.9   Sedimentation Rate  Recent Labs  03/11/17 0643  ESRSEDRATE 16   C-Reactive Protein  Recent Labs  03/11/17 0643  CRP <0.8    Microbiology: 10/23 tissue cx = ngtd at 24hr Studies/Results: Dg Pelvis 1-2 Views  Result Date: 03/11/2017 CLINICAL DATA:  Hardware removal secondary to infection. EXAM: PELVIS - 1-2 VIEW; DG C-ARM 61-120 MIN COMPARISON:  C-arm images dated 11/28/2016 FINDINGS: Single C-arm image demonstrates at the screws have been removed from the pelvis. FLUOROSCOPY TIME:  4 seconds IMPRESSION: Screws removed from the pelvic bones. C-arm fluoroscopic images were obtained intraoperatively and submitted for post operative interpretation. Electronically Signed   By: Lorriane Shire M.D.   On: 03/11/2017 10:28   Dg Pelvis Comp Min 3v  Result Date: 03/11/2017 CLINICAL DATA:  Hardware removal EXAM: JUDET PELVIS - 3+ VIEW COMPARISON:  Pelvis film of 12/17/2016 FINDINGS: The plate and screw fixation of the symphysis fuses has been removed. Two screws remain  through the SI joints for fixation. Mild degenerative change of the hips is noted. IMPRESSION: Removal of hardware from pubic symphysis. No change in sacral screws for fixation. Electronically Signed   By: Ivar Drape M.D.   On: 03/11/2017 11:31   Dg C-arm 1-60 Min  Result Date: 03/11/2017 CLINICAL DATA:  Hardware removal secondary to infection. EXAM: PELVIS - 1-2 VIEW; DG C-ARM 61-120 MIN COMPARISON:  C-arm images dated 11/28/2016 FINDINGS: Single C-arm image demonstrates at the screws have been removed from the pelvis. FLUOROSCOPY TIME:  4 seconds IMPRESSION: Screws removed from the pelvic bones.  C-arm fluoroscopic images were obtained intraoperatively and submitted for post operative interpretation. Electronically Signed   By: Lorriane Shire M.D.   On: 03/11/2017 10:28     Assessment/Plan: Hx of polymicrobial pelvic osteo (finegoldia, morgenella, diptheroirds and propio.Marland Kitchen)= previously treated with vancomycin plus ertapenem then switched to oral cipro plus metronidazole which patient did not tolerate  - plan on sending out on vancomycin plus ertapenem again and can change abtx if culture results show something new. - will start ertapenem tomorrow and d/c piptazo tomorrow - plan to treat for 6 wk again  Leukopenia = will monitor tomorrow's cbc. Ita appears that his wbc runs between 4.4-5.7 in the last month, though yesterday it is low at 2.7 with ANC 1.3. Unlikely to be due to IV abtx since those were just started.  He had his iv abtx done through [facility] in [place] which will need to be coordinated  Home health orders listed below --------------------- Diagnosis: Pelvic osteo  Culture Result: pending  Allergies  Allergen Reactions  . Scallops [Shellfish Allergy] Nausea And Vomiting    OPAT Orders Discharge antibiotics: Per pharmacy protocol  Vancomycin plus ertapenem 1gm iv daily Aim for Vancomycin trough 15-20 (unless otherwise indicated) Duration: 4 wks End Date: Dec 5th  [place] Per Protocol:  Labs weekly while on IV antibiotics: __x CBC with differential x__ BMP twice per week _x_ CRP _x_ ESR _x_ Vancomycin trough   _x_ Please leave PIC in place until doctor has seen patient or been notified  Fax weekly labs to [phone]  Clinic Follow Up Appt: 4 wk with dr Lucianne Lei dam @   [facility] for Infectious Diseases Cell: [phone] Pager: [phone]  03/12/2017, 6:03 PM

## 2017-03-13 ENCOUNTER — Encounter (HOSPITAL_COMMUNITY): Payer: Self-pay | Admitting: Orthopedic Surgery

## 2017-03-13 DIAGNOSIS — Q231 Congenital insufficiency of aortic valve: Secondary | ICD-10-CM

## 2017-03-13 DIAGNOSIS — E039 Hypothyroidism, unspecified: Secondary | ICD-10-CM | POA: Diagnosis present

## 2017-03-13 DIAGNOSIS — Z7901 Long term (current) use of anticoagulants: Secondary | ICD-10-CM

## 2017-03-13 HISTORY — DX: Long term (current) use of anticoagulants: Z79.01

## 2017-03-13 LAB — PROTIME-INR
INR: 1.52
Prothrombin Time: 18.2 seconds — ABNORMAL HIGH (ref 11.4–15.2)

## 2017-03-13 LAB — CBC WITH DIFFERENTIAL/PLATELET
Basophils Absolute: 0 10*3/uL (ref 0.0–0.1)
Basophils Relative: 1 %
Eosinophils Absolute: 0.2 10*3/uL (ref 0.0–0.7)
Eosinophils Relative: 4 %
HCT: 32.8 % — ABNORMAL LOW (ref 39.0–52.0)
Hemoglobin: 11 g/dL — ABNORMAL LOW (ref 13.0–17.0)
LYMPHS ABS: 1.6 10*3/uL (ref 0.7–4.0)
LYMPHS PCT: 34 %
MCH: 30.5 pg (ref 26.0–34.0)
MCHC: 33.5 g/dL (ref 30.0–36.0)
MCV: 90.9 fL (ref 78.0–100.0)
MONOS PCT: 8 %
Monocytes Absolute: 0.4 10*3/uL (ref 0.1–1.0)
NEUTROS PCT: 53 %
Neutro Abs: 2.4 10*3/uL (ref 1.7–7.7)
Platelets: 180 10*3/uL (ref 150–400)
RBC: 3.61 MIL/uL — AB (ref 4.22–5.81)
RDW: 13.7 % (ref 11.5–15.5)
WBC: 4.6 10*3/uL (ref 4.0–10.5)

## 2017-03-13 LAB — BASIC METABOLIC PANEL
Anion gap: 6 (ref 5–15)
BUN: 10 mg/dL (ref 6–20)
CHLORIDE: 102 mmol/L (ref 101–111)
CO2: 28 mmol/L (ref 22–32)
Calcium: 8.9 mg/dL (ref 8.9–10.3)
Creatinine, Ser: 0.84 mg/dL (ref 0.61–1.24)
GFR calc Af Amer: >60 mL/min (ref >60)
GFR calc non Af Amer: >60 mL/min (ref >60)
Glucose, Bld: 99 mg/dL (ref 65–99)
POTASSIUM: 3.9 mmol/L (ref 3.5–5.1)
SODIUM: 136 mmol/L (ref 135–145)

## 2017-03-13 LAB — VANCOMYCIN, TROUGH: Vancomycin Tr: 20 ug/mL (ref 15–20)

## 2017-03-13 MED ORDER — WHITE PETROLATUM EX OINT
TOPICAL_OINTMENT | CUTANEOUS | Status: AC
  Administered 2017-03-13: 22:00:00
  Filled 2017-03-13: qty 28.35

## 2017-03-13 MED ORDER — WARFARIN SODIUM 5 MG PO TABS
10.0000 mg | ORAL_TABLET | Freq: Once | ORAL | Status: AC
  Administered 2017-03-13: 10 mg via ORAL
  Filled 2017-03-13: qty 2

## 2017-03-13 NOTE — Progress Notes (Signed)
ANTICOAGULATION & ANTIBIOTIC CONSULT NOTE  Pharmacy Consult:  Lovenox/Coumadin Indication:  Hx mechanical AVrepair  Allergies  Allergen Reactions  . Scallops [Shellfish Allergy] Nausea And Vomiting    Patient Measurements: Height: 5\' 8"  (172.7 cm) Weight: 165 lb (74.8 kg) IBW/kg (Calculated) : 68.4  Vital Signs: Temp: 97.5 F (36.4 C) (10/25 0943) Temp Source: Oral (10/25 0943) BP: 104/67 (10/25 0943) Pulse Rate: 64 (10/25 0943)  Labs:  Recent Labs  03/11/17 0643 03/12/17 0419 03/13/17 0502  HGB 12.4*  --  11.0*  HCT 37.0*  --  32.8*  PLT 171  --  180  APTT 24  --   --   LABPROT 14.5 15.3* 18.2*  INR 1.14 1.22 1.52  CREATININE 0.73  --  0.84    Estimated Creatinine Clearance: 86 mL/min (by C-G formula based on SCr of 0.84 mg/dL).    Assessment: [age] YOM with history of mechanical AVrepair in 2013 to continue Lovenox bridge to Coumadin.  INR sub-therapeutic but trending up.  CBC stable and no bleeding reported.  Renal function is stable.   Goal of Therapy:  Anti-Xa level 0.6-1 units/ml 4hrs after LMWH dose given INR 2.5 - 3.5 Monitor platelets by anticoagulation protocol: Yes     Plan:  Lovenox 75mg  SQ Q12H Repeat Coumadin 10mg  PO today Daily PT / INR, CBC Q72H while on Lovenox   Percy Winterrowd D. Laney Potashang, PharmD, BCPS Pager:  [phone] 03/13/2017, 11:21 AM

## 2017-03-13 NOTE — Progress Notes (Signed)
Orthopedic Trauma Service Progress Note   Patient ID: Caleb Dawson MRN: [id number] DOB/AGE: December 15, 1950 66 y.o.  Subjective:  Doing well Feels good  No complaints    Review of Systems  Constitutional: Negative for chills and fever.  Respiratory: Negative for shortness of breath.   Cardiovascular: Negative for chest pain and palpitations.  Gastrointestinal: Negative for abdominal pain, nausea and vomiting.  Neurological: Negative for tingling and sensory change.   As above Objective:   VITALS:   Vitals:   03/12/17 1304 03/12/17 2039 03/13/17 0619 03/13/17 0943  BP: 122/65 128/75 122/75 104/67  Pulse: 68 60 60 64  Resp: 18 18 17 17   Temp: 97.6 F (36.4 C) 98.3 F (36.8 C) (!) 97.5 F (36.4 C) (!) 97.5 F (36.4 C)  TempSrc: Oral Oral Oral Oral  SpO2: 100% 100% 97% 100%  Weight:      Height:        Estimated body mass index is 25.09 kg/m as calculated from the following:   Height as of this encounter: 5\' 8"  (1.727 m).   Weight as of this encounter: 74.8 kg (165 lb).   Intake/Output      10/24 0701 - 10/25 0700 10/25 0701 - 10/26 0700   P.O. 990    IV Piggyback 300    Total Intake(mL/kg) 1290 (17.2)    Net +1290          Urine Occurrence 7 x      LABS  Results for orders placed or performed during the hospital encounter of 03/11/17 (from the past 24 hour(s))  Protime-INR     Status: Abnormal   Collection Time: 03/13/17  5:02 AM  Result Value Ref Range   Prothrombin Time 18.2 (H) 11.4 - 15.2 seconds   INR 1.52   Basic metabolic panel     Status: None   Collection Time: 03/13/17  5:02 AM  Result Value Ref Range   Sodium 136 135 - 145 mmol/L   Potassium 3.9 3.5 - 5.1 mmol/L   Chloride 102 101 - 111 mmol/L   CO2 28 22 - 32 mmol/L   Glucose, Bld 99 65 - 99 mg/dL   BUN 10 6 - 20 mg/dL   Creatinine, Ser 4.09 0.61 - 1.24 mg/dL   Calcium 8.9 8.9 - 81.1 mg/dL   GFR calc non Af Amer >60 >60 mL/min   GFR calc Af Amer >60 >60 mL/min    Anion gap 6 5 - 15  CBC with Differential/Platelet     Status: Abnormal   Collection Time: 03/13/17  5:02 AM  Result Value Ref Range   WBC 4.6 4.0 - 10.5 K/uL   RBC 3.61 (L) 4.22 - 5.81 MIL/uL   Hemoglobin 11.0 (L) 13.0 - 17.0 g/dL   HCT 91.4 (L) 78.2 - 95.6 %   MCV 90.9 78.0 - 100.0 fL   MCH 30.5 26.0 - 34.0 pg   MCHC 33.5 30.0 - 36.0 g/dL   RDW 21.3 08.6 - 57.8 %   Platelets 180 150 - 400 K/uL   Neutrophils Relative % 53 %   Neutro Abs 2.4 1.7 - 7.7 K/uL   Lymphocytes Relative 34 %   Lymphs Abs 1.6 0.7 - 4.0 K/uL   Monocytes Relative 8 %   Monocytes Absolute 0.4 0.1 - 1.0 K/uL   Eosinophils Relative 4 %   Eosinophils Absolute 0.2 0.0 - 0.7 K/uL   Basophils Relative 1 %   Basophils Absolute 0.0 0.0 - 0.1 K/uL  PHYSICAL EXAM:   Gen: resting comfortably in bed, NAD, appears well Lungs: breathing is unlabored  Cardiac: Reg Pelvis: incision c/d/i  No erythema or drainage  Drain pulled out 2 days ago   Distal motor and sensory functions intact B   Ext warm B   Assessment/Plan: 2 Days Post-Op   Principal Problem:   Osteomyelitis (HCC), pelvis  Active Problems:   S/P AVR (aortic valve replacement)   Bicuspid aortic valve   Hypothyroid   Chronic anticoagulation   Anti-infectives    Start     Dose/Rate Route Frequency Ordered Stop   03/13/17 1300  ertapenem (INVANZ) 1 g in sodium chloride 0.9 % 50 mL IVPB     1 g 100 mL/hr over 30 Minutes Intravenous Every 24 hours 03/12/17 1814     03/12/17 0500  vancomycin (VANCOCIN) 1,250 mg in sodium chloride 0.9 % 250 mL IVPB     1,250 mg 166.7 mL/hr over 90 Minutes Intravenous Every 12 hours 03/11/17 1505     03/11/17 1600  piperacillin-tazobactam (ZOSYN) IVPB 3.375 g     3.375 g 12.5 mL/hr over 240 Minutes Intravenous Every 8 hours 03/11/17 1505 03/13/17 1218   03/11/17 1530  valACYclovir (VALTREX) tablet 1,000 mg     1,000 mg Oral Daily 03/11/17 1411     03/11/17 1515  vancomycin (VANCOCIN) 1,500 mg in sodium  chloride 0.9 % 500 mL IVPB     1,500 mg 250 mL/hr over 120 Minutes Intravenous  Once 03/11/17 1505 03/11/17 1752   03/11/17 1016  gentamicin (GARAMYCIN) injection  Status:  Discontinued       As needed 03/11/17 1017 03/11/17 1040   03/11/17 0919  vancomycin (VANCOCIN) powder  Status:  Discontinued       As needed 03/11/17 0919 03/11/17 1040   03/11/17 0918  tobramycin (NEBCIN) powder  Status:  Discontinued       As needed 03/11/17 0918 03/11/17 1040   03/11/17 0630  clindamycin (CLEOCIN) IVPB 900 mg     900 mg 100 mL/hr over 30 Minutes Intravenous On call to O.R. 03/11/17 [id number] 03/11/17 0905    .  POD/HD#: 2  66 [facility]   -complex pelvic ring fracture s/p ORIF, pelvic osteomyelitis s/p ROH this admission with persistent infection (variable gm neg rods on intra-op gram stain)  WBAT B LEx  PT/OT  ROM as tolerated   Dressing changes as needed  Ok to clean wound with soap and water     6 weeks IV abx    PIC line place   vanc and ertapenem    [facility]   [facility] and [facility]     - Pain management:  Continue with current regimen  - ABL anemia/Hemodynamics  Stable     - Medical issues   Stable  Continue lovenox bridge and coumadin for mechanical heart valve  - DVT/PE prophylaxis:  Lovenox and coumadin for mechanical heart valve   - ID:   Vanc and ertapenem    - FEN/GI prophylaxis/Foley/Lines:  Reg diet   - Dispo:  Arrange HH   Dc home tomorrow    Mearl LatinKeith W. Theresa Dohrman, PA-C [facility] [phone] (P) [phone] (O) [phone] (C) 03/13/2017, 10:18 AM

## 2017-03-13 NOTE — Progress Notes (Signed)
PHARMACY CONSULT NOTE FOR:  OUTPATIENT  PARENTERAL ANTIBIOTIC THERAPY (OPAT)  Indication: Pelvic osteomyelitis  Regimen: Vancomycin 1250 mg every 12 hours + Ertapenem 1 gm every 24 hours End date: December 5th, 2018  VT = 20  drawn 10/25 at goal 15-20 no change  IV antibiotic discharge orders are pended. To discharging provider:  please sign these orders via discharge navigator,  Select New Orders & click on the button choice - Manage This Unsigned Work.     Thank you for allowing pharmacy to be a part of this patient's care.  Leota SauersLisa Jaleesa Cervi Pharm.D. CPP, BCPS Clinical Pharmacist [phone] 03/13/2017 5:58 PM
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
## 2017-03-14 ENCOUNTER — Telehealth (INDEPENDENT_AMBULATORY_CARE_PROVIDER_SITE_OTHER): Payer: Self-pay | Admitting: Orthopedic Surgery

## 2017-03-14 LAB — CBC
HEMATOCRIT: 34.8 % — AB (ref 39.0–52.0)
HEMOGLOBIN: 11.6 g/dL — AB (ref 13.0–17.0)
MCH: 30.4 pg (ref 26.0–34.0)
MCHC: 33.3 g/dL (ref 30.0–36.0)
MCV: 91.1 fL (ref 78.0–100.0)
Platelets: 201 10*3/uL (ref 150–400)
RBC: 3.82 MIL/uL — AB (ref 4.22–5.81)
RDW: 13.8 % (ref 11.5–15.5)
WBC: 4.6 10*3/uL (ref 4.0–10.5)

## 2017-03-14 LAB — PROTIME-INR
INR: 1.86
PROTHROMBIN TIME: 21.3 s — AB (ref 11.4–15.2)

## 2017-03-14 MED ORDER — ENOXAPARIN SODIUM 80 MG/0.8ML ~~LOC~~ SOLN: 75.0000 mg | Freq: Two times a day (BID) | SUBCUTANEOUS | 1 refills | Status: DC

## 2017-03-14 MED ORDER — TRAMADOL HCL 50 MG PO TABS: 50.0000 mg | ORAL_TABLET | Freq: Four times a day (QID) | ORAL | 0 refills | Status: DC | PRN

## 2017-03-14 MED ORDER — VANCOMYCIN IV (FOR PTA / DISCHARGE USE ONLY): 1250.0000 mg | Freq: Two times a day (BID) | INTRAVENOUS | 0 refills | Status: AC

## 2017-03-14 MED ORDER — WARFARIN SODIUM 2.5 MG PO TABS: 7.5000 mg | ORAL_TABLET | ORAL | 0 refills | Status: AC

## 2017-03-14 MED ORDER — ERTAPENEM IV (FOR PTA / DISCHARGE USE ONLY): 1.0000 g | INTRAVENOUS | 0 refills | Status: AC

## 2017-03-14 NOTE — Progress Notes (Signed)
[facility] for Infectious Disease    Date of Admission:  03/11/2017   Total days of antibiotics 4        Day 4 vanco/day 2 erta          ID: Caleb Dawson is a 66 y.o. male with polymicrobial pelvic HW infection s/p ROH Principal Problem:   Osteomyelitis ([place]), pelvis  Active Problems:   S/P AVR (aortic valve replacement)   Bicuspid aortic valve   Hypothyroid   Chronic anticoagulation    Subjective: No pain,mild discomfort on left side of incision ROS: No fever, chills, nightsweats, or diarrhea  cx remain NGTD at 72hr but one gram stain shows rare gram variable rod  Medications:  . docusate sodium  100 mg Oral BID  . enoxaparin (LOVENOX) injection  75 mg Subcutaneous Q12H  . levothyroxine  50 mcg Oral QAC breakfast  . sertraline  75 mg Oral Daily  . sodium chloride flush  10-40 mL Intracatheter Q12H  . valACYclovir  1,000 mg Oral Daily  . Warfarin - Pharmacist Dosing Inpatient   Does not apply q1800    Objective: Vital signs in last 24 hours: Temp:  [98 F (36.7 C)-98.5 F (36.9 C)] 98.2 F (36.8 C) (10/26 1724) Pulse Rate:  [65-75] 65 (10/26 1724) Resp:  [16-18] 17 (10/26 1724) BP: (110-152)/(69-82) 132/76 (10/26 1724) SpO2:  [97 %-99 %] 98 % (10/26 1724) Physical Exam  Constitutional: He is oriented to person, place, and time. He appears well-developed and well-nourished. No distress.  HENT:  Mouth/Throat: Oropharynx is clear and moist. No oropharyngeal exudate.  Abdominal: Soft. Bowel sounds are normal. He exhibits no distension. There is no tenderness. Surgical incision site is c/d/i- no erythema.  Ext: right arm picc line is intact     Lab Results  Recent Labs  03/13/17 0502 03/14/17 0405  WBC 4.6 4.6  HGB 11.0* 11.6*  HCT 32.8* 34.8*  NA 136  --   K 3.9  --   CL 102  --   CO2 28  --   BUN 10  --   CREATININE 0.84  --    Lab Results  Component Value Date   ESRSEDRATE 16 03/11/2017   Lab Results  Component Value Date   CRP <0.8  03/11/2017    Microbiology: 10/23 tissue cx = ngtd Studies/Results: No results found.   Assessment/Plan: Hx of polymicrobial pelvic osteo (finegoldia, morgenella, diptheroirds and propio.Marland Kitchen)= previously treated with vancomycin plus ertapenem then switched to oral cipro plus metronidazole which patient did not tolerate  - plan on sending out on vancomycin plus ertapenem again and can change abtx if culture results show something new. - plan to treat for 6 wk again  Leukopenia = resolved    Home health orders listed below --------------------- Diagnosis: Pelvic osteo  Culture Result: pending  Allergies  Allergen Reactions  . Scallops [Shellfish Allergy] Nausea And Vomiting    OPAT Orders Discharge antibiotics: Per pharmacy protocol  Vancomycin plus ertapenem 1gm iv daily Aim for Vancomycin trough 15-20 (unless otherwise indicated) Duration: 4 wks End Date: Dec 5th  [place] Per Protocol:  Labs weekly while on IV antibiotics: __x CBC with differential x__ BMP twice per week _x_ CRP _x_ ESR _x_ Vancomycin trough   _x_ Please leave PIC in place until doctor has seen patient or been notified  Fax weekly labs to [phone]  Clinic Follow Up Appt: 4 wk with dr Lucianne Lei dam @   [place]  for Infectious Diseases Cell: [phone] Pager: [phone]  03/14/2017, 5:48 PM

## 2017-03-14 NOTE — Brief Op Note (Signed)
03/11/2017  4:44 PM  PATIENT:  Caleb ArthursJohn J Dawson  66 y.o. male  PRE-OPERATIVE DIAGNOSIS:  1. POLYMICROBIAL OSTEOMYELITIS OF PELVIS 2. LOOSE HARDWARE PUBIC SYMPHSYSIS  POST-OPERATIVE DIAGNOSIS:   1. POLYMICROBIAL OSTEOMYELITIS OF PELVIS 2. LOOSE HARDWARE PUBIC SYMPHSYSIS  PROCEDURE:  Procedure(s): 1. PARTIAL EXCISION OF PELVIS (N/A) 2. HARDWARE REMOVAL (N/A) 3. PLACEMENT OF ANTIBIOTIC BEADS 4. STRESS FLOURO OF PUBIC SYMPHYSIS  SURGEON:  Surgeon(s) and Role:    * Myrene GalasHandy, Raivyn Kabler, MD - Primary  PHYSICIAN ASSISTANT: KEITH PAUL,PA-C  ANESTHESIA:   general  EBL:  100 mL   BLOOD ADMINISTERED:none  DRAINS: none   LOCAL MEDICATIONS USED:  NONE  SPECIMEN:  Source of Specimen:  DEEP FLUID, SEROMA AROUND PLATE  DISPOSITION OF SPECIMEN:  MICRO  COUNTS:  YES  TOURNIQUET:  * No tourniquets in log *  DICTATION: .Other Dictation: Dictation Number [id number]  PLAN OF CARE: Admit to inpatient   PATIENT DISPOSITION:  PACU - hemodynamically stable.   Delay start of Pharmacological VTE agent (>24hrs) due to surgical blood loss or risk of bleeding: no

## 2017-03-14 NOTE — Telephone Encounter (Signed)
Caleb Dawson with [employer] called needing to know if the patient had surgery and how long was his stay in the hospital. The number to contact [place] is  [phone]
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
## 2017-03-14 NOTE — Progress Notes (Signed)
Pt discharged home with girlfriend. AVS and scripts reviewed and given to patient. Home infusion nurse saw patient at bedside prior to d/c and discussed first visit for tomorrow morning and antibiotics will be delivered to patient's home tonight. All belongings sent with patient. VSS. BP 132/76 (BP Location: Left Arm)   Pulse 65   Temp 98.2 F (36.8 C) (Oral)   Resp 17   Ht 5\' 8"  (1.727 m)   Wt 74.8 kg (165 lb)   SpO2 98%   BMI 25.09 kg/m

## 2017-03-16 LAB — AEROBIC/ANAEROBIC CULTURE (SURGICAL/DEEP WOUND)
CULTURE: NO GROWTH
GRAM STAIN: NONE SEEN

## 2017-03-16 LAB — AEROBIC/ANAEROBIC CULTURE W GRAM STAIN (SURGICAL/DEEP WOUND)
Culture: NO GROWTH
Gram Stain: NONE SEEN

## 2017-03-17 ENCOUNTER — Inpatient Hospital Stay (INDEPENDENT_AMBULATORY_CARE_PROVIDER_SITE_OTHER): Payer: BLUE CROSS/BLUE SHIELD | Admitting: Orthopedic Surgery
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
## 2017-03-17 NOTE — Telephone Encounter (Signed)
Tried calling back. No answer. LMVM advising was returning her call.

## 2017-03-18 ENCOUNTER — Encounter (HOSPITAL_COMMUNITY): Payer: Self-pay | Admitting: Orthopedic Surgery

## 2017-03-21 ENCOUNTER — Encounter: Payer: Self-pay | Admitting: Internal Medicine

## 2017-03-24 ENCOUNTER — Encounter: Payer: Self-pay | Admitting: Internal Medicine

## 2017-03-24 NOTE — Telephone Encounter (Signed)
Spoke with patient about rescheduling.  He is going to Dr. Daiva EvesVan Dam on 04-09-17.  He will call me after that visit to set a date for surgery.

## 2017-03-27 ENCOUNTER — Other Ambulatory Visit: Payer: Self-pay | Admitting: Pharmacist

## 2017-03-28 ENCOUNTER — Other Ambulatory Visit: Payer: Self-pay | Admitting: Pharmacist

## 2017-04-01 ENCOUNTER — Other Ambulatory Visit: Payer: Self-pay | Admitting: Pharmacist

## 2017-04-07 ENCOUNTER — Encounter: Payer: Self-pay | Admitting: Infectious Disease

## 2017-04-09 ENCOUNTER — Telehealth: Payer: Self-pay

## 2017-04-09 ENCOUNTER — Encounter: Payer: Self-pay | Admitting: Infectious Disease

## 2017-04-09 ENCOUNTER — Ambulatory Visit (INDEPENDENT_AMBULATORY_CARE_PROVIDER_SITE_OTHER): Payer: BLUE CROSS/BLUE SHIELD | Admitting: Infectious Disease

## 2017-04-09 VITALS — BP 112/76 | HR 64 | Temp 98.1°F | Ht 68.0 in | Wt 164.0 lb

## 2017-04-09 DIAGNOSIS — Z952 Presence of prosthetic heart valve: Secondary | ICD-10-CM | POA: Diagnosis not present

## 2017-04-09 DIAGNOSIS — A499 Bacterial infection, unspecified: Secondary | ICD-10-CM | POA: Diagnosis not present

## 2017-04-09 DIAGNOSIS — I38 Endocarditis, valve unspecified: Secondary | ICD-10-CM

## 2017-04-09 DIAGNOSIS — T826XXD Infection and inflammatory reaction due to cardiac valve prosthesis, subsequent encounter: Secondary | ICD-10-CM | POA: Diagnosis not present

## 2017-04-09 DIAGNOSIS — T847XXD Infection and inflammatory reaction due to other internal orthopedic prosthetic devices, implants and grafts, subsequent encounter: Secondary | ICD-10-CM

## 2017-04-09 MED ORDER — MOXIFLOXACIN HCL 400 MG PO TABS: 400.0000 mg | ORAL_TABLET | Freq: Every day | ORAL | 1 refills | Status: DC

## 2017-04-09 MED ORDER — DOXYCYCLINE HYCLATE 100 MG PO TABS: 100.0000 mg | ORAL_TABLET | Freq: Two times a day (BID) | ORAL | 1 refills | Status: DC

## 2017-04-09 NOTE — Telephone Encounter (Signed)
Per Dr. Daiva EvesVan Dam called [facility] and informed them to pull the PICC when antibiotics complete. Spoke with [name] at [facility] and she repeated and confirmed the verbal order. Towanda OctaveLanatra Joycelynn Fritsche

## 2017-04-09 NOTE — Progress Notes (Signed)
Subjective:   Chief complaint: loose stools with antibiotics and redness at incision site   Patient ID: Caleb Dawson, male    DOB: [birth year], 66 y.o.   MRN: [id number]  HPI  66 y.o. male with  Polymicrobial infection overlying hardware and fractured pelvis  He had previously grown out Caleb Dawson to AMP/SUL, CEFAZ, S to CTX, , finegoldia,  propionobacterium  And also a few  diphtheroids on culture.  I treated him with IV invanz and vancomycin to cover for ALL of these organisms.  He had done well for the most part. He did see Dr. Marcelino Scot before my last visit  who was concerned he said due to loosening of hardware attributed to the pt bearing weight too much on this side.  His ESR is actually down to 16 on most recent check with home labs.  He was having  surgery on both knees contemplated by Dr. Marlou Sa.  We had concern about persistence of polymicrobial infection involving the hardware. I DC his PICC and put him on levaquin to cover the Caleb (S to FQ) and his anerobes. He has had trouble tolerating this with poor appetite, nausea and loose stools but has continued it.   I had discussion with Dr. Marcelino Scot last week and we both felt best option in Caleb Dawson was to have his hardware removed and then follow this with IV antibiotics in hopes of curing his polymicrobial infection.  He ended up undergoing surgery that showed EXTENSIVE biofilm over the hardware. Patient underwent removal of hardware with partial excision of bone from pelvis and placement of antibiotic beads. Cultures were again taken and this time revealed a coagulase negative staphylococcus.  He was placed back on IV invanz and IV vancomycin. He claims his pain in his hip is improved. He did have erythema and hematoma that emerged at the operative site and Dr. Marcelino Scot aspirated this and I believe sent for culture.  ESR has been still elevated in high 30s and quant CRP up though latter is trending down now.  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   Past Medical History:  Diagnosis Date  . Arthritis   . Bicuspid aortic valve    s/p Bentall procedure 2013  . Cardiac tamponade   . Chronic anticoagulation 03/13/2017  . Headache    PMH: Migraines  . Hypothyroid   . Osteomyelitis ([place]), pelvis  03/11/2017  . Peritonitis ([place])   . Pneumonia   . Poor appetite 03/05/2017  . Stroke [facility])     Past Surgical History:  Procedure Laterality Date  . AORTIC VALVE REPLACEMENT  2013   Bentall procedure w/ mechanical Aortic valve, post-op afib, post-op CVA req feeding tube, post-op tamponade  . APPLICATION OF WOUND VAC Left 11/28/2016   Procedure: APPLICATION OF WOUND VAC;  Surgeon: Altamese [place], MD;  Location: [place];  Service: Orthopedics;  Laterality: Left;  . BONE EXCISION N/A 03/11/2017   Procedure: PARTIAL EXCISION OF PELVIS;  Surgeon: Altamese Warwick, MD;  Location: [place];  Service: Orthopedics;  Laterality: N/A;  . CLOSED REDUCTION FINGER WITH PERCUTANEOUS PINNING Right 12/07/2016   Procedure: CLOSED REDUCTION FINGER WITH PERCUTANEOUS PINNING;  Surgeon: Leanora Cover, MD;  Location: [place];  Service: Orthopedics;  Laterality: Right;  . EXTERNAL FIXATION PELVIS N/A 11/28/2016   Procedure: EXTERNAL FIXATION PELVIS;  Surgeon: Altamese , MD;  Location:  [place] [facility];  Service: Orthopedics;  Laterality: N/A;  . EXTERNAL FIXATION REMOVAL N/A 12/10/2016   Procedure: REMOVAL EXTERNAL FIXATION PELVIS;  Surgeon: Altamese [place], MD;  Location: [place];  Service: Orthopedics;  Laterality: N/A;  . HARDWARE REMOVAL N/A 03/11/2017   Procedure: HARDWARE REMOVAL;  Surgeon: Altamese Chula, MD;  Location: [place];  Service: Orthopedics;  Laterality: N/A;  . I&D EXTREMITY Bilateral 11/28/2016   Procedure: IRRIGATION AND DEBRIDEMENT EXTREMITY LOWER ANTERIOR LEGs;  Surgeon: Altamese Hoxie, MD;  Location: [place];  Service:  Orthopedics;  Laterality: Bilateral;  . I&D EXTREMITY N/A 12/19/2016   Procedure: IRRIGATION AND DEBRIDEMENT PELVIS;  Surgeon: Altamese Boonville, MD;  Location: [place];  Service: Orthopedics;  Laterality: N/A;  . I&D EXTREMITY N/A 12/21/2016   Procedure: IRRIGATION AND DEBRIDEMENT PELVIS WITH VAC CHANGE;  Surgeon: Altamese Hobson, MD;  Location: [place];  Service: Orthopedics;  Laterality: N/A;  . INCISION AND DRAINAGE OF WOUND N/A 12/24/2016   Procedure: IRRIGATION AND DEBRIDEMENT WOUND WITH LAYERED CLOSURE;  Surgeon: Altamese [place], MD;  Location: [place];  Service: Orthopedics;  Laterality: N/A;  . IR HYBRID TRAUMA EMBOLIZATION  11/28/2016  . ORIF PELVIC FRACTURE N/A 12/10/2016   Procedure: OPEN REDUCTION INTERNAL FIXATION (ORIF) PELVIC FRACTURE;  Surgeon: Altamese Dickey, MD;  Location: [place];  Service: Orthopedics;  Laterality: N/A;  . PELVIC ANGIOGRAPHY N/A 11/28/2016   Procedure: PELVIC ANGIOGRAPHY CPT 27517;  Surgeon: Corrie Mckusick, DO;  Location: [place];  Service: Anesthesiology;  Laterality: N/A;  . SACROILIAC JOINT FUSION Bilateral 11/28/2016   Procedure: SACROILIAC JOINT FUSION;  Surgeon: Altamese Moorland, MD;  Location: [place];  Service: Orthopedics;  Laterality: Bilateral;  . TEE WITHOUT CARDIOVERSION N/A 12/27/2016   Procedure: TRANSESOPHAGEAL ECHOCARDIOGRAM (TEE);  Surgeon: Pixie Casino, MD;  Location[facility];  Service: Cardiovascular;  Laterality: N/A;  . ULTRASOUND GUIDANCE FOR VASCULAR ACCESS Left 11/28/2016   Procedure: ULTRASOUND GUIDANCE FOR VASCULAR ACCESS, left iliac artery CPT 00174;  Surgeon: Corrie Mckusick, DO;  Location: [place];  Service: Anesthesiology;  Laterality: Left;    Family History  Problem Relation Age of Onset  . Hypertension Mother        born in 60  . Anemia Mother   . Sudden Cardiac Death Father 108      Social History   Socioeconomic History  . Marital status: Single    Spouse name: None  . Number of children: None  . Years of education: None  . Highest  education level: None  Social Needs  . Financial resource strain: None  . Food insecurity - worry: None  . Food insecurity - inability: None  . Transportation needs - medical: None  . Transportation needs - non-medical: None  Occupational History  . Occupation: Programmer, applications, geologist  Tobacco Use  . Smoking status: Never Smoker  . Smokeless tobacco: Never Used  Substance and Sexual Activity  . Alcohol use: No  . Drug use: No  . Sexual activity: None  Other Topics Concern  . None  Social History Narrative   Pt lives with his wife.    Allergies  Allergen Reactions  . Scall[place] [Shellfish Allergy] Nausea And Vomiting     Current Outpatient Medications:  .  acetaminophen (TYLENOL) 500 MG tablet, Take 1,000 mg by mouth every 6 (six) hours as needed for moderate pain or headache., Disp: , Rfl:  .  levothyroxine (SYNTHROID, LEVOTHROID) 50 MCG tablet, Take 50 mcg by mouth daily., Disp: , Rfl:  .  Probiotic Product (PROBIOTIC PO),  Take 1 capsule by mouth 2 (two) times daily., Disp: , Rfl:  .  sertraline (ZOLOFT) 50 MG tablet, Take 75 mg by mouth daily., Disp: , Rfl:  .  traMADol (ULTRAM) 50 MG tablet, Take 1-2 tablets (50-100 mg total) by mouth every 6 (six) hours as needed., Disp: 45 tablet, Rfl: 0 .  valACYclovir (VALTREX) 1000 MG tablet, Take 1,000 mg by mouth daily., Disp: , Rfl:  .  vancomycin IVPB, Inject 1,250 mg into the vein every 12 (twelve) hours. Indication: Pelvic osteomyelitis Last Day of Therapy: December 5th, 2018 Labs - Sunday/Monday:  CBC/D, BMP, and vancomycin trough. Labs - Thursday:  BMP and vancomycin trough Labs - Every other week:  ESR and CRP, Disp: 84 Units, Rfl: 0 .  warfarin (COUMADIN) 2.5 MG tablet, Take 3-4.5 tablets (7.5-11.25 mg total) by mouth See admin instructions. Take 7.5 mg by mouth daily on Monday, Wednesday, Friday, Saturday and Sunday. Take 11.25 mg by mouth daily on Tuesday and Thursday., Disp: 135 tablet, Rfl: 0 .  enoxaparin (LOVENOX) 80  MG/0.8ML injection, Inject 0.75 mLs (75 mg total) into the skin every 12 (twelve) hours. (Patient not taking: Reported on 04/09/2017), Disp: 10 Syringe, Rfl: 1 .  ertapenem (INVANZ) IVPB, Inject 1 g into the vein daily. Indication:  Pelvic osteomyelitis  Last Day of Therapy:  December 5th, 2018 Labs - Once weekly:  CBC/D and BMP, Labs - Every other week:  ESR and CRP, Disp: 42 Units, Rfl: 0 .  Multiple Vitamin (MULTIVITAMIN WITH MINERALS) TABS tablet, Take 1 tablet by mouth daily. (Patient not taking: Reported on 02/25/2017), Disp: , Rfl:    Review of Systems  Constitutional: Negative for appetite change, chills and fever.  HENT: Negative for congestion and sore throat.   Eyes: Negative for photophobia.  Respiratory: Negative for cough, shortness of breath and wheezing.   Cardiovascular: Negative for chest pain, palpitations and leg swelling.  Gastrointestinal: Positive for diarrhea. Negative for abdominal pain, blood in stool, constipation, nausea and vomiting.  Genitourinary: Negative for dysuria, flank pain and hematuria.  Musculoskeletal: Positive for arthralgias. Negative for myalgias.  Skin: Positive for color change and wound. Negative for rash.  Neurological: Negative for dizziness, weakness and headaches.  Hematological: Does not bruise/bleed easily.  Psychiatric/Behavioral: Negative for suicidal ideas.       Objective:   Physical Exam  Constitutional: He is oriented to person, place, and time. He appears well-developed and well-nourished. No distress.  HENT:  Head: Normocephalic and atraumatic.  Mouth/Throat: No oropharyngeal exudate.  Eyes: Conjunctivae and EOM are normal. No scleral icterus.  Neck: Normal range of motion. Neck supple.  Cardiovascular: Normal rate and regular rhythm.  Murmur heard. Metallic click of valve audible  Pulmonary/Chest: Effort normal. No respiratory distress. He has no wheezes.  Abdominal: Soft. He exhibits no distension. There is tenderness.    Musculoskeletal: He exhibits no edema or tenderness.  Neurological: He is alert and oriented to person, place, and time. He exhibits normal muscle tone. Coordination normal.  Skin: Skin is warm and dry. No rash noted. He is not diaphoretic. There is erythema. No pallor.  Psychiatric: He has a normal mood and affect. His behavior is normal. Judgment and thought content normal.  Nursing note and vitals reviewed.  He has erythema overlying the surgical site which he says has gone down along with the swelling   04/09/17:     PICC site   04/09/17:          Assessment & Plan:  Polymicrobial infection near hardware of fractured pelvis:  I will have him finish his IV antibiotics and DC PICC which will complete more than  Weeks of postop abx. 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 I am happy to take care of him when he is in [place]  Knee pathology: Patient is anxious for this to be taken care of in [place]. I am not sure this makes the most sense esp if Dr. Marlou Sa does not want to operate on him until he is off abx. (I dont want to take him off abx prematurely given location of the infectiono and given my desire to protect his prosthetic valve  I spent greater than 40 minutes with the patient including greater than 50% of time in face to face counsel of the patient re the organims we had recovered, my rationale for IV and oral antibiotic we would use, plan of care and in coordination of his  care.

## 2017-04-14 ENCOUNTER — Encounter: Payer: Self-pay | Admitting: Infectious Disease

## 2017-04-17 ENCOUNTER — Encounter: Payer: Self-pay | Admitting: Infectious Disease

## 2017-04-17 ENCOUNTER — Other Ambulatory Visit: Payer: Self-pay | Admitting: Pharmacist

## 2017-04-17 ENCOUNTER — Telehealth: Payer: Self-pay | Admitting: *Deleted

## 2017-04-17 NOTE — Telephone Encounter (Signed)
Jeri ModenaPam Chandler also reached out to me I asked her to have him stop IV and change to po abx I will also touch base with Myrene GalasMichael Handy

## 2017-04-17 NOTE — Progress Notes (Signed)
[facility]  Call from Ruthann CancerKerrin Jarrell, RN, [facility] from [facility] stating PICC dislodged and no longer usable for IV ABX as centrally placed access.  Call to Dr Daiva EvesVan Dam and verbal order obtained to DC current PICC and pt to start PO ABX. Call/voicemail message to Alesia Morinravis Poole, CMA to confirm plan and that pt does have script for PO ABX.    If patient discharges after hours, please call [phone].   Sedalia Mutaamela S Chandler 04/17/2017, 11:38 AM

## 2017-04-17 NOTE — Telephone Encounter (Signed)
LPN Georgiann Cockericky Shepard who is taking care of the patient PICC dressing called to advise that the line is working its way out and he is not able to get his medication. His last day for IV meds is 04/23/17 and she wants to know if we want to have the PICC replaced for the last 6 days or should the line be pulled and the patient start on oral medication. Advised will give the provider the message and once he responds will give her a call back.  LPN Britt Boozericky [phone] RN Clydie BraunKaren [phone]

## 2017-04-18 NOTE — Telephone Encounter (Signed)
Patient called to advise his PICC is out and he wanted to verify which oral medications he should be taking. Advised him we knew about the PICC and Dr Daiva EvesVan Dam has sent in some Doxy and Avelox to start now and take until his follow up visit in January. Patient advise he understands. Advised him if he has any stomach trouble or problem please give the office a call.

## 2017-04-18 NOTE — Telephone Encounter (Signed)
Thanks so much Travis! 

## 2017-04-21 ENCOUNTER — Telehealth (INDEPENDENT_AMBULATORY_CARE_PROVIDER_SITE_OTHER): Payer: Self-pay | Admitting: Orthopedic Surgery

## 2017-04-21 ENCOUNTER — Telehealth: Payer: Self-pay | Admitting: *Deleted

## 2017-04-21 NOTE — Telephone Encounter (Signed)
Please advise. Thanks.  

## 2017-04-21 NOTE — Telephone Encounter (Signed)
Patient left a message in triage requesting Dr Daiva EvesVan Dam and Dr August Saucerean discuss timing of surgery regarding having antibiotics on board or not.  He states he has heard conflicting opinions regarding this, does not know what to do.  Andree CossHowell, Janeka Libman M, RN

## 2017-04-21 NOTE — Telephone Encounter (Signed)
Patient called asking if Dr. August Saucerean and Dr. Daiva EvesVan Dam could talk and discuss surgery. CB # [phone]

## 2017-04-22 ENCOUNTER — Telehealth (INDEPENDENT_AMBULATORY_CARE_PROVIDER_SITE_OTHER): Payer: Self-pay | Admitting: Orthopedic Surgery

## 2017-04-22 NOTE — Telephone Encounter (Signed)
Patient called wanting to set up his knee replacement surgery before the end of the year with Dr. August Saucerean if possible. CB # [phone]

## 2017-04-23 NOTE — Telephone Encounter (Signed)
Have you spoken with Dr. Daiva EvesVan Dam about patient and when surgery can be done?

## 2017-04-23 NOTE — Telephone Encounter (Signed)
IC patient no answer. LM advised Per Dr Daiva EvesVan Dam he felt ok for patient to proceed with surgery. Advised him Dr Diamantina Providenceean's surgery scheduler would be in touch with him to get it scheduled.

## 2017-04-23 NOTE — Telephone Encounter (Signed)
I talked to Dr. Algis LimingVandam and from his perspective we are okay to proceed with the knee surgery.  In general he is continuing on antibiotics for the reason of protecting his heart valve.  There is no active infection and he has already had 6 weeks of IV antibiotics

## 2017-04-23 NOTE — Telephone Encounter (Signed)
Pls call him or have sherry call to let him know we will schedule - Zenaida Niecevan dam said ok

## 2017-04-23 NOTE — Telephone Encounter (Signed)
Dr August Saucerean called and LM for Dr Daiva EvesVan Dam to call him back.

## 2017-04-23 NOTE — Telephone Encounter (Signed)
Had discussion today with Dr. August Saucerean

## 2017-05-05 ENCOUNTER — Telehealth (INDEPENDENT_AMBULATORY_CARE_PROVIDER_SITE_OTHER): Payer: Self-pay | Admitting: Orthopedic Surgery

## 2017-05-05 NOTE — Telephone Encounter (Signed)
Isn't this something that short stay should advise him of? His surgery is on Friday.

## 2017-05-05 NOTE — Telephone Encounter (Signed)
He should be admitted to the medical service or the medical service should have a plan for him and he should likely be admitted Wednesday or Thursday.  This is a heart valve patient so

## 2017-05-05 NOTE — Telephone Encounter (Signed)
Pt called and he needs to know when he needs to come off warfarin med  prior to his sched surgery 05/09/2017.

## 2017-05-06 NOTE — Telephone Encounter (Signed)
Caleb Dawson has addressed with patient yesterday afternoon and will follow up on this today.

## 2017-05-06 NOTE — Pre-Procedure Instructions (Signed)
Caleb ArthursJohn J Dawson  05/06/2017      [facility] [address] [place], [place] - [address] [facility] [address] [place] [id number] Phone: [phone] Fax: [phone]  [facility] [place], [place] - [id number] [facility] AT [facility] [address] [facility] [address] [place] [address] Phone: [phone] Fax: [phone]  [facility] - [place] - [address] AT [facility] [address] [place] [address] Phone: [phone] Fax: [phone]    Your procedure is scheduled on May 09, 2017.  [facility] at 530 AM.  Call this number if you have problems the morning of surgery:  [phone]   Remember:  Do not eat food or drink liquids after midnight.  Take these medicines the morning of surgery with A SIP OF WATER acetaminophen (tylenol), doxycycline (vibra-tabs), levothyroxine (synthroid), moxifloxacin (avelox), sertraline (zoloft), tramadol (ultram)-if needed for pain, valacyclovir (valtrex).  Stop warfarin/coumadin as instructed by your surgeon.  Beginning now, STOP taking any Aspirin (unless otherwise instructed by your surgeon), Aleve, Naproxen, Ibuprofen, Motrin, Advil, Goody's, BC's, all herbal medications, fish oil, and all vitamins  Continue all other medications as instructed by your physician except follow the above medication instructions before surgery  Do not wear jewelry, make-up or nail polish.  Do not wear lotions, powders, or colognes, or deoderant.  Men may shave face and neck.  Do not bring valuables to the hospital.  [facility] is not responsible for any belongings or valuables.  Contacts, dentures or bridgework may not be worn into surgery.  Leave your suitcase in the car.  After surgery it may be brought to your room.  For patients admitted to the hospital, discharge time will be determined by your treatment  team.  Patients discharged the day of surgery will not be allowed to drive home.   Special instructions: [facility]- Preparing For Surgery  Before surgery, you can play an important role. Because skin is not sterile, your skin needs to be as free of germs as possible. You can reduce the number of germs on your skin by washing with CHG (chlorahexidine gluconate) Soap before surgery.  CHG is an antiseptic cleaner which kills germs and bonds with the skin to continue killing germs even after washing.  Please do not use if you have an allergy to CHG or antibacterial soaps. If your skin becomes reddened/irritated stop using the CHG.  Do not shave (including legs and underarms) for at least 48 hours prior to first CHG shower. It is OK to shave your face.  Please follow these instructions carefully.   1. Shower the NIGHT BEFORE SURGERY and the MORNING OF SURGERY with CHG.   2. If you chose to wash your hair, wash your hair first as usual with your normal shampoo.  3. After you shampoo, rinse your hair and body thoroughly to remove the shampoo.  4. Use CHG as you would any other liquid soap. You can apply CHG directly to the skin and wash gently with a scrungie or a clean washcloth.   5. Apply the CHG Soap to your body ONLY FROM THE NECK DOWN.  Do not use on open wounds or open sores. Avoid contact with your eyes, ears, mouth and genitals (private parts). Wash Face and genitals (private parts)  with your normal soap.  6. Wash thoroughly, paying special attention to the area where your surgery will be  performed.  7. Thoroughly rinse your body with warm water from the neck down.  8. DO NOT shower/wash with your normal soap after using and rinsing off the CHG Soap.  9. Pat yourself dry with a CLEAN TOWEL.  10. Wear CLEAN PAJAMAS to bed the night before surgery, wear comfortable clothes the morning of surgery  11. Place CLEAN SHEETS on your bed the night of your first shower and DO NOT SLEEP  WITH PETS.  Day of Surgery: Do not apply any deodorants/lotions. Please wear clean clothes to the hospital/surgery center.    Please read over the following fact sheets that you were given. Pain Booklet, Coughing and Deep Breathing and Surgical Site Infection Prevention

## 2017-05-07 ENCOUNTER — Telehealth (INDEPENDENT_AMBULATORY_CARE_PROVIDER_SITE_OTHER): Payer: Self-pay | Admitting: Orthopedic Surgery

## 2017-05-07 ENCOUNTER — Encounter (HOSPITAL_COMMUNITY)
Admission: RE | Admit: 2017-05-07 | Discharge: 2017-05-07 | Disposition: A | Discharge status: Home / Self Care | Payer: BLUE CROSS/BLUE SHIELD | Source: Ambulatory Visit | Attending: Orthopedic Surgery | Admitting: Orthopedic Surgery

## 2017-05-07 ENCOUNTER — Other Ambulatory Visit (INDEPENDENT_AMBULATORY_CARE_PROVIDER_SITE_OTHER): Payer: Self-pay | Admitting: Orthopedic Surgery

## 2017-05-07 ENCOUNTER — Other Ambulatory Visit: Payer: Self-pay

## 2017-05-07 ENCOUNTER — Encounter (HOSPITAL_COMMUNITY): Payer: Self-pay

## 2017-05-07 DIAGNOSIS — Z01812 Encounter for preprocedural laboratory examination: Secondary | ICD-10-CM | POA: Insufficient documentation

## 2017-05-07 DIAGNOSIS — S83512A Sprain of anterior cruciate ligament of left knee, initial encounter: Secondary | ICD-10-CM

## 2017-05-07 HISTORY — DX: Anxiety disorder, unspecified: F41.9

## 2017-05-07 LAB — CBC
HCT: 37.1 % — ABNORMAL LOW (ref 39.0–52.0)
Hemoglobin: 12.5 g/dL — ABNORMAL LOW (ref 13.0–17.0)
MCH: 30.7 pg (ref 26.0–34.0)
MCHC: 33.7 g/dL (ref 30.0–36.0)
MCV: 91.2 fL (ref 78.0–100.0)
PLATELETS: 224 10*3/uL (ref 150–400)
RBC: 4.07 MIL/uL — AB (ref 4.22–5.81)
RDW: 14.4 % (ref 11.5–15.5)
WBC: 6 10*3/uL (ref 4.0–10.5)

## 2017-05-07 LAB — BASIC METABOLIC PANEL
ANION GAP: 9 (ref 5–15)
BUN: 14 mg/dL (ref 6–20)
CO2: 22 mmol/L (ref 22–32)
Calcium: 9 mg/dL (ref 8.9–10.3)
Chloride: 99 mmol/L — ABNORMAL LOW (ref 101–111)
Creatinine, Ser: 0.88 mg/dL (ref 0.61–1.24)
GLUCOSE: 99 mg/dL (ref 65–99)
POTASSIUM: 4.2 mmol/L (ref 3.5–5.1)
SODIUM: 130 mmol/L — AB (ref 135–145)

## 2017-05-07 NOTE — Telephone Encounter (Signed)
Patient called asking about his Lovenox, said the pharmacy hasn't received his RX, and he's suppose to start taking it today. CB # [phone]

## 2017-05-07 NOTE — Telephone Encounter (Signed)
Patient called back saying he has an appointment for his [facility] at [facility] today at 2 and was wondering if his Lovenox RX could possibly be sent this one time [facility]. CB # [phone]

## 2017-05-07 NOTE — Telephone Encounter (Signed)
IC s/[facility] and she advised needed 75mg  sub q q 12 hrs. 10 day supply written. Lat dose prior to surgery will be 12/20 at 6pm. IC patient to advise needed to pick up rx to take to pharmacy.
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
## 2017-05-09 ENCOUNTER — Inpatient Hospital Stay (HOSPITAL_COMMUNITY)
Admission: AD | Admit: 2017-05-09 | Discharge: 2017-05-19 | DRG: 488 | Disposition: A | Discharge status: Rehabilitation Facility | Payer: BLUE CROSS/BLUE SHIELD | Source: Ambulatory Visit | Attending: Orthopedic Surgery | Admitting: Orthopedic Surgery

## 2017-05-09 ENCOUNTER — Ambulatory Visit (HOSPITAL_COMMUNITY): Payer: BLUE CROSS/BLUE SHIELD

## 2017-05-09 ENCOUNTER — Ambulatory Visit (HOSPITAL_COMMUNITY): Payer: BLUE CROSS/BLUE SHIELD | Admitting: Emergency Medicine

## 2017-05-09 ENCOUNTER — Encounter (HOSPITAL_COMMUNITY)
Admission: AD | Disposition: A | Discharge status: Rehabilitation Facility | Payer: Self-pay | Source: Ambulatory Visit | Attending: Orthopedic Surgery

## 2017-05-09 ENCOUNTER — Encounter (INDEPENDENT_AMBULATORY_CARE_PROVIDER_SITE_OTHER): Payer: Self-pay | Admitting: Orthopedic Surgery

## 2017-05-09 ENCOUNTER — Encounter (HOSPITAL_COMMUNITY): Payer: Self-pay | Admitting: *Deleted

## 2017-05-09 ENCOUNTER — Ambulatory Visit (HOSPITAL_COMMUNITY): Payer: BLUE CROSS/BLUE SHIELD | Admitting: Anesthesiology

## 2017-05-09 DIAGNOSIS — S8992XA Unspecified injury of left lower leg, initial encounter: Secondary | ICD-10-CM | POA: Diagnosis not present

## 2017-05-09 DIAGNOSIS — X58XXXA Exposure to other specified factors, initial encounter: Secondary | ICD-10-CM | POA: Diagnosis present

## 2017-05-09 DIAGNOSIS — E871 Hypo-osmolality and hyponatremia: Secondary | ICD-10-CM | POA: Diagnosis not present

## 2017-05-09 DIAGNOSIS — S83512A Sprain of anterior cruciate ligament of left knee, initial encounter: Secondary | ICD-10-CM | POA: Diagnosis not present

## 2017-05-09 DIAGNOSIS — Z91013 Allergy to seafood: Secondary | ICD-10-CM

## 2017-05-09 DIAGNOSIS — E039 Hypothyroidism, unspecified: Secondary | ICD-10-CM | POA: Diagnosis present

## 2017-05-09 DIAGNOSIS — S8991XA Unspecified injury of right lower leg, initial encounter: Secondary | ICD-10-CM

## 2017-05-09 DIAGNOSIS — Z7901 Long term (current) use of anticoagulants: Secondary | ICD-10-CM

## 2017-05-09 DIAGNOSIS — S83521A Sprain of posterior cruciate ligament of right knee, initial encounter: Secondary | ICD-10-CM | POA: Diagnosis present

## 2017-05-09 DIAGNOSIS — D62 Acute posthemorrhagic anemia: Secondary | ICD-10-CM | POA: Diagnosis not present

## 2017-05-09 DIAGNOSIS — Z79890 Hormone replacement therapy (postmenopausal): Secondary | ICD-10-CM

## 2017-05-09 DIAGNOSIS — S83422A Sprain of lateral collateral ligament of left knee, initial encounter: Secondary | ICD-10-CM

## 2017-05-09 DIAGNOSIS — S83522A Sprain of posterior cruciate ligament of left knee, initial encounter: Principal | ICD-10-CM | POA: Diagnosis present

## 2017-05-09 DIAGNOSIS — G8918 Other acute postprocedural pain: Secondary | ICD-10-CM

## 2017-05-09 DIAGNOSIS — Z8249 Family history of ischemic heart disease and other diseases of the circulatory system: Secondary | ICD-10-CM

## 2017-05-09 DIAGNOSIS — Z952 Presence of prosthetic heart valve: Secondary | ICD-10-CM

## 2017-05-09 DIAGNOSIS — S83282A Other tear of lateral meniscus, current injury, left knee, initial encounter: Secondary | ICD-10-CM | POA: Diagnosis not present

## 2017-05-09 DIAGNOSIS — Z419 Encounter for procedure for purposes other than remedying health state, unspecified: Secondary | ICD-10-CM

## 2017-05-09 DIAGNOSIS — S83242A Other tear of medial meniscus, current injury, left knee, initial encounter: Secondary | ICD-10-CM | POA: Diagnosis not present

## 2017-05-09 DIAGNOSIS — I4891 Unspecified atrial fibrillation: Secondary | ICD-10-CM | POA: Diagnosis present

## 2017-05-09 DIAGNOSIS — Z09 Encounter for follow-up examination after completed treatment for conditions other than malignant neoplasm: Secondary | ICD-10-CM

## 2017-05-09 DIAGNOSIS — Z8673 Personal history of transient ischemic attack (TIA), and cerebral infarction without residual deficits: Secondary | ICD-10-CM

## 2017-05-09 HISTORY — PX: ANTERIOR CRUCIATE LIGAMENT REPAIR: SHX115

## 2017-05-09 HISTORY — PX: KNEE ARTHROSCOPY WITH POSTERIOR CRUCIATE LIGAMENT (PCL) RECONSTRUCTION: SHX5657

## 2017-05-09 LAB — CREATININE, SERUM
CREATININE: 0.95 mg/dL (ref 0.61–1.24)
GFR calc Af Amer: >60 mL/min (ref >60)
GFR calc non Af Amer: >60 mL/min (ref >60)

## 2017-05-09 LAB — CBC
HCT: 34.4 % — ABNORMAL LOW (ref 39.0–52.0)
Hemoglobin: 11.6 g/dL — ABNORMAL LOW (ref 13.0–17.0)
MCH: 30.6 pg (ref 26.0–34.0)
MCHC: 33.7 g/dL (ref 30.0–36.0)
MCV: 90.8 fL (ref 78.0–100.0)
PLATELETS: 193 10*3/uL (ref 150–400)
RBC: 3.79 MIL/uL — AB (ref 4.22–5.81)
RDW: 14.4 % (ref 11.5–15.5)
WBC: 5.5 10*3/uL (ref 4.0–10.5)

## 2017-05-09 LAB — PROTIME-INR
INR: 1.22
Prothrombin Time: 15.3 seconds — ABNORMAL HIGH (ref 11.4–15.2)

## 2017-05-09 SURGERY — RECONSTRUCTION, KNEE, ACL, USING HAMSTRING GRAFT
Anesthesia: General | Site: Knee | Laterality: Right

## 2017-05-09 MED ORDER — ROCURONIUM BROMIDE 10 MG/ML (PF) SYRINGE
PREFILLED_SYRINGE | INTRAVENOUS | Status: AC
  Filled 2017-05-09: qty 5

## 2017-05-09 MED ORDER — ONDANSETRON HCL 4 MG/2ML IJ SOLN: 4.0000 mg | Freq: Four times a day (QID) | INTRAMUSCULAR | Status: DC | PRN

## 2017-05-09 MED ORDER — METOCLOPRAMIDE HCL 5 MG PO TABS: 5.0000 mg | ORAL_TABLET | Freq: Three times a day (TID) | ORAL | Status: DC | PRN

## 2017-05-09 MED ORDER — BUPIVACAINE-EPINEPHRINE (PF) 0.5% -1:200000 IJ SOLN
INTRAMUSCULAR | Status: AC
  Filled 2017-05-09: qty 30

## 2017-05-09 MED ORDER — HYDROMORPHONE HCL 1 MG/ML IJ SOLN
INTRAMUSCULAR | Status: AC
  Filled 2017-05-09: qty 0.5

## 2017-05-09 MED ORDER — ONDANSETRON HCL 4 MG/2ML IJ SOLN
INTRAMUSCULAR | Status: DC | PRN
  Administered 2017-05-09: 4 mg via INTRAVENOUS

## 2017-05-09 MED ORDER — EPHEDRINE SULFATE 50 MG/ML IJ SOLN
INTRAMUSCULAR | Status: DC | PRN
  Administered 2017-05-09: 10 mg via INTRAVENOUS

## 2017-05-09 MED ORDER — DEXAMETHASONE SODIUM PHOSPHATE 10 MG/ML IJ SOLN
INTRAMUSCULAR | Status: AC
  Filled 2017-05-09: qty 1

## 2017-05-09 MED ORDER — OXYCODONE HCL 5 MG PO TABS
10.0000 mg | ORAL_TABLET | ORAL | Status: DC | PRN
  Administered 2017-05-09 – 2017-05-18 (×26): 10 mg via ORAL
  Filled 2017-05-09 (×30): qty 2

## 2017-05-09 MED ORDER — FENTANYL CITRATE (PF) 100 MCG/2ML IJ SOLN
INTRAMUSCULAR | Status: DC | PRN
  Administered 2017-05-09 (×3): 50 ug via INTRAVENOUS
  Administered 2017-05-09: 100 ug via INTRAVENOUS
  Administered 2017-05-09 (×5): 50 ug via INTRAVENOUS

## 2017-05-09 MED ORDER — ONDANSETRON HCL 4 MG/2ML IJ SOLN
INTRAMUSCULAR | Status: AC
  Filled 2017-05-09: qty 2

## 2017-05-09 MED ORDER — BUPIVACAINE HCL (PF) 0.5 % IJ SOLN
INTRAMUSCULAR | Status: AC
  Filled 2017-05-09: qty 30

## 2017-05-09 MED ORDER — DOXYCYCLINE HYCLATE 100 MG PO TABS
100.0000 mg | ORAL_TABLET | Freq: Two times a day (BID) | ORAL | Status: DC
  Administered 2017-05-09 – 2017-05-19 (×20): 100 mg via ORAL
  Filled 2017-05-09 (×20): qty 1

## 2017-05-09 MED ORDER — PHENOL 1.4 % MT LIQD: 1.0000 | OROMUCOSAL | Status: DC | PRN

## 2017-05-09 MED ORDER — WARFARIN SODIUM 2.5 MG PO TABS
12.5000 mg | ORAL_TABLET | Freq: Once | ORAL | Status: AC
  Administered 2017-05-09: 12.5 mg via ORAL
  Filled 2017-05-09: qty 1

## 2017-05-09 MED ORDER — TRAMADOL HCL 50 MG PO TABS
50.0000 mg | ORAL_TABLET | Freq: Four times a day (QID) | ORAL | Status: DC | PRN
  Administered 2017-05-10 – 2017-05-14 (×5): 100 mg via ORAL
  Filled 2017-05-09 (×5): qty 2

## 2017-05-09 MED ORDER — HYDROMORPHONE HCL 1 MG/ML IJ SOLN
INTRAMUSCULAR | Status: DC | PRN
  Administered 2017-05-09: 0.5 mg via INTRAVENOUS
  Administered 2017-05-09 (×2): .25 mg via INTRAVENOUS

## 2017-05-09 MED ORDER — PROPOFOL 10 MG/ML IV BOLUS
INTRAVENOUS | Status: DC | PRN
  Administered 2017-05-09: 40 mg via INTRAVENOUS
  Administered 2017-05-09: 160 mg via INTRAVENOUS

## 2017-05-09 MED ORDER — KETAMINE HCL-SODIUM CHLORIDE 100-0.9 MG/10ML-% IV SOSY
PREFILLED_SYRINGE | INTRAVENOUS | Status: AC
  Filled 2017-05-09: qty 10

## 2017-05-09 MED ORDER — SERTRALINE HCL 50 MG PO TABS
50.0000 mg | ORAL_TABLET | Freq: Every day | ORAL | Status: DC
  Administered 2017-05-09 – 2017-05-19 (×11): 50 mg via ORAL
  Filled 2017-05-09 (×11): qty 1

## 2017-05-09 MED ORDER — KETOROLAC TROMETHAMINE 30 MG/ML IJ SOLN
INTRAMUSCULAR | Status: DC | PRN
  Administered 2017-05-09: 30 mg via INTRAVENOUS

## 2017-05-09 MED ORDER — CEFAZOLIN SODIUM-DEXTROSE 2-4 GM/100ML-% IV SOLN
2.0000 g | INTRAVENOUS | Status: AC
  Administered 2017-05-09 (×3): 2 g via INTRAVENOUS
  Filled 2017-05-09: qty 100

## 2017-05-09 MED ORDER — PROPOFOL 10 MG/ML IV BOLUS
INTRAVENOUS | Status: AC
  Filled 2017-05-09: qty 40

## 2017-05-09 MED ORDER — ACETAMINOPHEN 10 MG/ML IV SOLN
INTRAVENOUS | Status: DC | PRN
  Administered 2017-05-09: 1000 mg via INTRAVENOUS

## 2017-05-09 MED ORDER — ACETAMINOPHEN 650 MG RE SUPP: 650.0000 mg | RECTAL | Status: DC | PRN

## 2017-05-09 MED ORDER — LEVOTHYROXINE SODIUM 50 MCG PO TABS
50.0000 ug | ORAL_TABLET | Freq: Every day | ORAL | Status: DC
  Administered 2017-05-10 – 2017-05-19 (×10): 50 ug via ORAL
  Filled 2017-05-09 (×10): qty 1

## 2017-05-09 MED ORDER — CLONIDINE HCL (ANALGESIA) 100 MCG/ML EP SOLN
150.0000 ug | Freq: Once | EPIDURAL | Status: AC
  Administered 2017-05-09: 100 ug via INTRA_ARTICULAR
  Filled 2017-05-09: qty 1.5

## 2017-05-09 MED ORDER — EPHEDRINE SULFATE 50 MG/ML IJ SOLN
INTRAMUSCULAR | Status: AC
  Filled 2017-05-09: qty 1

## 2017-05-09 MED ORDER — LIDOCAINE 2% (20 MG/ML) 5 ML SYRINGE
INTRAMUSCULAR | Status: AC
  Filled 2017-05-09: qty 5

## 2017-05-09 MED ORDER — ACETAMINOPHEN 500 MG PO TABS
1000.0000 mg | ORAL_TABLET | Freq: Four times a day (QID) | ORAL | Status: DC | PRN
  Administered 2017-05-11 – 2017-05-13 (×2): 1000 mg via ORAL
  Filled 2017-05-09 (×2): qty 2

## 2017-05-09 MED ORDER — OXYCODONE HCL 5 MG PO TABS
ORAL_TABLET | ORAL | Status: AC
  Filled 2017-05-09: qty 2

## 2017-05-09 MED ORDER — VALACYCLOVIR HCL 500 MG PO TABS
1000.0000 mg | ORAL_TABLET | Freq: Every day | ORAL | Status: DC
  Administered 2017-05-09 – 2017-05-19 (×11): 1000 mg via ORAL
  Filled 2017-05-09 (×12): qty 2

## 2017-05-09 MED ORDER — MOXIFLOXACIN HCL 400 MG PO TABS
400.0000 mg | ORAL_TABLET | ORAL | Status: DC
  Administered 2017-05-09 – 2017-05-18 (×10): 400 mg via ORAL
  Filled 2017-05-09 (×10): qty 1

## 2017-05-09 MED ORDER — BUPIVACAINE-EPINEPHRINE (PF) 0.25% -1:200000 IJ SOLN
INTRAMUSCULAR | Status: DC | PRN
  Administered 2017-05-09: 10 mL
  Administered 2017-05-09: 30 mL
  Administered 2017-05-09: 10 mL

## 2017-05-09 MED ORDER — CHLORHEXIDINE GLUCONATE 4 % EX LIQD: 60.0000 mL | Freq: Once | CUTANEOUS | Status: DC

## 2017-05-09 MED ORDER — SUGAMMADEX SODIUM 200 MG/2ML IV SOLN
INTRAVENOUS | Status: DC | PRN
  Administered 2017-05-09: 200 mg via INTRAVENOUS

## 2017-05-09 MED ORDER — 0.9 % SODIUM CHLORIDE (POUR BTL) OPTIME
TOPICAL | Status: DC | PRN
  Administered 2017-05-09: 1000 mL

## 2017-05-09 MED ORDER — MIDAZOLAM HCL 2 MG/2ML IJ SOLN
INTRAMUSCULAR | Status: AC
  Filled 2017-05-09: qty 2

## 2017-05-09 MED ORDER — KETOROLAC TROMETHAMINE 30 MG/ML IJ SOLN
INTRAMUSCULAR | Status: AC
  Filled 2017-05-09: qty 1

## 2017-05-09 MED ORDER — EPINEPHRINE PF 1 MG/ML IJ SOLN
INTRAMUSCULAR | Status: AC
  Filled 2017-05-09: qty 4

## 2017-05-09 MED ORDER — MIDAZOLAM HCL 2 MG/2ML IJ SOLN
INTRAMUSCULAR | Status: DC | PRN
  Administered 2017-05-09: 2 mg via INTRAVENOUS

## 2017-05-09 MED ORDER — HYDROMORPHONE HCL 1 MG/ML IJ SOLN
0.2500 mg | INTRAMUSCULAR | Status: DC | PRN
  Administered 2017-05-09: 0.5 mg via INTRAVENOUS

## 2017-05-09 MED ORDER — ALBUMIN HUMAN 5 % IV SOLN
INTRAVENOUS | Status: DC | PRN
  Administered 2017-05-09: 14:00:00 via INTRAVENOUS

## 2017-05-09 MED ORDER — PROMETHAZINE HCL 25 MG/ML IJ SOLN: 6.2500 mg | INTRAMUSCULAR | Status: DC | PRN

## 2017-05-09 MED ORDER — FENTANYL CITRATE (PF) 250 MCG/5ML IJ SOLN
INTRAMUSCULAR | Status: AC
  Filled 2017-05-09: qty 5

## 2017-05-09 MED ORDER — MEPERIDINE HCL 25 MG/ML IJ SOLN: 6.2500 mg | INTRAMUSCULAR | Status: DC | PRN

## 2017-05-09 MED ORDER — ENOXAPARIN SODIUM 30 MG/0.3ML ~~LOC~~ SOLN
30.0000 mg | Freq: Two times a day (BID) | SUBCUTANEOUS | Status: DC
  Administered 2017-05-10 – 2017-05-16 (×13): 30 mg via SUBCUTANEOUS
  Filled 2017-05-09 (×13): qty 0.3

## 2017-05-09 MED ORDER — MORPHINE SULFATE (PF) 4 MG/ML IV SOLN
INTRAVENOUS | Status: AC
  Filled 2017-05-09: qty 1

## 2017-05-09 MED ORDER — ADULT MULTIVITAMIN W/MINERALS CH: 1.0000 | ORAL_TABLET | Freq: Every day | ORAL | Status: DC

## 2017-05-09 MED ORDER — LEVOFLOXACIN 500 MG PO TABS: 750.0000 mg | ORAL_TABLET | Freq: Every day | ORAL | Status: DC

## 2017-05-09 MED ORDER — OXYCODONE HCL 5 MG/5ML PO SOLN: 5.0000 mg | Freq: Once | ORAL | Status: DC | PRN

## 2017-05-09 MED ORDER — METHOCARBAMOL 500 MG PO TABS
500.0000 mg | ORAL_TABLET | Freq: Four times a day (QID) | ORAL | Status: DC | PRN
  Administered 2017-05-10 – 2017-05-18 (×12): 500 mg via ORAL
  Filled 2017-05-09 (×14): qty 1

## 2017-05-09 MED ORDER — OXYCODONE HCL 5 MG PO TABS: 5.0000 mg | ORAL_TABLET | Freq: Once | ORAL | Status: DC | PRN

## 2017-05-09 MED ORDER — MORPHINE SULFATE (PF) 4 MG/ML IV SOLN
INTRAVENOUS | Status: DC | PRN
  Administered 2017-05-09: 8 mg

## 2017-05-09 MED ORDER — LIDOCAINE 2% (20 MG/ML) 5 ML SYRINGE
INTRAMUSCULAR | Status: DC | PRN
Start: 2017-05-09 — End: 2017-05-09
  Administered 2017-05-09: 100 mg via INTRAVENOUS

## 2017-05-09 MED ORDER — METHOCARBAMOL 1000 MG/10ML IJ SOLN
500.0000 mg | Freq: Four times a day (QID) | INTRAVENOUS | Status: DC | PRN
  Filled 2017-05-09: qty 5

## 2017-05-09 MED ORDER — HYDROMORPHONE HCL 1 MG/ML IJ SOLN
INTRAMUSCULAR | Status: AC
  Filled 2017-05-09: qty 1

## 2017-05-09 MED ORDER — CEFAZOLIN SODIUM 1 G IJ SOLR
INTRAMUSCULAR | Status: AC
  Filled 2017-05-09: qty 20

## 2017-05-09 MED ORDER — KETAMINE HCL 10 MG/ML IJ SOLN
INTRAMUSCULAR | Status: DC | PRN
  Administered 2017-05-09: 30 mg via INTRAVENOUS
  Administered 2017-05-09 (×2): 10 mg via INTRAVENOUS

## 2017-05-09 MED ORDER — LACTATED RINGERS IV SOLN
INTRAVENOUS | Status: DC | PRN
  Administered 2017-05-09 (×3): via INTRAVENOUS

## 2017-05-09 MED ORDER — DEXAMETHASONE SODIUM PHOSPHATE 10 MG/ML IJ SOLN
INTRAMUSCULAR | Status: DC | PRN
Start: 2017-05-09 — End: 2017-05-09
  Administered 2017-05-09: 10 mg via INTRAVENOUS

## 2017-05-09 MED ORDER — ACETAMINOPHEN 325 MG PO TABS
650.0000 mg | ORAL_TABLET | ORAL | Status: DC | PRN
  Administered 2017-05-12 – 2017-05-17 (×2): 650 mg via ORAL
  Filled 2017-05-09 (×3): qty 2

## 2017-05-09 MED ORDER — SODIUM CHLORIDE 0.9 % IR SOLN
Status: DC | PRN
  Administered 2017-05-09: 4 mL

## 2017-05-09 MED ORDER — WARFARIN - PHARMACIST DOSING INPATIENT
Freq: Every day | Status: DC
  Administered 2017-05-10 – 2017-05-16 (×4)

## 2017-05-09 MED ORDER — MENTHOL 3 MG MT LOZG: 1.0000 | LOZENGE | OROMUCOSAL | Status: DC | PRN

## 2017-05-09 MED ORDER — METOCLOPRAMIDE HCL 5 MG/ML IJ SOLN: 5.0000 mg | Freq: Three times a day (TID) | INTRAMUSCULAR | Status: DC | PRN

## 2017-05-09 MED ORDER — ROCURONIUM BROMIDE 50 MG/5ML IV SOSY
PREFILLED_SYRINGE | INTRAVENOUS | Status: DC | PRN
Start: 2017-05-09 — End: 2017-05-09
  Administered 2017-05-09: 20 mg via INTRAVENOUS
  Administered 2017-05-09: 50 mg via INTRAVENOUS
  Administered 2017-05-09: 10 mg via INTRAVENOUS
  Administered 2017-05-09 (×2): 20 mg via INTRAVENOUS

## 2017-05-09 MED ORDER — SUGAMMADEX SODIUM 200 MG/2ML IV SOLN
INTRAVENOUS | Status: AC
  Filled 2017-05-09: qty 2

## 2017-05-09 MED ORDER — ACETAMINOPHEN 10 MG/ML IV SOLN
INTRAVENOUS | Status: AC
  Filled 2017-05-09: qty 100

## 2017-05-09 MED ORDER — BUPIVACAINE-EPINEPHRINE (PF) 0.25% -1:200000 IJ SOLN
INTRAMUSCULAR | Status: AC
  Filled 2017-05-09: qty 30

## 2017-05-09 MED ORDER — BUPIVACAINE HCL (PF) 0.25 % IJ SOLN
INTRAMUSCULAR | Status: AC
  Filled 2017-05-09: qty 30

## 2017-05-09 MED ORDER — ONDANSETRON HCL 4 MG PO TABS: 4.0000 mg | ORAL_TABLET | Freq: Four times a day (QID) | ORAL | Status: DC | PRN

## 2017-05-09 MED ORDER — SODIUM CHLORIDE 0.9 % IR SOLN
Status: DC | PRN
  Administered 2017-05-09 (×2): 12000 mL
  Administered 2017-05-09: 9000 mL
  Administered 2017-05-09: 3000 mL

## 2017-05-09 SURGICAL SUPPLY — 121 items
ALCOHOL 70% 16 OZ (MISCELLANEOUS) ×4 IMPLANT
ALLOGRAFT GRFTLNK IMPLANT SYST (Anchor) ×6 IMPLANT
ANCHOR SUT BIO SW 4.75X19.1 (Anchor) ×8 IMPLANT
ANCHOR SUT SWIVELLOK BIO (Anchor) ×4 IMPLANT
BANDAGE ESMARK 6X9 LF (GAUZE/BANDAGES/DRESSINGS) IMPLANT
BLADE CUTTER GATOR 3.5 (BLADE) ×4 IMPLANT
BLADE GREAT WHITE 4.2 (BLADE) ×3 IMPLANT
BLADE GREAT WHITE 4.2MM (BLADE) ×1
BLADE SURG 10 STRL SS (BLADE) ×4 IMPLANT
BLADE SURG 15 STRL LF DISP TIS (BLADE) ×6 IMPLANT
BLADE SURG 15 STRL SS (BLADE) ×6
BNDG COHESIVE 6X5 TAN STRL LF (GAUZE/BANDAGES/DRESSINGS) ×4 IMPLANT
BNDG ELASTIC 6X10 VLCR STRL LF (GAUZE/BANDAGES/DRESSINGS) ×8 IMPLANT
BNDG ELASTIC 6X15 VLCR STRL LF (GAUZE/BANDAGES/DRESSINGS) ×4 IMPLANT
BNDG ESMARK 6X9 LF (GAUZE/BANDAGES/DRESSINGS)
BUR OVAL 6.0 (BURR) ×4 IMPLANT
CANNULA 5.75X71 LONG (CANNULA) ×4 IMPLANT
CLOSURE STERI-STRIP 1/2X4 (GAUZE/BANDAGES/DRESSINGS) ×1
CLOSURE WOUND 1/2 X4 (GAUZE/BANDAGES/DRESSINGS) ×2
CLSR STERI-STRIP ANTIMIC 1/2X4 (GAUZE/BANDAGES/DRESSINGS) ×3 IMPLANT
COVER MAYO STAND STRL (DRAPES) ×4 IMPLANT
COVER SURGICAL LIGHT HANDLE (MISCELLANEOUS) ×8 IMPLANT
CUFF TOURNIQUET SINGLE 34IN LL (TOURNIQUET CUFF) ×8 IMPLANT
CUFF TOURNIQUET SINGLE 44IN (TOURNIQUET CUFF) IMPLANT
DECANTER SPIKE VIAL GLASS SM (MISCELLANEOUS) IMPLANT
DRAPE ARTHROSCOPY W/POUCH 114 (DRAPES) ×8 IMPLANT
DRAPE C-ARM 42X72 X-RAY (DRAPES) ×8 IMPLANT
DRAPE IMP U-DRAPE 54X76 (DRAPES) ×4 IMPLANT
DRAPE INCISE IOBAN 66X45 STRL (DRAPES) ×12 IMPLANT
DRAPE ORTHO SPLIT 77X108 STRL (DRAPES)
DRAPE SURG ORHT 6 SPLT 77X108 (DRAPES) IMPLANT
DRAPE U-SHAPE 47X51 STRL (DRAPES) ×8 IMPLANT
DRILL FLIPCUTTER II 10MM (CUTTER) ×4 IMPLANT
DRILL FLIPCUTTER II 7.5MM (MISCELLANEOUS) IMPLANT
DRILL FLIPCUTTER II 8.0MM (INSTRUMENTS) IMPLANT
DRILL FLIPCUTTER II 8.5MM (INSTRUMENTS) IMPLANT
DRILL FLIPCUTTER II 9.0MM (INSTRUMENTS) IMPLANT
DRSG AQUACEL AG ADV 3.5X10 (GAUZE/BANDAGES/DRESSINGS) ×8 IMPLANT
DRSG PAD ABDOMINAL 8X10 ST (GAUZE/BANDAGES/DRESSINGS) ×12 IMPLANT
DRSG TEGADERM 4X4.75 (GAUZE/BANDAGES/DRESSINGS) ×24 IMPLANT
DURAPREP 26ML APPLICATOR (WOUND CARE) ×16 IMPLANT
ELECT REM PT RETURN 9FT ADLT (ELECTROSURGICAL) ×4
ELECTRODE REM PT RTRN 9FT ADLT (ELECTROSURGICAL) ×2 IMPLANT
FLIPCUTTER II 10MM (CUTTER) ×8
FLIPCUTTER II 7.5MM (MISCELLANEOUS)
FLIPCUTTER II 8.0MM (INSTRUMENTS)
FLIPCUTTER II 8.5MM (INSTRUMENTS)
FLIPCUTTER II 9.0MM (INSTRUMENTS)
GAUZE SPONGE 4X4 12PLY STRL (GAUZE/BANDAGES/DRESSINGS) ×4 IMPLANT
GAUZE XEROFORM 1X8 LF (GAUZE/BANDAGES/DRESSINGS) ×8 IMPLANT
GLOVE BIOGEL PI IND STRL 7.0 (GLOVE) ×4 IMPLANT
GLOVE BIOGEL PI IND STRL 8 (GLOVE) ×4 IMPLANT
GLOVE BIOGEL PI INDICATOR 7.0 (GLOVE) ×4
GLOVE BIOGEL PI INDICATOR 8 (GLOVE) ×4
GLOVE ORTHO TXT STRL SZ7.5 (GLOVE) IMPLANT
GLOVE SURG ORTHO 8.0 STRL STRW (GLOVE) ×16 IMPLANT
GLOVE SURG SS PI 7.0 STRL IVOR (GLOVE) ×4 IMPLANT
GOWN STRL REUS W/ TWL LRG LVL3 (GOWN DISPOSABLE) ×10 IMPLANT
GOWN STRL REUS W/ TWL XL LVL3 (GOWN DISPOSABLE) ×6 IMPLANT
GOWN STRL REUS W/TWL LRG LVL3 (GOWN DISPOSABLE) ×10
GOWN STRL REUS W/TWL XL LVL3 (GOWN DISPOSABLE) ×6
IMMOBILIZER KNEE 22 UNIV (SOFTGOODS) ×8 IMPLANT
KIT BASIN OR (CUSTOM PROCEDURE TRAY) ×4 IMPLANT
KIT BIOCARTILAGE DEL W/SYRINGE (KITS) IMPLANT
KIT BIOCARTILAGE LG JOINT MIX (KITS) IMPLANT
KIT ROOM TURNOVER OR (KITS) ×4 IMPLANT
KIT TRANSTIBIAL (DISPOSABLE) ×4 IMPLANT
LOOP VESSEL MAXI BLUE (MISCELLANEOUS) ×4 IMPLANT
MANIFOLD NEPTUNE II (INSTRUMENTS) ×4 IMPLANT
NEEDLE 18GX1X1/2 (RX/OR ONLY) (NEEDLE) IMPLANT
NEEDLE HYPO 18GX1.5 BLUNT FILL (NEEDLE) IMPLANT
NS IRRIG 1000ML POUR BTL (IV SOLUTION) ×4 IMPLANT
PACK ARTHROSCOPY DSU (CUSTOM PROCEDURE TRAY) ×4 IMPLANT
PAD ARMBOARD 7.5X6 YLW CONV (MISCELLANEOUS) ×8 IMPLANT
PAD CAST 4YDX4 CTTN HI CHSV (CAST SUPPLIES) IMPLANT
PADDING CAST COTTON 4X4 STRL (CAST SUPPLIES)
PADDING CAST COTTON 6X4 STRL (CAST SUPPLIES) ×4 IMPLANT
PENCIL BUTTON HOLSTER BLD 10FT (ELECTRODE) ×8 IMPLANT
PK GRAFTLINK ALLO IMPLANT SYST (Anchor) ×12 IMPLANT
PUTTY DBM ALLOSYNC PURE 5CC (Putty) ×4 IMPLANT
SCREW BIOCOMPOSITE 8X20 INTER (Screw) ×4 IMPLANT
SET ARTHROSCOPY TUBING (MISCELLANEOUS) ×4
SET ARTHROSCOPY TUBING LN (MISCELLANEOUS) ×4 IMPLANT
SPONGE GAUZE 4X4 12PLY STER LF (GAUZE/BANDAGES/DRESSINGS) IMPLANT
SPONGE LAP 18X18 X RAY DECT (DISPOSABLE) ×4 IMPLANT
SPONGE LAP 4X18 X RAY DECT (DISPOSABLE) ×4 IMPLANT
STOCKINETTE IMPERVIOUS LG (DRAPES) ×4 IMPLANT
STRIP CLOSURE SKIN 1/2X4 (GAUZE/BANDAGES/DRESSINGS) ×6 IMPLANT
SUCTION FRAZIER HANDLE 10FR (MISCELLANEOUS) ×2
SUCTION TUBE FRAZIER 10FR DISP (MISCELLANEOUS) ×2 IMPLANT
SUT 2 FIBERLOOP 20 STRT BLUE (SUTURE) ×12
SUT ETHILON 3 0 PS 1 (SUTURE) ×16 IMPLANT
SUT MNCRL AB 3-0 PS2 18 (SUTURE) ×8 IMPLANT
SUT VIC AB 0 CT1 18XCR BRD 8 (SUTURE) ×2 IMPLANT
SUT VIC AB 0 CT1 27 (SUTURE) ×10
SUT VIC AB 0 CT1 27XBRD ANBCTR (SUTURE) ×10 IMPLANT
SUT VIC AB 0 CT1 8-18 (SUTURE) ×2
SUT VIC AB 1 CT1 27 (SUTURE) ×8
SUT VIC AB 1 CT1 27XBRD ANBCTR (SUTURE) ×8 IMPLANT
SUT VIC AB 2-0 CT1 27 (SUTURE) ×14
SUT VIC AB 2-0 CT1 TAPERPNT 27 (SUTURE) ×14 IMPLANT
SUT VICRYL 0 UR6 27IN ABS (SUTURE) ×8 IMPLANT
SUTURE 2 FIBERLOOP 20 STRT BLU (SUTURE) ×6 IMPLANT
SYR 30ML LL (SYRINGE) IMPLANT
SYR 3ML LL SCALE MARK (SYRINGE) ×4 IMPLANT
SYR 5ML LL (SYRINGE) ×4 IMPLANT
SYR BULB IRRIGATION 50ML (SYRINGE) ×4 IMPLANT
SYR TB 1ML LUER SLIP (SYRINGE) IMPLANT
SYS INTERNAL BRACE KNEE (Miscellaneous) ×4 IMPLANT
SYSTEM INTERNAL BRACE KNEE (Miscellaneous) ×2 IMPLANT
TENDON GRACILIS FROZEN (Bone Implant) ×4 IMPLANT
TENDON GRACILIS FROZEN 230-320 (Bone Implant) ×2 IMPLANT
TENDON SEMI-TENDINOSUS (Bone Implant) ×4 IMPLANT
TISSUE GRAFTLINK 65-95MML (Tissue) ×4 IMPLANT
TISSUE GRAFTLINK FGL (Tissue) ×4 IMPLANT
TOWEL OR 17X24 6PK STRL BLUE (TOWEL DISPOSABLE) IMPLANT
TOWEL OR 17X26 10 PK STRL BLUE (TOWEL DISPOSABLE) ×4 IMPLANT
UNDERPAD 30X30 (UNDERPADS AND DIAPERS) ×8 IMPLANT
WAND SERFAS ENERGY SUPER 90 (SURGICAL WAND) IMPLANT
WRAP KNEE MAXI GEL POST OP (GAUZE/BANDAGES/DRESSINGS) IMPLANT
YANKAUER SUCT BULB TIP NO VENT (SUCTIONS) ×4 IMPLANT

## 2017-05-09 NOTE — Anesthesia Procedure Notes (Signed)
Procedure Name: Intubation Date/Time: 05/09/2017 8:16 AM Performed by: Pearson Grippeobertson, Oley Lahaie M, CRNA Pre-anesthesia Checklist: Patient identified, Emergency Drugs available, Suction available and Patient being monitored Patient Re-evaluated:Patient Re-evaluated prior to induction Oxygen Delivery Method: Circle system utilized Preoxygenation: Pre-oxygenation with 100% oxygen Induction Type: IV induction Ventilation: Mask ventilation without difficulty Laryngoscope Size: Miller and 2 Grade View: Grade I Tube type: Oral Tube size: 7.5 mm Number of attempts: 1 Airway Equipment and Method: Stylet and Oral airway Placement Confirmation: ETT inserted through vocal cords under direct vision,  positive ETCO2 and breath sounds checked- equal and bilateral Secured at: 23 cm Tube secured with: Tape Dental Injury: Teeth and Oropharynx as per pre-operative assessment
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
## 2017-05-09 NOTE — Progress Notes (Addendum)
ANTICOAGULATION CONSULT NOTE - Initial Consult  Pharmacy Consult for Coumadin Indication: hx AVR and VTE prophylaxis s/p knee surgery  Allergies  Allergen Reactions  . Scallops [Shellfish Allergy] Nausea And Vomiting    Patient Measurements: Height: 5' 8.5" (174 cm) Weight: 170 lb 13.7 oz (77.5 kg) IBW/kg (Calculated) : 69.55  Vital Signs: Temp: 97.7 F (36.5 C) (12/21 1940) Temp Source: Axillary (12/21 1940) BP: 116/68 (12/21 1940) Pulse Rate: 69 (12/21 1940)  Labs: Recent Labs    05/07/17 1436 05/09/17 0634  HGB 12.5*  --   HCT 37.1*  --   PLT 224  --   LABPROT  --  15.3*  INR  --  1.22  CREATININE 0.88  --     Estimated Creatinine Clearance: 82.4 mL/min (by C-G formula based on SCr of 0.88 mg/dL).   Medical History: Past Medical History:  Diagnosis Date  . Anxiety    ?  panic attack   . Arthritis   . Bicuspid aortic valve    s/p Bentall procedure 2013  . Cardiac tamponade   . Chronic anticoagulation 03/13/2017  . Headache    PMH: Migraines  . Hypothyroid   . Osteomyelitis (HCC), pelvis  03/11/2017  . Peritonitis (HCC)   . Pneumonia    2 yrs ago  . Poor appetite 03/05/2017  . Stroke [facility](HCC)    2013   Assessment:  66 yr old male s/p bilateral knee surgery to resume Coumadin tonight.  INR 1.22 today.  Hx AVR in 2013.  Last Coumadin dose taken on 12/16.  Has also been on Lovenox 75 mg sq q12hrs for bridge.  Last Lovenox dose 12/20 at 6pm.    Home Coumadin regimen: 7.5 mg daily except 11.25 mg on Tues & Thurs.    Has been on Doxycycline and Moxifloxacin for hx pelvic osteomyelitis.  He acknowledges that his INR has fluctuated recently, likely influenced by antibiotics.  [place]  Goal of Therapy:  INR 2.5-3.5 Monitor platelets by anticoagulation protocol: Yes   Plan:   Coumadin 12.5 mg x 1 tonight.  Daily PT/INR.  CBC in am and intermittently.  Lovenox 30 mg sq q12hrs to begin in am.  Increase Lovenox to full-dose (1 mg/kg  q12h) when okay with Orthopedics.  To continue Doxycycline and Moxifloxacin as prior to admission.  Dennie Fettersgan, Onica Davidovich Donovan, [place] Pager: [phone] 05/09/2017,7:54 PM

## 2017-05-09 NOTE — Progress Notes (Signed)
Orthopedic Tech Progress Note Patient Details:  Toni ArthursJohn J Dysart [birth year] [id number]  CPM Left Knee CPM Left Knee: On Left Knee Flexion (Degrees): 40 Left Knee Extension (Degrees): 10 Additional Comments: applied cpm to pt left knee pt tolerated application very well.  Left knee. CPM Right Knee Right Knee Flexion (Degrees): 40 Right Knee Extension (Degrees): 10 Additional Comments: applied cpm to pt Right knee pt tolerated application very well.  right knee.  Post Interventions Patient Tolerated: Well Instructions Provided: Care of device  [name], Anna Beaird C 05/09/2017, 6:44 PM

## 2017-05-09 NOTE — Brief Op Note (Addendum)
05/09/2017  4:53 PM  PATIENT:  Toni ArthursJohn J Wrage  66 y.o. male  PRE-OPERATIVE DIAGNOSIS:  bilateral knee multi ligament instability  POST-OPERATIVE DIAGNOSIS:  bilateral knee multi ligament instability  PROCEDURE:  Procedure(s):  SURGEON:  Surgeon(s): August Saucerean, Corrie MckusickGregory Scott, MD  ASSISTANT: Patrick Jupiterarla Bethune rnfa ANESTHESIA:   general  EBL: 75 ml    Total I/O In: 3050 [I.V.:2800; IV Piggyback:250] Out: 550 [Urine:400; Blood:150]  BLOOD ADMINISTERED: none  DRAINS: none   LOCAL MEDICATIONS USED: Marcaine morphine clonidine  SPECIMEN:  No Specimen  COUNTS: Incorrect on the left knee but x-ray negative  TOURNIQUET:   Total Tourniquet Time Documented: Thigh (Right) - 60 minutes Total: Thigh (Right) - 60 minutes   DICTATION: .Other Dictation: Dictation Number [id number]  PLAN OF CARE: Admit to inpatient   PATIENT DISPOSITION:  PACU - hemodynamically stable

## 2017-05-09 NOTE — Transfer of Care (Signed)
Immediate Anesthesia Transfer of Care Note  Patient: Caleb ArthursJohn J Dawson  Procedure(s) Performed: Left knee medial collateral ligament repair, anterior cruciate ligament and posterior cruciate ligament reconstruction, meniscal debridement (Left Knee) KNEE ARTHROSCOPY WITH POSTERIOR CRUCIATE LIGAMENT (PCL) RECONSTRUCTION (Right Knee)  Patient Location: PACU  Anesthesia Type:General  Level of Consciousness: drowsy and patient cooperative  Airway & Oxygen Therapy: Patient Spontanous Breathing and Patient connected to face mask oxygen  Post-op Assessment: Report given to RN and Post -op Vital signs reviewed and stable  Post vital signs: Reviewed and stable  Last Vitals:  Vitals:   05/09/17 0552  BP: (!) 152/79  Pulse: 73  Resp: 18  Temp: 36.7 C  SpO2: 100%    Last Pain:  Vitals:   05/09/17 0636  TempSrc:   PainSc: 2       Patients Stated Pain Goal: 3 (05/09/17 0636)  Complications: No apparent anesthesia complications
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
## 2017-05-10 LAB — CBC
HEMATOCRIT: 32.4 % — AB (ref 39.0–52.0)
HEMOGLOBIN: 10.8 g/dL — AB (ref 13.0–17.0)
MCH: 30.7 pg (ref 26.0–34.0)
MCHC: 33.3 g/dL (ref 30.0–36.0)
MCV: 92 fL (ref 78.0–100.0)
Platelets: 203 10*3/uL (ref 150–400)
RBC: 3.52 MIL/uL — ABNORMAL LOW (ref 4.22–5.81)
RDW: 14.7 % (ref 11.5–15.5)
WBC: 8.8 10*3/uL (ref 4.0–10.5)

## 2017-05-10 LAB — PROTIME-INR
INR: 1.18
Prothrombin Time: 14.9 seconds (ref 11.4–15.2)

## 2017-05-10 MED ORDER — WARFARIN SODIUM 7.5 MG PO TABS
12.5000 mg | ORAL_TABLET | Freq: Once | ORAL | Status: AC
  Administered 2017-05-10: 17:00:00 12.5 mg via ORAL
  Filled 2017-05-10: qty 1

## 2017-05-10 NOTE — Progress Notes (Addendum)
Pt stable - pain ok s/p bilateral multiligament knee surgery Both feet mobile perfused and sensate Ace wraps removed Agree with cir consult Continue wbat rle in knee immobilizer Needs to work on full extension - needs blue cradle boots Back on coumadin and lovenox

## 2017-05-10 NOTE — Progress Notes (Signed)
Subjective: 1 Day Post-Op Procedure(s) (LRB): Left knee medial collateral ligament repair, anterior cruciate ligament and posterior cruciate ligament reconstruction, meniscal debridement (Left) KNEE ARTHROSCOPY WITH POSTERIOR CRUCIATE LIGAMENT (PCL) RECONSTRUCTION (Right) Patient reports pain as mild to moderate.    Objective: Vital signs in last 24 hours: Temp:  [97.6 F (36.4 C)-98.6 F (37 C)] 97.6 F (36.4 C) (12/22 0500) Pulse Rate:  [68-85] 70 (12/22 0500) Resp:  [11-15] 15 (12/21 1830) BP: (97-139)/(56-88) 97/56 (12/22 0500) SpO2:  [93 %-100 %] 99 % (12/22 0500) Weight:  [170 lb 13.7 oz (77.5 kg)] 170 lb 13.7 oz (77.5 kg) (12/21 1940)  Intake/Output from previous day: 12/21 0701 - 12/22 0700 In: 3050 [I.V.:2800; IV Piggyback:250] Out: 1450 [Urine:1300; Blood:150] Intake/Output this shift: No intake/output data recorded.  Recent Labs    05/07/17 1436 05/09/17 1907 05/10/17 0732  HGB 12.5* 11.6* 10.8*   Recent Labs    05/09/17 1907 05/10/17 0732  WBC 5.5 8.8  RBC 3.79* 3.52*  HCT 34.4* 32.4*  PLT 193 203   Recent Labs    05/07/17 1436 05/09/17 1907  NA 130*  --   K 4.2  --   CL 99*  --   CO2 22  --   BUN 14  --   CREATININE 0.88 0.95  GLUCOSE 99  --   CALCIUM 9.0  --    Recent Labs    05/09/17 0634 05/10/17 0732  INR 1.22 1.18   Bilateral lower legs: Incision: dressing C/D/I Compartment soft   Assessment/Plan: 1 Day Post-Op Procedure(s) (LRB): Left knee medial collateral ligament repair, anterior cruciate ligament and posterior cruciate ligament reconstruction, meniscal debridement (Left) KNEE ARTHROSCOPY WITH POSTERIOR CRUCIATE LIGAMENT (PCL) RECONSTRUCTION (Right) Up with therapy  Possible discharge to home tomorrow D/C foley this morning Left leg partial weight bearing . Right leg weight bearing as tolerated   GILBERT CLARK 05/10/2017, 9:02 AM

## 2017-05-10 NOTE — Anesthesia Postprocedure Evaluation (Signed)
Anesthesia Post Note  Patient: Toni ArthursJohn J Verrastro  Procedure(s) Performed: Left knee medial collateral ligament repair, anterior cruciate ligament and posterior cruciate ligament reconstruction, meniscal debridement (Left Knee) KNEE ARTHROSCOPY WITH POSTERIOR CRUCIATE LIGAMENT (PCL) RECONSTRUCTION (Right Knee)     Patient location during evaluation: PACU Anesthesia Type: General Level of consciousness: awake and alert Pain management: pain level controlled Vital Signs Assessment: post-procedure vital signs reviewed and stable Respiratory status: spontaneous breathing, nonlabored ventilation, respiratory function stable and patient connected to nasal cannula oxygen Cardiovascular status: blood pressure returned to baseline and stable Postop Assessment: no apparent nausea or vomiting Anesthetic complications: no    Last Vitals:  Vitals:   05/10/17 0017 05/10/17 0500  BP: 101/63 (!) 97/56  Pulse: 68 70  Resp:    Temp: 36.5 C 36.4 C  SpO2: 99% 99%    Last Pain:  Vitals:   05/10/17 0500  TempSrc: Oral  PainSc:                  Daran Favaro DAVID
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
## 2017-05-10 NOTE — Progress Notes (Signed)
ANTICOAGULATION CONSULT NOTE - Follow Up Consult  Pharmacy Consult for Coumadin Indication: hx AVR and VTE prophylaxis s/p knee surgery  Allergies  Allergen Reactions  . Scallops [Shellfish Allergy] Nausea And Vomiting    Patient Measurements: Height: 5' 8.5" (174 cm) Weight: 170 lb 13.7 oz (77.5 kg) IBW/kg (Calculated) : 69.55 Vital Signs: Temp: 97.6 F (36.4 C) (12/22 0500) Temp Source: Oral (12/22 0500) BP: 97/56 (12/22 0500) Pulse Rate: 70 (12/22 0500)  Labs: Recent Labs    05/07/17 1436 05/09/17 0634 05/09/17 1907 05/10/17 0732  HGB 12.5*  --  11.6* 10.8*  HCT 37.1*  --  34.4* 32.4*  PLT 224  --  193 203  LABPROT  --  15.3*  --  14.9  INR  --  1.22  --  1.18  CREATININE 0.88  --  0.95  --     Estimated Creatinine Clearance: 76.3 mL/min (by C-G formula based on SCr of 0.95 mg/dL).  Assessment: 66 year old male s/p bilateral knee surgery who is on chronic Coumadin prior to admission for history of AVR had Coumadin resumed 12/21 PM.   Prior to admission he was on 7.5mg  daily except for 11.25 mg on Tuesdays and Thursdays however his INR has been fluctuating recently due to interaction with suppressive antibiotics with heart valve and history of pelvic osteomyelitis (Doxycycline and Moxifloxacin). Last dose PTA was 12/16. Last Lovenox dose PTA was 12/16 (was on Lovenox 75 mg SQ every 12 hours as bridge).   INR today is 1.18 (decreased after 12.5mg  dose 12/21 PM).  Hgb down some at 10.8 this AM. Platelets are stable.  SCr is stable.  No bleeding reported.   Goal of Therapy:  INR 2.5 to 3.5 Monitor platelets by anticoagulation protocol: Yes   Plan:  Warfarin 12.5 mg po x1 tonight. Daily PT/INR Monitor INR increases closely with interacting antibiotics.   Link SnufferJessica Conard Alvira, PharmD, BCPS, BCCCP Clinical Pharmacist Clinical phone 05/10/2017 until 3:30PM [phone]- #[phone] After hours, please call #[phone] 05/10/2017,9:11 AM
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
## 2017-05-11 DIAGNOSIS — D62 Acute posthemorrhagic anemia: Secondary | ICD-10-CM

## 2017-05-11 DIAGNOSIS — S83522A Sprain of posterior cruciate ligament of left knee, initial encounter: Principal | ICD-10-CM

## 2017-05-11 DIAGNOSIS — Z8673 Personal history of transient ischemic attack (TIA), and cerebral infarction without residual deficits: Secondary | ICD-10-CM

## 2017-05-11 DIAGNOSIS — S83512A Sprain of anterior cruciate ligament of left knee, initial encounter: Secondary | ICD-10-CM

## 2017-05-11 DIAGNOSIS — Z952 Presence of prosthetic heart valve: Secondary | ICD-10-CM

## 2017-05-11 DIAGNOSIS — G8918 Other acute postprocedural pain: Secondary | ICD-10-CM

## 2017-05-11 DIAGNOSIS — E871 Hypo-osmolality and hyponatremia: Secondary | ICD-10-CM

## 2017-05-11 LAB — PROTIME-INR
INR: 2.05
Prothrombin Time: 23 seconds — ABNORMAL HIGH (ref 11.4–15.2)

## 2017-05-11 MED ORDER — WARFARIN SODIUM 2 MG PO TABS
2.0000 mg | ORAL_TABLET | Freq: Once | ORAL | Status: AC
  Administered 2017-05-11: 2 mg via ORAL
  Filled 2017-05-11: qty 1

## 2017-05-11 NOTE — Progress Notes (Signed)
Subjective: 2 Days Post-Op Procedure(s) (LRB): Left knee medial collateral ligament repair, anterior cruciate ligament and posterior cruciate ligament reconstruction, meniscal debridement (Left) KNEE ARTHROSCOPY WITH POSTERIOR CRUCIATE LIGAMENT (PCL) RECONSTRUCTION (Right) Patient reports pain as moderate.  No complaints .   Objective: Vital signs in last 24 hours: Temp:  [98.1 F (36.7 C)-98.6 F (37 C)] 98.1 F (36.7 C) (12/23 0420) Pulse Rate:  [79-90] 86 (12/23 0420) BP: (118-161)/(78-89) 161/80 (12/23 0420) SpO2:  [97 %-100 %] 100 % (12/23 0420)  Intake/Output from previous day: 12/22 0701 - 12/23 0700 In: -  Out: 1200 [Urine:1200] Intake/Output this shift: No intake/output data recorded.  Recent Labs    05/09/17 1907 05/10/17 0732  HGB 11.6* 10.8*   Recent Labs    05/09/17 1907 05/10/17 0732  WBC 5.5 8.8  RBC 3.79* 3.52*  HCT 34.4* 32.4*  PLT 193 203   Recent Labs    05/09/17 1907  CREATININE 0.95   Recent Labs    05/10/17 0732 05/11/17 0616  INR 1.18 2.05   Bilateral lower legs: Dorsiflexion/Plantar flexion intact Incision: dressing C/D/I Compartment soft  Assessment/Plan: 2 Days Post-Op Procedure(s) (LRB): Left knee medial collateral ligament repair, anterior cruciate ligament and posterior cruciate ligament reconstruction, meniscal debridement (Left) KNEE ARTHROSCOPY WITH POSTERIOR CRUCIATE LIGAMENT (PCL) RECONSTRUCTION (Right) INR 2.05 pharmacy dosing  Agree with CIR patient has no one at home will need more PT/OT to be safe at home  Up with PT Richardean CanalGILBERT CLARK 05/11/2017, 7:48 AM
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
## 2017-05-11 NOTE — Progress Notes (Signed)
ANTICOAGULATION CONSULT NOTE - Follow Up Consult  Pharmacy Consult for Coumadin Indication: hx AVR and VTE prophylaxis s/p knee surgery  Allergies  Allergen Reactions  . Scallops [Shellfish Allergy] Nausea And Vomiting    Patient Measurements: Height: 5' 8.5" (174 cm) Weight: 170 lb 13.7 oz (77.5 kg) IBW/kg (Calculated) : 69.55 Vital Signs: Temp: 98.1 F (36.7 C) (12/23 0420) Temp Source: Oral (12/23 0420) BP: 161/80 (12/23 0420) Pulse Rate: 86 (12/23 0420)  Labs: Recent Labs    05/09/17 0634 05/09/17 1907 05/10/17 0732 05/11/17 0616  HGB  --  11.6* 10.8*  --   HCT  --  34.4* 32.4*  --   PLT  --  193 203  --   LABPROT 15.3*  --  14.9 23.0*  INR 1.22  --  1.18 2.05  CREATININE  --  0.95  --   --     Estimated Creatinine Clearance: 76.3 mL/min (by C-G formula based on SCr of 0.95 mg/dL).  Assessment: 66 year old male s/p bilateral knee surgery who is on chronic Coumadin prior to admission for history of AVR had Coumadin resumed 12/21 PM.   Prior to admission he was on 7.5mg  daily except for 11.25 mg on Tuesdays and Thursdays however his INR has been fluctuating recently due to interaction with suppressive antibiotics with heart valve and history of pelvic osteomyelitis (Doxycycline and Moxifloxacin). Last dose PTA was 12/16. Last Lovenox dose PTA was 12/16 (was on Lovenox 75 mg SQ every 12 hours as bridge).   INR today is 2.05, sub-therapeutic for goal (large jump from 1.18 after 2 doses of 12.5mg , likely to continue to rise as true effect will be seen in AM).  Remains on Doxycycline and Moxifloxacin for suppression which are likely increasing INR.  Hgb 10.8. Platelets are stable.  SCr is stable.  No bleeding reported.   Goal of Therapy:  INR 2.5 to 3.5 Monitor platelets by anticoagulation protocol: Yes   Plan:  Warfarin 2mg  po x1 tonight (very low dose due to sharp rise in INR) Daily PT/INR Monitor INR increases closely with interacting antibiotics.   Link SnufferJessica  Ehren Berisha, PharmD, BCPS, BCCCP Clinical Pharmacist Clinical phone 05/11/2017 until 3:30PM [phone] After hours, please call #[phone] 05/11/2017,8:31 AM

## 2017-05-12 DIAGNOSIS — G8918 Other acute postprocedural pain: Secondary | ICD-10-CM

## 2017-05-12 DIAGNOSIS — Z8673 Personal history of transient ischemic attack (TIA), and cerebral infarction without residual deficits: Secondary | ICD-10-CM

## 2017-05-12 DIAGNOSIS — E871 Hypo-osmolality and hyponatremia: Secondary | ICD-10-CM

## 2017-05-12 DIAGNOSIS — S83512A Sprain of anterior cruciate ligament of left knee, initial encounter: Secondary | ICD-10-CM

## 2017-05-12 DIAGNOSIS — Z952 Presence of prosthetic heart valve: Secondary | ICD-10-CM

## 2017-05-12 LAB — PROTIME-INR
INR: 3.16
PROTHROMBIN TIME: 32.2 s — AB (ref 11.4–15.2)

## 2017-05-12 MED ORDER — ENOXAPARIN SODIUM 30 MG/0.3ML ~~LOC~~ SOLN: 30.0000 mg | Freq: Two times a day (BID) | SUBCUTANEOUS | 0 refills | Status: DC

## 2017-05-12 MED ORDER — WARFARIN SODIUM 5 MG PO TABS
5.0000 mg | ORAL_TABLET | Freq: Once | ORAL | Status: AC
  Administered 2017-05-12: 5 mg via ORAL
  Filled 2017-05-12: qty 1

## 2017-05-12 MED ORDER — OXYCODONE HCL 10 MG PO TABS: 10.0000 mg | ORAL_TABLET | ORAL | 0 refills | Status: DC | PRN

## 2017-05-12 NOTE — Discharge Summary (Signed)
Physician Discharge Summary      Patient ID: Caleb ArthursJohn J Dawson MRN: [id number] DOB/AGE: October 20, 1950 66 y.o.  Admit date: 05/09/2017 Discharge date: 05/12/2017  Admission Diagnoses:  <principal problem not specified>  Discharge Diagnoses:  Active Problems:   Tear of PCL (posterior cruciate ligament) of knee, left, initial encounter   Past Medical History:  Diagnosis Date  . Anxiety    ?  panic attack   . Arthritis   . Bicuspid aortic valve    s/p Bentall procedure 2013  . Cardiac tamponade   . Chronic anticoagulation 03/13/2017  . Headache    PMH: Migraines  . Hypothyroid   . Osteomyelitis (HCC), pelvis  03/11/2017  . Peritonitis (HCC)   . Pneumonia    2 yrs ago  . Poor appetite 03/05/2017  . Stroke [facility](HCC)    2013    Surgeries: Procedure(s): Left knee medial collateral ligament repair, anterior cruciate ligament and posterior cruciate ligament reconstruction, meniscal debridement KNEE ARTHROSCOPY WITH POSTERIOR CRUCIATE LIGAMENT (PCL) RECONSTRUCTION on 05/09/2017   Consultants (if any):   Discharged Condition: Improved  Hospital Course: Caleb ArthursJohn J Dawson is an 66 y.o. male who was admitted 05/09/2017 with a diagnosis of <principal problem not specified> and went to the operating room on 05/09/2017 and underwent the above named procedures.    He was given perioperative antibiotics:  Anti-infectives (From admission, onward)   Start     Dose/Rate Route Frequency Ordered Stop   05/09/17 2200  doxycycline (VIBRA-TABS) tablet 100 mg     100 mg Oral 2 times daily 05/09/17 1854     05/09/17 2000  moxifloxacin (AVELOX) tablet 400 mg     400 mg Oral Every 24 hours 05/09/17 1905     05/09/17 1900  levofloxacin (LEVAQUIN) tablet 750 mg  Status:  Discontinued     750 mg Oral Daily 05/09/17 1854 05/09/17 1904   05/09/17 1900  valACYclovir (VALTREX) tablet 1,000 mg     1,000 mg Oral Daily 05/09/17 1854     05/09/17 0621  ceFAZolin (ANCEF) IVPB 2g/100 mL premix     2 g 200 mL/hr  over 30 Minutes Intravenous On call to O.R. 05/09/17 [id number] 05/09/17 1621    .  He was given sequential compression devices, early ambulation, and lovenox and coumadin for DVT prophylaxis.  He benefited maximally from the hospital stay and there were no complications.    Recent vital signs:  Vitals:   05/11/17 2034 05/12/17 0630  BP: 108/71 127/74  Pulse: 81 84  Resp: 17 16  Temp: (!) 97.5 F (36.4 C) 98.3 F (36.8 C)  SpO2: 100% 100%    Recent laboratory studies:  Lab Results  Component Value Date   HGB 10.8 (L) 05/10/2017   HGB 11.6 (L) 05/09/2017   HGB 12.5 (L) 05/07/2017   Lab Results  Component Value Date   WBC 8.8 05/10/2017   PLT 203 05/10/2017   Lab Results  Component Value Date   INR 3.16 05/12/2017   Lab Results  Component Value Date   NA 130 (L) 05/07/2017   K 4.2 05/07/2017   CL 99 (L) 05/07/2017   CO2 22 05/07/2017   BUN 14 05/07/2017   CREATININE 0.95 05/09/2017   GLUCOSE 99 05/07/2017    Discharge Medications:   Allergies as of 05/12/2017      Reactions   Scallops [shellfish Allergy] Nausea And Vomiting      Medication List    TAKE these medications   acetaminophen 500 MG tablet  Commonly known as:  TYLENOL Take 1,000 mg by mouth every 6 (six) hours as needed for moderate pain or headache.   doxycycline 100 MG tablet Commonly known as:  VIBRA-TABS Take 1 tablet (100 mg total) by mouth 2 (two) times daily.   enoxaparin 80 MG/0.8ML injection Commonly known as:  LOVENOX Inject 0.75 mLs (75 mg total) into the skin every 12 (twelve) hours. What changed:  Another medication with the same name was added. Make sure you understand how and when to take each.   enoxaparin 30 MG/0.3ML injection Commonly known as:  LOVENOX Inject 0.3 mLs (30 mg total) into the skin every 12 (twelve) hours. What changed:  You were already taking a medication with the same name, and this prescription was added. Make sure you understand how and when to take each.     levothyroxine 50 MCG tablet Commonly known as:  SYNTHROID, LEVOTHROID Take 50 mcg by mouth daily.   moxifloxacin 400 MG tablet Commonly known as:  AVELOX Take 1 tablet (400 mg total) by mouth daily at 8 pm.   multivitamin with minerals Tabs tablet Take 1 tablet by mouth daily.   Oxycodone HCl 10 MG Tabs Take 1 tablet (10 mg total) by mouth every 3 (three) hours as needed for severe pain ((score 7 to 10)).   PROBIOTIC PO Take 1 capsule by mouth 2 (two) times daily.   sertraline 50 MG tablet Commonly known as:  ZOLOFT Take 50 mg by mouth daily.   traMADol 50 MG tablet Commonly known as:  ULTRAM Take 1-2 tablets (50-100 mg total) by mouth every 6 (six) hours as needed. What changed:  reasons to take this   valACYclovir 1000 MG tablet Commonly known as:  VALTREX Take 1,000 mg by mouth daily.   warfarin 2.5 MG tablet Commonly known as:  COUMADIN Take 3-4.5 tablets (7.5-11.25 mg total) by mouth See admin instructions. Take 7.5 mg by mouth daily on Monday, Wednesday, Friday, Saturday and Sunday. Take 11.25 mg by mouth daily on Tuesday and Thursday.       Diagnostic Studies: Dg Knee 1-2 Views Left  Result Date: 05/09/2017 CLINICAL DATA:  ACL and PCL ligament reconstruction. EXAM: LEFT KNEE - 1-2 VIEW; DG C-ARM 61-120 MIN COMPARISON:  None. FINDINGS: Fluoroscopy showing knee ligament repair with anchors. No acute finding. IMPRESSION: Fluoroscopy for ligamentous reconstruction. Electronically Signed   By: Marnee SpringJonathon  Watts M.D.   On: 05/09/2017 15:36   Dg Knee Left Port  Result Date: 05/09/2017 CLINICAL DATA:  Status post knee reconstruction. Incorrect needle count. EXAM: PORTABLE LEFT KNEE - 1-2 VIEW COMPARISON:  None. FINDINGS: The patient is status post ACL and PCL repair. No unexpected needle is seen in the visible soft tissues of the radiograph. Small Teflon pledgets anchor the threads which have been placed. IMPRESSION: Negative for surgical needle in the visualized field of  the AP radiograph. This report was called to the operating room at 4:42 p.m. by myself. Electronically Signed   By: Elsie StainJohn T Curnes M.D.   On: 05/09/2017 16:44   Dg C-arm 1-60 Min  Result Date: 05/09/2017 CLINICAL DATA:  ACL and PCL ligament reconstruction. EXAM: LEFT KNEE - 1-2 VIEW; DG C-ARM 61-120 MIN COMPARISON:  None. FINDINGS: Fluoroscopy showing knee ligament repair with anchors. No acute finding. IMPRESSION: Fluoroscopy for ligamentous reconstruction. Electronically Signed   By: Marnee SpringJonathon  Watts M.D.   On: 05/09/2017 15:36    Disposition: 01-Home or Self Care  Discharge Instructions    Call MD / Call  [phone]   Complete by:  As directed    If you experience chest pain or shortness of breath, CALL 911 and be transported to the hospital emergency room.  If you develope a fever above 101.5 F, pus (white drainage) or increased drainage or redness at the wound, or calf pain, call your surgeon's office.   Constipation Prevention   Complete by:  As directed    Drink plenty of fluids.  Prune juice may be helpful.  You may use a stool softener, such as Colace (over the counter) 100 mg twice a day.  Use MiraLax (over the counter) for constipation as needed.   Driving restrictions   Complete by:  As directed    No driving while taking narcotic pain meds.   Increase activity slowly as tolerated   Complete by:  As directed          Signed: Glee Arvin 05/12/2017, 7:59 AM

## 2017-05-12 NOTE — Progress Notes (Signed)
   Subjective:  Patient reports pain as mild.    Objective:   VITALS:   Vitals:   05/11/17 0420 05/11/17 1356 05/11/17 2034 05/12/17 0630  BP: (!) 161/80 131/84 108/71 127/74  Pulse: 86 94 81 84  Resp:   17 16  Temp: 98.1 F (36.7 C) 98.7 F (37.1 C) (!) 97.5 F (36.4 C) 98.3 F (36.8 C)  TempSrc: Oral Oral Oral Oral  SpO2: 100% 99% 100% 100%  Weight:      Height:        Neurologically intact Neurovascular intact Sensation intact distally Intact pulses distally Dorsiflexion/Plantar flexion intact Incision: dressing C/D/I and no drainage No cellulitis present Compartment soft   Lab Results  Component Value Date   WBC 8.8 05/10/2017   HGB 10.8 (L) 05/10/2017   HCT 32.4 (L) 05/10/2017   MCV 92.0 05/10/2017   PLT 203 05/10/2017     Assessment/Plan:  3 Days Post-Op   - Expected postop acute blood loss anemia - will monitor for symptoms - Up with PT/OT - DVT ppx - SCDs, ambulation, lovenox 30 mg BID bridge, back on 2 mg coumadin - WBAT in KI to RLE - PWB LLE - Pain controlled - Discharge planning - dc to CIR when bed is available  Glee ArvinMichael Xu 05/12/2017, 8:00 AM [phone]

## 2017-05-12 NOTE — Progress Notes (Signed)
ANTICOAGULATION CONSULT NOTE - Follow Up Consult  Pharmacy Consult for Coumadin Indication: hx AVR and VTE prophylaxis s/p knee surgery  Allergies  Allergen Reactions  . Scallops [Shellfish Allergy] Nausea And Vomiting    Patient Measurements: Height: 5' 8.5" (174 cm) Weight: 170 lb 13.7 oz (77.5 kg) IBW/kg (Calculated) : 69.55 Vital Signs: Temp: 98.3 F (36.8 C) (12/24 0630) Temp Source: Oral (12/24 0630) BP: 127/74 (12/24 0630) Pulse Rate: 84 (12/24 0630)  Labs: Recent Labs    05/09/17 1907 05/10/17 0732 05/11/17 0616 05/12/17 0438  HGB 11.6* 10.8*  --   --   HCT 34.4* 32.4*  --   --   PLT 193 203  --   --   LABPROT  --  14.9 23.0* 32.2*  INR  --  1.18 2.05 3.16  CREATININE 0.95  --   --   --     Estimated Creatinine Clearance: 76.3 mL/min (by C-G formula based on SCr of 0.95 mg/dL).  Assessment: 66 year old male s/p bilateral knee surgery who is on chronic Coumadin prior to admission for history of mechanical AVR, had Coumadin resumed 12/21 PM.   Prior to admission he was on 7.5mg  daily except for 11.25 mg on Tuesdays and Thursdays however his INR has been fluctuating recently due to interaction with suppressive antibiotics with heart valve and history of pelvic osteomyelitis (Doxycycline and Moxifloxacin). Last dose PTA was 12/16. Last Lovenox dose PTA was 12/16 (was on Lovenox 75 mg SQ every 12 hours as bridge).   INR today is 3.16 and therapeutic for goal (rapid rise after 2 previous larger doses, very low dose yesterday).  Remains on Doxycycline and Moxifloxacin for suppression which are likely increasing INR.  Hgb 10.8. Platelets are stable.  SCr is stable.  No bleeding reported.   Goal of Therapy:  INR 2.5 to 3.5 Monitor platelets by anticoagulation protocol: Yes   Plan:  Warfarin 5 mg po tonight (lower dose due to sharp rise in INR) Daily PT/INR Monitor INR increases closely with interacting antibiotics Recommend continuing Lovenox another 24 hr to  ensure INR remains >2.5 with valve   Loura BackJennifer Brookside, PharmD, BCPS Clinical Pharmacist Phone for today [phone] Main pharmacy - [phone] 05/12/2017 10:49 AM
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
## 2017-05-12 NOTE — Progress Notes (Signed)
Inpatient Rehabilitation  Please refer to consult by Dr. Allena KatzPatel for full details; recommends an IP Rehab admission.  Given that this patient has BCBS and they are closed we cannot start authorization until 05/14/17.  If the patient remains in house will plan to follow up then.    Charlane FerrettiMelissa Milt Coye, M.A., CCC/SLP Admission Coordinator  [facility]  Cell [phone]
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
## 2017-05-13 LAB — PROTIME-INR
INR: 2.25
Prothrombin Time: 24.7 seconds — ABNORMAL HIGH (ref 11.4–15.2)

## 2017-05-13 MED ORDER — WARFARIN SODIUM 4 MG PO TABS
8.0000 mg | ORAL_TABLET | Freq: Once | ORAL | Status: AC
  Administered 2017-05-13: 8 mg via ORAL
  Filled 2017-05-13: qty 2

## 2017-05-13 MED ORDER — ASPIRIN 325 MG PO TABS
ORAL_TABLET | ORAL | Status: AC
  Filled 2017-05-13: qty 1

## 2017-05-13 NOTE — Progress Notes (Signed)
ANTICOAGULATION CONSULT NOTE - Follow Up Consult  Pharmacy Consult:  Coumadin Indication: hx AVR and VTE prophylaxis s/p knee surgery  Allergies  Allergen Reactions  . Scallops [Shellfish Allergy] Nausea And Vomiting    Patient Measurements: Height: 5' 8.5" (174 cm) Weight: 170 lb 13.7 oz (77.5 kg) IBW/kg (Calculated) : 69.55 Vital Signs: Temp: 98.2 F (36.8 C) (12/25 0640) Temp Source: Oral (12/25 0640) BP: 130/71 (12/25 0640) Pulse Rate: 76 (12/25 0640)  Labs: Recent Labs    05/11/17 0616 05/12/17 0438 05/13/17 0703  LABPROT 23.0* 32.2* 24.7*  INR 2.05 3.16 2.25    Estimated Creatinine Clearance: 76.3 mL/min (by C-G formula based on SCr of 0.95 mg/dL).   Assessment: [age] YOM s/p bilateral knee surgery to continue on Coumadin from PTA for history of mechanical AVR.  Patient's home Coumadin regimen is 7.5mg  daily except for 11.25 mg on Tuesdays and Thursdays; however, his INR has been fluctuating recently due to interaction with suppressive antibiotics (doxycycline and moxifloxacin).  Patient was on full-dose Lovenox bridge PTA and his last dose was on 05/04/17.  Today's INR is sub-therapeutic due to dose reduction the past 2 days.  He continues on prophylactic Lovenox per Ortho.  No bleeding reported.     Goal of Therapy:  INR 2.5 to 3.5 Monitor platelets by anticoagulation protocol: Yes    Plan:  Coumadin 8mg  PO today Lovenox 30mg  SQ Q12H per Ortho.  Consider increasing to full-dose bridge until INR > 2.5. Daily PT / INR   [name] D. Laney Potashang, PharmD, BCPS Pager:  [phone] 05/13/2017, 10:57 AM

## 2017-05-13 NOTE — Discharge Instructions (Signed)

## 2017-05-13 NOTE — Progress Notes (Signed)
     Subjective: 4 Days Post-Op Procedure(s) (LRB): Left knee medial collateral ligament repair, anterior cruciate ligament and posterior cruciate ligament reconstruction, meniscal debridement (Left) KNEE ARTHROSCOPY WITH POSTERIOR CRUCIATE LIGAMENT (PCL) RECONSTRUCTION (Right)  Awake, alert and oriented x 4. Bilateral knee surgeries with multiple ligament reconstructions. In good spirits.  Patient reports pain as mild.    Objective:   VITALS:  Temp:  [97.9 F (36.6 C)-99.7 F (37.6 C)] 98.2 F (36.8 C) (12/25 0640) Pulse Rate:  [76-91] 76 (12/25 0640) Resp:  [18] 18 (12/24 1341) BP: (122-130)/(71-81) 130/71 (12/25 0640) SpO2:  [98 %-100 %] 98 % (12/25 0640)  Neurologically intact ABD soft Neurovascular intact Sensation intact distally Intact pulses distally Dorsiflexion/Plantar flexion intact Incision: dressing C/D/I and scant drainage No cellulitis present Compartment soft NV normal. neg Hoffman sign, Knee immobilizer right knee FWB  left knee PWB. Tiny dry area of blood left knee inferior dressing.   LABS No results for input(s): HGB, WBC, PLT in the last 72 hours. No results for input(s): NA, K, CL, CO2, BUN, CREATININE, GLUCOSE in the last 72 hours. Recent Labs    05/12/17 0438 05/13/17 0703  INR 3.16 2.25     Assessment/Plan: 4 Days Post-Op Procedure(s) (LRB): Left knee medial collateral ligament repair, anterior cruciate ligament and posterior cruciate ligament reconstruction, meniscal debridement (Left) KNEE ARTHROSCOPY WITH POSTERIOR CRUCIATE LIGAMENT (PCL) RECONSTRUCTION (Right)  Advance diet Up with therapy D/C IV fluids  CIR has seen and he is a candidate for CIR when bed is available.  Caleb Dawson 05/13/2017, 9:19 AMPatient ID: Caleb Dawson, male   DOB: June 17, 1950, 66 y.o.   MRN: [id number]

## 2017-05-14 ENCOUNTER — Telehealth (INDEPENDENT_AMBULATORY_CARE_PROVIDER_SITE_OTHER): Payer: Self-pay | Admitting: Orthopedic Surgery

## 2017-05-14 ENCOUNTER — Encounter (HOSPITAL_COMMUNITY): Payer: Self-pay | Admitting: Orthopedic Surgery

## 2017-05-14 ENCOUNTER — Other Ambulatory Visit (INDEPENDENT_AMBULATORY_CARE_PROVIDER_SITE_OTHER): Payer: Self-pay

## 2017-05-14 LAB — PROTIME-INR
INR: 2.09
PROTHROMBIN TIME: 23.3 s — AB (ref 11.4–15.2)

## 2017-05-14 MED ORDER — ENOXAPARIN SODIUM 80 MG/0.8ML ~~LOC~~ SOLN: 75.0000 mg | Freq: Two times a day (BID) | SUBCUTANEOUS | 1 refills | Status: DC

## 2017-05-14 MED ORDER — WARFARIN SODIUM 5 MG PO TABS
10.0000 mg | ORAL_TABLET | Freq: Once | ORAL | Status: AC
  Administered 2017-05-14: 10 mg via ORAL
  Filled 2017-05-14: qty 2

## 2017-05-14 NOTE — Telephone Encounter (Signed)
[facility] on how the medication should be taken, he said they were unclear. Please Advise # [phone]

## 2017-05-14 NOTE — Social Work (Signed)
CSW following as a back up for SNF placement if CIR cannot extend a bed offer.  CSW received some SNF bed offers and will discuss with patient when appropriate.  Keene BreathPatricia Katiria Calame, LCSW Clinical Social Worker [phone]

## 2017-05-14 NOTE — Progress Notes (Signed)
Rehab admissions - I met with the patient this am.  I have tried to contact [facility] several times today.  I will submit the request for acute inpatient rehab admission once I can speak with a [facility] representative.  Patient would like inpatient rehab admission.  Call me for questions.  #[phone]

## 2017-05-14 NOTE — Progress Notes (Signed)
ANTICOAGULATION CONSULT NOTE - Follow Up Consult  Pharmacy Consult:  Coumadin Indication: hx AVR and VTE prophylaxis s/p knee surgery  Allergies  Allergen Reactions  . Scallops [Shellfish Allergy] Nausea And Vomiting    Patient Measurements: Height: 5' 8.5" (174 cm) Weight: 170 lb 13.7 oz (77.5 kg) IBW/kg (Calculated) : 69.55 Vital Signs: Temp: 97.9 F (36.6 C) (12/26 0429) Temp Source: Oral (12/26 0429) BP: 117/76 (12/26 0429) Pulse Rate: 76 (12/26 0429)  Labs: Recent Labs    05/12/17 0438 05/13/17 0703 05/14/17 1139  LABPROT 32.2* 24.7* 23.3*  INR 3.16 2.25 2.09    Estimated Creatinine Clearance: 76.3 mL/min (by C-G formula based on SCr of 0.95 mg/dL).   Assessment: [age] YOM s/p bilateral knee surgery to continue on Coumadin from PTA for history of mechanical AVR.  Patient's home Coumadin regimen is 7.5mg  daily except for 11.25 mg on Tuesdays and Thursdays; however, his INR has been fluctuating recently due to interaction with suppressive antibiotics (doxycycline and moxifloxacin).  Patient was on full-dose Lovenox bridge PTA and his last dose was on 05/04/17.  Today's INR is sub-therapeutic.Marland Kitchen.  He continues on prophylactic Lovenox per Ortho.  No bleeding reported.     Goal of Therapy:  INR 2.5 to 3.5 Monitor platelets by anticoagulation protocol: Yes    Plan:  Coumadin 10mg  PO today Lovenox 30mg  SQ Q12H per Ortho.  Consider increasing to full-dose bridge until INR > 2.5. Daily PT / INR    Kalista Laguardia D. Laney Potashang, PharmD, BCPS Pager:  [phone] 05/14/2017, 12:57 PM
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
## 2017-05-14 NOTE — Progress Notes (Signed)
Subjective: Pt stable - making some progress   Objective: Vital signs in last 24 hours: Temp:  [97.9 F (36.6 C)-98.5 F (36.9 C)] 97.9 F (36.6 C) (12/26 0429) Pulse Rate:  [72-81] 76 (12/26 0429) Resp:  [16-18] 16 (12/26 0429) BP: (96-122)/(61-76) 117/76 (12/26 0429) SpO2:  [98 %] 98 % (12/26 0429)  Intake/Output from previous day: 12/25 0701 - 12/26 0700 In: -  Out: 500 [Urine:500] Intake/Output this shift: No intake/output data recorded.  Exam:  Sensation intact distally Intact pulses distally  Labs: No results for input(s): HGB in the last 72 hours. No results for input(s): WBC, RBC, HCT, PLT in the last 72 hours. No results for input(s): NA, K, CL, CO2, BUN, CREATININE, GLUCOSE, CALCIUM in the last 72 hours. Recent Labs    05/12/17 0438 05/13/17 0703  INR 3.16 2.25    Assessment/Plan: Plan for dc to cir today   Mirant Scott Dean 05/14/2017, 8:11 AM

## 2017-05-14 NOTE — Telephone Encounter (Signed)
[facility] pharm, s/w them and s/w Marisue IvanLiz, I advised pharm on correct lovenox directions, Marisue IvanLiz to update meds.
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
## 2017-05-15 ENCOUNTER — Telehealth (INDEPENDENT_AMBULATORY_CARE_PROVIDER_SITE_OTHER): Payer: Self-pay

## 2017-05-15 LAB — PROTIME-INR
INR: 2.57
Prothrombin Time: 27.4 seconds — ABNORMAL HIGH (ref 11.4–15.2)

## 2017-05-15 MED ORDER — WARFARIN SODIUM 7.5 MG PO TABS
7.5000 mg | ORAL_TABLET | Freq: Once | ORAL | Status: AC
  Administered 2017-05-15: 7.5 mg via ORAL
  Filled 2017-05-15: qty 1

## 2017-05-15 NOTE — Progress Notes (Addendum)
ANTICOAGULATION CONSULT NOTE - Follow Up Consult  Pharmacy Consult:  Coumadin Indication: hx AVR and VTE prophylaxis s/p knee surgery  Allergies  Allergen Reactions  . Scallops [Shellfish Allergy] Nausea And Vomiting    Patient Measurements: Height: 5' 8.5" (174 cm) Weight: 170 lb 13.7 oz (77.5 kg) IBW/kg (Calculated) : 69.55 Vital Signs: Temp: 98.2 F (36.8 C) (12/27 0512) Temp Source: Oral (12/27 0512) BP: 133/80 (12/27 0512) Pulse Rate: 88 (12/27 0512)  Labs: Recent Labs    05/13/17 0703 05/14/17 1139 05/15/17 1006  LABPROT 24.7* 23.3* 27.4*  INR 2.25 2.09 2.57    Estimated Creatinine Clearance: 76.3 mL/min (by C-G formula based on SCr of 0.95 mg/dL).   Assessment: [age] YOM s/p bilateral knee surgery to continue on Coumadin from PTA for history of mechanical AVR.  Patient has been receiving prophylactic Lovenox here.   Today's INR is therapeutic; no bleeding reported.  Home Coumadin regimen is 7.5mg  daily except for 11.25 mg on Tuesdays and Thursdays; however, his INR has been fluctuating recently due to interaction with suppressive antibiotics (doxycycline and moxifloxacin).  Patient was on full-dose Lovenox bridge PTA and his last dose was on 05/04/17.  Goal of Therapy:  INR 2.5 to 3.5 Monitor platelets by anticoagulation protocol: Yes    Plan:  Coumadin 7.5mg  PO today Continue Lovenox 30mg  SQ Q12H to ensure INR remains therapeutic in AM Daily PT / INR   Demetrio Leighty D. Laney Potashang, PharmD, BCPS Pager:  [phone] 05/15/2017, 1:53 PM
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
## 2017-05-15 NOTE — Progress Notes (Signed)
Rehab admissions - I continue to await a determination regarding potential acute inpatient rehab admission.  I will notify all once I hear back from [facility].  Call me for questions.  #[phone]

## 2017-05-15 NOTE — Telephone Encounter (Signed)
N[facility] called and states they are still waiting for Authorization for inpatient rehab. Social worker spoke to patient and wanted him to go to SNF and he states he rather go home and do HHPT. Would like to see what Dr August Saucerean would like to do. Advised her he is out of the office and would see if another MD would advise on message.   CB [phone]
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
## 2017-05-15 NOTE — Progress Notes (Signed)
   05/15/17 1320  OT G-codes **NOT FOR INPATIENT CLASS**  Functional Assessment Tool Used AM-PAC 6 Clicks Daily Activity;Clinical judgement  Functional Limitation Self care  Self Care Current Status (Z6109(G8987) CK  Self Care Goal Status (U0454(G8988) CI   G-Codes for OT tx session.   Marcy SirenBreanna Akiva Brassfield, OT Pager [phone] 05/15/2017

## 2017-05-15 NOTE — Care Management Note (Signed)
Case Management Note  Patient Details  Name: Caleb ArthursJohn J Dawson MRN: [id number] Date of Birth: [birth year]  Subjective/Objective:                    Action/Plan:  Awaiting insurance authorization for inpt rehab. SW Elease HashimotoPatricia discussed SNF with patient. Per Elease HashimotoPatricia , patient would rather go home with home health and girl friend.  Called DR Dean's office spoke with Marisue IvanLiz. Awaiting call back from MD/PA to discuss home health.  Expected Discharge Date:  05/12/17               Expected Discharge Plan:  IP Rehab Facility  In-House Referral:     Discharge planning Services  CM Consult  Post Acute Care Choice:    Choice offered to:     DME Arranged:    DME Agency:     HH Arranged:    HH Agency:     Status of Service:  In process, will continue to follow  If discussed at Long Length of Stay Meetings, dates discussed:    Additional Comments:  Kingsley PlanWile, Caleb Stradley Marie, RN 05/15/2017, 4:48 PM

## 2017-05-16 ENCOUNTER — Telehealth (INDEPENDENT_AMBULATORY_CARE_PROVIDER_SITE_OTHER): Payer: Self-pay | Admitting: Radiology

## 2017-05-16 LAB — PROTIME-INR
INR: 2.92
PROTHROMBIN TIME: 30.2 s — AB (ref 11.4–15.2)

## 2017-05-16 LAB — CREATININE, SERUM: CREATININE: 0.82 mg/dL (ref 0.61–1.24)

## 2017-05-16 MED ORDER — WARFARIN SODIUM 5 MG PO TABS
5.0000 mg | ORAL_TABLET | Freq: Once | ORAL | Status: AC
  Administered 2017-05-16: 5 mg via ORAL
  Filled 2017-05-16: qty 1

## 2017-05-16 NOTE — Telephone Encounter (Signed)
Herbert SetaHeather, RN [facility] called to let Dr. August Saucerean know that she had patient status changed to inpatient this morning on the advice of her supervisor since he was not being discharged. If Dr. August Saucerean agrees with this, he will need to sign the order.  Herbert SetaHeather also states that [employer] gave approval for patient to be transferred to inpatient rehab, however now, the rehab doctor states that the patient is doing too well and walking too far and they will not accept him. His recommendation is that the patient go home with home health services. The social worker is now working to get the patient to a SNF. The patient states that he cannot go home with home health services. Heather wanted for Dr. August Saucerean to be aware what is taking place.   CB for Herbert SetaHeather is [phone]

## 2017-05-16 NOTE — Progress Notes (Signed)
Orthopedic Tech Progress Note Patient Details:  Caleb ArthursJohn J Dawson [birth year] [id number]  CPM Left Knee CPM Left Knee: On Left Knee Flexion (Degrees): 45 Left Knee Extension (Degrees): 0 Additional Comments: pt tolerated application very well agreed to increase to 45 degrees CPM Right Knee CPM Right Knee: On Right Knee Flexion (Degrees): 45 Right Knee Extension (Degrees): 0 Additional Comments: pt tolerated application very well agreed to increase to 45 degrees  Post Interventions Patient Tolerated: Well Instructions Provided: Care of device  [name], [name] 05/16/2017, 1:08 PM

## 2017-05-16 NOTE — Care Management Note (Signed)
Case Management Note  Patient Details  Name: Caleb ArthursJohn J Dawson MRN: [id number] Date of Birth: May 22, 1950  Subjective/Objective:                    Action/Plan: See note from inpt rehab. Discussed home health with patient . Patient states he cannot go home "there is no there". Patient now in agreement for SNF , [facility] and does not want to return there. Caleb Hashimotoatricia with SW aware and will follow up with patient.   Betsy at Dr August Saucerean 's office aware of all of above.  Expected Discharge Date:  05/17/17               Expected Discharge Plan:  Skilled Nursing Facility  In-House Referral:  Clinical Social Work  Discharge planning Services  CM Consult  Post Acute Care Choice:    Choice offered to:  Patient  DME Arranged:    DME Agency:     HH Arranged:    HH Agency:     Status of Service:  In process, will continue to follow  If discussed at Long Length of Stay Meetings, dates discussed:    Additional Comments:  Kingsley PlanWile, Caleb Dawson Marie, RN 05/16/2017, 12:53 PM

## 2017-05-16 NOTE — Progress Notes (Signed)
Rehab admissions - Patient is progressing well, is walking around in his room and required only supervision to ambulate 80 feet with PT.  I had my rehab MD review the record and speak with the patient.  [facility]hab [facility]  I have let the case manager and the social worker know of decision not to admit due to current progress.  Call me for questions.  #[phone]

## 2017-05-16 NOTE — Progress Notes (Signed)
ANTICOAGULATION CONSULT NOTE - Follow Up Consult  Pharmacy Consult:  Coumadin Indication: hx AVR and VTE prophylaxis s/p knee surgery  Allergies  Allergen Reactions  . Scallops [Shellfish Allergy] Nausea And Vomiting    Patient Measurements: Height: 5' 8.5" (174 cm) Weight: 170 lb 13.7 oz (77.5 kg) IBW/kg (Calculated) : 69.55 Vital Signs: Temp: 97.7 F (36.5 C) (12/28 0441) Temp Source: Oral (12/28 0441) BP: 124/71 (12/28 0441) Pulse Rate: 66 (12/28 0441)  Assessment: 66 YOM s/p bilateral knee surgery to continue on Coumadin from PTA for history of mechanical AVR. Patient has been receiving prophylactic Lovenox here. Today's INR went up further to 2.97 and is therapeutic; no bleeding reported.  Home Coumadin regimen is 7.5mg  daily except for 11.25 mg on Tuesdays and Thursdays; however, his INR has been fluctuating recently due to interaction with suppressive antibiotics (doxycycline and moxifloxacin).  Patient was on full-dose Lovenox bridge PTA and his last dose was on 05/04/17.  Goal of Therapy:  INR 2.5 to 3.5 Monitor platelets by anticoagulation protocol: Yes    Plan:  Give Coumadin 5mg  PO x 1 Stop Lovenox Monitor daily INR, CBC, s/s of bleed  Enzo BiNathan Tobie Hellen, PharmD, [facility] Clinical Pharmacist Pager [phone] 05/16/2017 11:24 AM

## 2017-05-16 NOTE — Telephone Encounter (Signed)
S/w Dr August Saucerean. He is going to go by and discharge patient.

## 2017-05-16 NOTE — Telephone Encounter (Signed)
I have forwarded information to Dr August Saucerean and am waiting on response.
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
## 2017-05-16 NOTE — Progress Notes (Addendum)
Patient examined -he is making slow and steady improvement.  Pain is controlled.  On examination he can do straight leg raises with both legs.  Range of motion improving to about 90 degrees on both legs  Incisions dressed and intact  He has not yet done stairs with physical therapy  [facility] is awaiting approval from his insurance company for inpatient rehab stay He has been approved for inpatient rehab stay which I do think would be beneficial for him for approximately 1 week  Until that time that it is approved we will continue to work with him here with physical therapy for gait training and stair training  We can revisit where he is from a physical therapy standpoint next week in terms of transfer/discharge to home versus discharge to inpatient rehab which would be for him the most ideal situation  INR is 2.92.  Plan to discontinue Lovenox

## 2017-05-16 NOTE — Social Work (Signed)
CSW met with patient at bedside and provided him list of SNF offers.  Pt will review and CSW will f/u.  CSW will f/u as SNF will need to obtain Insurance Auth for [facility] placement.  Elissa Hefty, LCSW Clinical Social Worker [phone]

## 2017-05-17 ENCOUNTER — Other Ambulatory Visit: Payer: Self-pay

## 2017-05-17 LAB — PROTIME-INR
INR: 2.79
Prothrombin Time: 29.2 seconds — ABNORMAL HIGH (ref 11.4–15.2)

## 2017-05-17 MED ORDER — WARFARIN SODIUM 7.5 MG PO TABS
7.5000 mg | ORAL_TABLET | Freq: Once | ORAL | Status: AC
  Administered 2017-05-17: 7.5 mg via ORAL
  Filled 2017-05-17: qty 1
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
## 2017-05-17 NOTE — Progress Notes (Signed)
ANTICOAGULATION CONSULT NOTE - Follow Up Consult  Pharmacy Consult:  Coumadin Indication: hx AVR and VTE prophylaxis s/p knee surgery  Allergies  Allergen Reactions  . Scallops [Shellfish Allergy] Nausea And Vomiting    Patient Measurements: Height: 5' 8.5" (174 cm) Weight: 170 lb 13.7 oz (77.5 kg) IBW/kg (Calculated) : 69.55 Vital Signs: Temp: 97.6 F (36.4 C) (12/29 0512) Temp Source: Oral (12/29 0512) BP: 102/62 (12/29 0512) Pulse Rate: 72 (12/29 0512)  Assessment: 66 YOM s/p bilateral knee surgery to continue on Coumadin from PTA for history of mechanical AVR.  Lovenox bridge discontinued 05/16/17.  INR remains therapeutic; no bleeding reported.   Goal of Therapy:  INR 2.5 to 3.5 Monitor platelets by anticoagulation protocol: Yes    Plan:  Coumadin 7.5mg  PO today Daily PT / INR   Caleb Dawson D. Laney Potashang, PharmD, BCPS Pager:  [phone] 05/17/2017, 9:00 AM

## 2017-05-18 LAB — PROTIME-INR
INR: 2.74
PROTHROMBIN TIME: 28.8 s — AB (ref 11.4–15.2)

## 2017-05-18 MED ORDER — WARFARIN SODIUM 7.5 MG PO TABS
7.5000 mg | ORAL_TABLET | Freq: Once | ORAL | Status: AC
  Administered 2017-05-18: 7.5 mg via ORAL
  Filled 2017-05-18: qty 1

## 2017-05-18 NOTE — Clinical Social Work Note (Addendum)
Facilities will be unable to receive BCBS auth over the weekend. CSW will initiate search for facilities that will take the pt under a LOG.   [facility] will not take LOG. [facility] will not take LOG. [facility] will not take LOG. [facility] has no beds available. Awaiting ot hear back form other facilities.  2:07[facility] All the facilites whom have made a offer will not take a LOG. CSW will Engineer, drillingcontact Assistant Director and expand search.    [place]blito del CarmenBridget Ryiah Bellissimo, [place]WA [phone]
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
## 2017-05-18 NOTE — Progress Notes (Signed)
ANTICOAGULATION CONSULT NOTE - Follow Up Consult  Pharmacy Consult:  Coumadin Indication: hx AVR and VTE prophylaxis s/p knee surgery  Allergies  Allergen Reactions  . Scallops [Shellfish Allergy] Nausea And Vomiting    Patient Measurements: Height: 5' 8.5" (174 cm) Weight: 170 lb 13.7 oz (77.5 kg) IBW/kg (Calculated) : 69.55 Vital Signs: Temp: 97.7 F (36.5 C) (12/30 0354) Temp Source: Oral (12/30 0354) BP: 107/64 (12/30 0354) Pulse Rate: 66 (12/30 0354)  Assessment: 66 YOM s/p bilateral knee surgery to continue on Coumadin from PTA for history of mechanical AVR.  Lovenox bridge discontinued 05/16/17.  INR remains therapeutic; no bleeding reported.   Goal of Therapy:  INR 2.5 to 3.5 Monitor platelets by anticoagulation protocol: Yes    Plan:  Repeat Coumadin 7.5mg  PO today Daily PT / INR for now   Tallis Soledad D. Laney Potashang, PharmD, BCPS Pager:  [phone] 05/18/2017, 9:18 AM

## 2017-05-19 LAB — PROTIME-INR
INR: 3.26
PROTHROMBIN TIME: 33 s — AB (ref 11.4–15.2)

## 2017-05-19 MED ORDER — WARFARIN SODIUM 7.5 MG PO TABS: 7.5000 mg | ORAL_TABLET | ORAL | Status: DC

## 2017-05-19 MED ORDER — WARFARIN SODIUM 5 MG PO TABS: 10.0000 mg | ORAL_TABLET | ORAL | Status: DC
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
## 2017-05-19 NOTE — Progress Notes (Signed)
Pt stable - moving well Ready for dc from mobility standpoint - dressings changed - incisions ok Dc today and f/u next week
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
## 2017-05-19 NOTE — Progress Notes (Signed)
ANTICOAGULATION CONSULT NOTE - Follow Up Consult  Pharmacy Consult:  Coumadin Indication: hx AVR and VTE prophylaxis s/p knee surgery  Allergies  Allergen Reactions  . Scallops [Shellfish Allergy] Nausea And Vomiting    Patient Measurements: Height: 5' 8.5" (174 cm) Weight: 170 lb 13.7 oz (77.5 kg) IBW/kg (Calculated) : 69.55 Vital Signs: Temp: 98 F (36.7 C) (12/31 0417) Temp Source: Oral (12/31 0417) BP: 124/72 (12/31 0417) Pulse Rate: 72 (12/31 0417)  Assessment: Anticoag: Coumadin for mech AVR (2013), s/p B/L knee surgery 12/21 - INR 2.74>3.26 today (goal 2.5-3.5),  *home dose: 7.5 mg MWFSS, 11.25 mg Tues/Thurs    Goal of Therapy:  INR 2.5 to 3.5 Monitor platelets by anticoagulation protocol: Yes    Plan:  Coumadin Change to 7.5mg  daily except 10mg  on Tues/Thurs Daily PT / INR for now   Dalaya Suppa S. Merilynn Finlandobertson, PharmD, [facility] Clinical Staff Pharmacist Pager [phone]  05/19/2017, 8:56 AM

## 2017-05-19 NOTE — Progress Notes (Signed)
CSW spoke with pt concerning SNF placement.  Based on PT/OT notes I am confident that [facility]  CSW confirmed with pt that he can get out of bed and ambulate independently.  Pt preference is to return home and he feels confident he can manage with help from his friend [name] and from neighbors who he can call if needed.  Pt is agreeable to home health services- has had [facility] in [facility] past- has shower chair and walker already- does not think he will need additional equipment.  CSW updated RNCM- CSW signing off  Burna SisJenna H. Theo Reither, LCSW Clinical Social Worker [phone]
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
## 2017-05-22 ENCOUNTER — Ambulatory Visit (INDEPENDENT_AMBULATORY_CARE_PROVIDER_SITE_OTHER): Payer: BLUE CROSS/BLUE SHIELD | Admitting: Infectious Disease

## 2017-05-22 ENCOUNTER — Encounter: Payer: Self-pay | Admitting: Infectious Disease

## 2017-05-22 VITALS — BP 146/80 | HR 74 | Temp 97.6°F | Ht 68.0 in | Wt 172.0 lb

## 2017-05-22 DIAGNOSIS — Z952 Presence of prosthetic heart valve: Secondary | ICD-10-CM | POA: Diagnosis not present

## 2017-05-22 DIAGNOSIS — A499 Bacterial infection, unspecified: Secondary | ICD-10-CM | POA: Diagnosis not present

## 2017-05-22 DIAGNOSIS — T847XXD Infection and inflammatory reaction due to other internal orthopedic prosthetic devices, implants and grafts, subsequent encounter: Secondary | ICD-10-CM

## 2017-05-22 NOTE — Progress Notes (Signed)
Subjective:   Chief complaint: knee pain   Patient ID: Caleb Dawson, male    DOB: [birth year], 67 y.o.   MRN: [id number]  HPI  67 y.o. 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  I treated him with IV invanz and vancomycin to cover for ALL of these organisms.  He had done well for the most part. He did see Dr. Marcelino Scot before my last visit  who was concerned he said due to loosening of hardware attributed to the pt bearing weight too much on this side.  His ESR is actually down to 16 on most recent check with home labs.  He was having  surgery on both knees contemplated by Dr. Marlou Sa.  We had concern about persistence of polymicrobial infection involving the hardware. I DC his PICC and put him on levaquin to cover the morganella (S to FQ) and his anerobes. He has had trouble tolerating this with poor appetite, nausea and loose stools but has continued it.   I had discussion with Dr. Kandis Ban both felt best option in [place] was to have his hardware removed and then follow this with IV antibiotics in hopes of curing his polymicrobial infection.  He ended up undergoing surgery in October that showed EXTENSIVE biofilm over the hardware. Patient underwent removal of hardware with partial excision of bone from pelvis and placement of antibiotic beads. Cultures were again taken and this time revealed a coagulase negative staphylococcus.  He was placed back on IV invanz and IV vancomycin. He claims his pain in his hip is improved. He did have erythema and hematoma that emerged at the operative site and Dr. Marcelino Scot aspirated this and I believe sent for culture.  ESR has been still elevated in high 30s and quant CRP up though latter is trending down now.  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   When I last saw him I changed him to oral doxy and moxifloxacin.  He has since then had surgery with Dr. Marlou Sa who perfromed   1. Right knee arthroscopy with posterolateral corner and lateral     collateral ligament reconstruction using hamstring allograft. 2. Left knee ACL reconstruction using GraftLink allograft. 3. Left knee PCL reconstruction using GraftLink allograft. 4. Partial medial and lateral meniscectomy on the left knee. 5. Reconstruction of the lateral collateral ligament and     posterolateral corner using hamstring allograft.    He states that his surgical site is still a bit "bumpy" and darker color than normal. I think this is largely scar tissue (see picture)  He has no pelvic or abdominal pain. Main pain is postoperative knee pain bilaterally.    Past Medical History:  Diagnosis Date  . Anxiety    ?  panic attack   . Arthritis   . Bicuspid aortic valve    s/p Bentall procedure 2013  . Cardiac tamponade   . Chronic anticoagulation 03/13/2017  . Headache    PMH: Migraines  . Hypothyroid   . Osteomyelitis ([place]), pelvis  03/11/2017  . Peritonitis ([place])   . Pneumonia    2 yrs ago  . Poor appetite 03/05/2017  . Stroke [facility])    2013  Past Surgical History:  Procedure Laterality Date  . ANTERIOR CRUCIATE LIGAMENT REPAIR Left 05/09/2017   Procedure: Left knee medial collateral ligament repair, anterior cruciate ligament and posterior cruciate ligament reconstruction, meniscal debridement;  Surgeon: Meredith Pel, MD;  Location: [place];  Service: Orthopedics;  Laterality: Left;  . AORTIC VALVE REPLACEMENT  2013   Bentall procedure w/ mechanical Aortic valve, post-op afib, post-op CVA req feeding tube, post-op tamponade  . APPLICATION OF WOUND VAC Left 11/28/2016   Procedure: APPLICATION OF WOUND VAC;  Surgeon: Altamese Mark, MD;  Location: [place];  Service:  Orthopedics;  Laterality: Left;  . BONE EXCISION N/A 03/11/2017   Procedure: PARTIAL EXCISION OF PELVIS;  Surgeon: Altamese Oxoboxo River, MD;  Location: [place];  Service: Orthopedics;  Laterality: N/A;  . CARDIAC SURGERY     for cardiac tamponade  . CLOSED REDUCTION FINGER WITH PERCUTANEOUS PINNING Right 12/07/2016   Procedure: CLOSED REDUCTION FINGER WITH PERCUTANEOUS PINNING;  Surgeon: Leanora Cover, MD;  Location: [place];  Service: Orthopedics;  Laterality: Right;  . EXTERNAL FIXATION PELVIS N/A 11/28/2016   Procedure: EXTERNAL FIXATION PELVIS;  Surgeon: Altamese Padroni, MD;  Location: [place];  Service: Orthopedics;  Laterality: N/A;  . EXTERNAL FIXATION REMOVAL N/A 12/10/2016   Procedure: REMOVAL EXTERNAL FIXATION PELVIS;  Surgeon: Altamese Jay, MD;  Location: [place];  Service: Orthopedics;  Laterality: N/A;  . HARDWARE REMOVAL N/A 03/11/2017   Procedure: HARDWARE REMOVAL;  Surgeon: Altamese Webb, MD;  Location: [place];  Service: Orthopedics;  Laterality: N/A;  . I&D EXTREMITY Bilateral 11/28/2016   Procedure: IRRIGATION AND DEBRIDEMENT EXTREMITY LOWER ANTERIOR LEGs;  Surgeon: Altamese Hidalgo, MD;  Location: [place];  Service: Orthopedics;  Laterality: Bilateral;  . I&D EXTREMITY N/A 12/19/2016   Procedure: IRRIGATION AND DEBRIDEMENT PELVIS;  Surgeon: Altamese Beeville, MD;  Location: [place];  Service: Orthopedics;  Laterality: N/A;  . I&D EXTREMITY N/A 12/21/2016   Procedure: IRRIGATION AND DEBRIDEMENT PELVIS WITH VAC CHANGE;  Surgeon: Altamese [place]ham, MD;  Location: [place];  Service: Orthopedics;  Laterality: N/A;  . INCISION AND DRAINAGE OF WOUND N/A 12/24/2016   Procedure: IRRIGATION AND DEBRIDEMENT WOUND WITH LAYERED CLOSURE;  Surgeon: Altamese Rothschild, MD;  Location: [place];  Service: Orthopedics;  Laterality: N/A;  . IR HYBRID TRAUMA EMBOLIZATION  11/28/2016  . KNEE ARTHROSCOPY WITH POSTERIOR CRUCIATE LIGAMENT (PCL) RECONSTRUCTION Right 05/09/2017   Procedure: KNEE ARTHROSCOPY WITH POSTERIOR CRUCIATE LIGAMENT (PCL)  RECONSTRUCTION;  Surgeon: Meredith Pel, MD;  Location: [place];  Service: Orthopedics;  Laterality: Right;  . ORIF PELVIC FRACTURE N/A 12/10/2016   Procedure: OPEN REDUCTION INTERNAL FIXATION (ORIF) PELVIC FRACTURE;  Surgeon: Altamese Eddystone, MD;  Location: [place];  Service: Orthopedics;  Laterality: N/A;  . PELVIC ANGIOGRAPHY N/A 11/28/2016   Procedure: PELVIC ANGIOGRAPHY CPT 41287;  Surgeon: Corrie Mckusick, DO;  Location: [place];  Service: Anesthesiology;  Laterality: N/A;  . SACROILIAC JOINT FUSION Bilateral 11/28/2016   Procedure: SACROILIAC JOINT FUSION;  Surgeon: Altamese Kaltag, MD;  Location: [place];  Service: Orthopedics;  Laterality: Bilateral;  . TEE WITHOUT CARDIOVERSION N/A 12/27/2016   Procedure: TRANSESOPHAGEAL ECHOCARDIOGRAM (TEE);  Surgeon: Pixie Casino, MD;  Location[facility] ENDOSCOPY;  Service: Cardiovascular;  Laterality: N/A;  . ULTRASOUND GUIDANCE FOR VASCULAR ACCESS Left 11/28/2016   Procedure: ULTRASOUND GUIDANCE FOR VASCULAR ACCESS, left iliac artery CPT 86767;  Surgeon: Corrie Mckusick, DO;  Location: [place];  Service: Anesthesiology;  Laterality: Left;    Family History  Problem Relation Age of Onset  .  Hypertension Mother        born in [birth year]  . Anemia Mother   . Sudden Cardiac Death Father 9      Social History   Socioeconomic History  . Marital status: Single    Spouse name: Not on file  . Number of children: Not on file  . Years of education: Not on file  . Highest education level: Not on file  Social Needs  . Financial resource strain: Not on file  . Food insecurity - worry: Not on file  . Food insecurity - inability: Not on file  . Transportation needs - medical: Not on file  . Transportation needs - non-medical: Not on file  Occupational History  . Occupation: Programmer, applications, geologist  Tobacco Use  . Smoking status: Never Smoker  . Smokeless tobacco: Never Used  Substance and Sexual Activity  . Alcohol use: No  . Drug use: No  . Sexual activity:  Not on file  Other Topics Concern  . Not on file  Social History Narrative   Pt lives with his wife.    Allergies  Allergen Reactions  . Scallops [Shellfish Allergy] Nausea And Vomiting     Current Outpatient Medications:  .  acetaminophen (TYLENOL) 500 MG tablet, Take 1,000 mg by mouth every 6 (six) hours as needed for moderate pain or headache., Disp: , Rfl:  .  doxycycline (VIBRA-TABS) 100 MG tablet, Take 1 tablet (100 mg total) by mouth 2 (two) times daily., Disp: 60 tablet, Rfl: 1 .  levothyroxine (SYNTHROID, LEVOTHROID) 50 MCG tablet, Take 50 mcg by mouth daily., Disp: , Rfl:  .  moxifloxacin (AVELOX) 400 MG tablet, Take 1 tablet (400 mg total) by mouth daily at 8 pm., Disp: 30 tablet, Rfl: 1 .  Multiple Vitamin (MULTIVITAMIN WITH MINERALS) TABS tablet, Take 1 tablet by mouth daily. (Patient not taking: Reported on 02/25/2017), Disp: , Rfl:  .  oxyCODONE 10 MG TABS, Take 1 tablet (10 mg total) by mouth every 3 (three) hours as needed for severe pain ((score 7 to 10))., Disp: 30 tablet, Rfl: 0 .  Probiotic Product (PROBIOTIC PO), Take 1 capsule by mouth 2 (two) times daily., Disp: , Rfl:  .  sertraline (ZOLOFT) 50 MG tablet, Take 50 mg by mouth daily. , Disp: , Rfl:  .  traMADol (ULTRAM) 50 MG tablet, Take 1-2 tablets (50-100 mg total) by mouth every 6 (six) hours as needed. (Patient taking differently: Take 50-100 mg by mouth every 6 (six) hours as needed for moderate pain. ), Disp: 45 tablet, Rfl: 0 .  valACYclovir (VALTREX) 1000 MG tablet, Take 1,000 mg by mouth daily., Disp: , Rfl:  .  warfarin (COUMADIN) 2.5 MG tablet, Take 3-4.5 tablets (7.5-11.25 mg total) by mouth See admin instructions. Take 7.5 mg by mouth daily on Monday, Wednesday, Friday, Saturday and Sunday. Take 11.25 mg by mouth daily on Tuesday and Thursday., Disp: 135 tablet, Rfl: 0   Review of Systems  Constitutional: Negative for appetite change, chills and fever.  HENT: Negative for congestion and sore throat.     Eyes: Negative for photophobia.  Respiratory: Negative for cough, shortness of breath and wheezing.   Cardiovascular: Negative for chest pain, palpitations and leg swelling.  Gastrointestinal: Negative for abdominal pain, blood in stool, constipation, diarrhea, nausea and vomiting.  Genitourinary: Negative for dysuria, flank pain and hematuria.  Musculoskeletal: Positive for arthralgias. Negative for myalgias.  Skin: Positive for color change and wound. Negative for rash.  Neurological: Negative for dizziness,  weakness and headaches.  Hematological: Does not bruise/bleed easily.  Psychiatric/Behavioral: Negative for suicidal ideas.       Objective:   Physical Exam  Constitutional: He is oriented to person, place, and time. He appears well-developed and well-nourished. No distress.  HENT:  Head: Normocephalic and atraumatic.  Mouth/Throat: No oropharyngeal exudate.  Eyes: Conjunctivae and EOM are normal. No scleral icterus.  Neck: Normal range of motion. Neck supple.  Cardiovascular: Normal rate and regular rhythm.  Murmur heard. Metallic click of valve audible  Pulmonary/Chest: Effort normal. No respiratory distress. He has no wheezes.  Abdominal: Soft. He exhibits no distension. There is tenderness.  Musculoskeletal: He exhibits no edema or tenderness.  Neurological: He is alert and oriented to person, place, and time. He exhibits normal muscle tone. Coordination normal.  Skin: Skin is warm and dry. No rash noted. He is not diaphoretic. There is erythema. No pallor.  Psychiatric: He has a normal mood and affect. His behavior is normal. Judgment and thought content normal.  Nursing note and vitals reviewed.     04/09/17:     05/22/17:          Assessment & Plan:   Polymicrobial infection near hardware of fractured pelvis:  He has finished 6 weeks of IV abx and now po abx together for more than 2 months postop  I d/w Dr. Marcelino Scot and ALL hardware is out besides some  screws that are posterior and were not clearly involved in the anterior infected hardware and bone  I will recheck ESR and CRP and if they are reassuring and Dr. Marcelino Scot is happy with his progress I would be ok with stopping his antibiotics. I did not make fu appt for him yet as he has tentative plans to move back to [facility] end of January  I spent greater than 25 minutes with the patient including greater than 50% of time in face to face counsel of the patient re how we have treated his infection, labs we would check,  and in coordination of his care with Dr. Marcelino Scot.

## 2017-05-23 ENCOUNTER — Telehealth (INDEPENDENT_AMBULATORY_CARE_PROVIDER_SITE_OTHER): Payer: Self-pay | Admitting: Radiology

## 2017-05-23 LAB — SEDIMENTATION RATE: SED RATE: 33 mm/h — AB (ref 0–20)

## 2017-05-23 LAB — C-REACTIVE PROTEIN: CRP: 40.9 mg/L — ABNORMAL HIGH (ref <8.0)

## 2017-05-23 NOTE — Telephone Encounter (Signed)
Rosanne AshingJim is calling to let us know that the patient is NOT home bound and that he needs a referral for outpatient Physical Therapy.

## 2017-05-26 ENCOUNTER — Telehealth: Payer: Self-pay | Admitting: *Deleted

## 2017-05-26 DIAGNOSIS — A499 Bacterial infection, unspecified: Secondary | ICD-10-CM

## 2017-05-26 MED ORDER — DOXYCYCLINE HYCLATE 100 MG PO TABS: 100.0000 mg | ORAL_TABLET | Freq: Two times a day (BID) | ORAL | 1 refills | Status: DC

## 2017-05-26 MED ORDER — MOXIFLOXACIN HCL 400 MG PO TABS: 400.0000 mg | ORAL_TABLET | Freq: Every day | ORAL | 1 refills | Status: DC

## 2017-05-26 NOTE — Telephone Encounter (Signed)
-----  Message from Truman Hayward, MD sent at 05/23/2017 11:16 AM EST ----- ;patients ESR and CRP are both up possibly because he just had knee surgery but I would prefer he continue the antibiotics for now. He can come back and get repeat labs in the next 2-3 weeks and see me as well

## 2017-05-26 NOTE — Telephone Encounter (Signed)
Please advise. Thanks.  

## 2017-05-26 NOTE — Telephone Encounter (Signed)
Received verbal order from Dr. Daiva EvesVan Dam to continue the patient's Avelox and doxycycline antibiotic prescriptions for 30 days.  Message left for the patient needs to continue oral antibiotics for 30 days per Dr. Daiva EvesVan Dam and [facility]  Prescription sent to patient's local pharmacy for pick-up.

## 2017-05-26 NOTE — Progress Notes (Signed)
Sounds good. Hope they come WAY down!

## 2017-05-26 NOTE — Telephone Encounter (Signed)
Done pls clal thx

## 2017-05-26 NOTE — Telephone Encounter (Signed)
[facility] P.T. And LM for him advising that we had written order for out patient P.T. I also tried calling patient to discuss with him, no answer. LM for him to call me back to discuss if he had questions. I have faxed order to [place] P.T.

## 2017-05-29 ENCOUNTER — Encounter (INDEPENDENT_AMBULATORY_CARE_PROVIDER_SITE_OTHER): Payer: Self-pay | Admitting: Orthopedic Surgery

## 2017-05-29 ENCOUNTER — Ambulatory Visit (INDEPENDENT_AMBULATORY_CARE_PROVIDER_SITE_OTHER): Payer: BLUE CROSS/BLUE SHIELD | Admitting: Orthopedic Surgery

## 2017-05-29 DIAGNOSIS — S83512D Sprain of anterior cruciate ligament of left knee, subsequent encounter: Secondary | ICD-10-CM

## 2017-05-29 DIAGNOSIS — S83511D Sprain of anterior cruciate ligament of right knee, subsequent encounter: Secondary | ICD-10-CM

## 2017-05-30 ENCOUNTER — Encounter (INDEPENDENT_AMBULATORY_CARE_PROVIDER_SITE_OTHER): Payer: Self-pay | Admitting: Orthopedic Surgery
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
## 2017-06-02 ENCOUNTER — Telehealth (INDEPENDENT_AMBULATORY_CARE_PROVIDER_SITE_OTHER): Payer: Self-pay | Admitting: Orthopedic Surgery

## 2017-06-02 NOTE — Telephone Encounter (Signed)
faxed

## 2017-06-02 NOTE — Telephone Encounter (Signed)
Annabelle Har[facility]Dana from [facility] called requesting the op report to be faxed over to [phone]

## 2017-06-04 ENCOUNTER — Encounter: Payer: Self-pay | Admitting: Infectious Disease

## 2017-06-04 ENCOUNTER — Ambulatory Visit: Payer: BLUE CROSS/BLUE SHIELD | Admitting: Infectious Disease

## 2017-06-04 VITALS — BP 116/79 | HR 71 | Temp 98.6°F | Ht 68.0 in | Wt 168.0 lb

## 2017-06-04 DIAGNOSIS — T847XXD Infection and inflammatory reaction due to other internal orthopedic prosthetic devices, implants and grafts, subsequent encounter: Secondary | ICD-10-CM | POA: Diagnosis not present

## 2017-06-04 DIAGNOSIS — M86151 Other acute osteomyelitis, right femur: Secondary | ICD-10-CM

## 2017-06-04 DIAGNOSIS — Z952 Presence of prosthetic heart valve: Secondary | ICD-10-CM

## 2017-06-04 DIAGNOSIS — A499 Bacterial infection, unspecified: Secondary | ICD-10-CM

## 2017-06-04 DIAGNOSIS — G8918 Other acute postprocedural pain: Secondary | ICD-10-CM

## 2017-06-04 NOTE — Progress Notes (Signed)
Subjective:   Chief complaint: knee pain   Patient ID: Caleb Dawson, male    DOB: [birth year], 67 y.o.   MRN: [id number]  HPI  67 y.o. 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  I treated him with IV invanz and vancomycin to cover for ALL of these organisms.  He had done well for the most part. He did see Dr. Marcelino Scot before my last visit  who was concerned he said due to loosening of hardware attributed to the pt bearing weight too much on this side.  His ESR is actually down to 16 on most recent check with home labs.  He was having  surgery on both knees contemplated by Dr. Marlou Sa.  We had concern about persistence of polymicrobial infection involving the hardware. I DC his PICC and put him on levaquin to cover the morganella (S to FQ) and his anerobes. He has had trouble tolerating this with poor appetite, nausea and loose stools but has continued it.   I had discussion with Dr. Kandis Ban both felt best option in [name] was to have his hardware removed and then follow this with IV antibiotics in hopes of curing his polymicrobial infection.  He ended up undergoing surgery in October that showed EXTENSIVE biofilm over the hardware. Patient underwent removal of hardware with partial excision of bone from pelvis and placement of antibiotic beads. Cultures were again taken and this time revealed a coagulase negative staphylococcus.  He was placed back on IV invanz and IV vancomycin. He claims his pain in his hip is improved. He did have erythema and hematoma that emerged at the operative site and Dr. Marcelino Scot aspirated this and I believe sent for culture.  ESR has been still elevated in high 30s and quant CRP up though latter was trending down now.  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   When I last saw him I changed him to oral doxy and moxifloxacin.  He has since then had surgery with Dr. Marlou Sa who perfromed   1. Right knee arthroscopy with posterolateral corner and lateral     collateral ligament reconstruction using hamstring allograft. 2. Left knee ACL reconstruction using GraftLink allograft. 3. Left knee PCL reconstruction using GraftLink allograft. 4. Partial medial and lateral meniscectomy on the left knee. 5. Reconstruction of the lateral collateral ligament and     posterolateral corner using hamstring allograft.    He stated that his surgical site is still a bit "bumpy" and darker color than normal. I think this is largely scar tissue (see picture)  He hadno pelvic or abdominal pain. Main pain is postoperative knee pain bilaterally.  I last saw him I was wanted to take him off antibiotics but when I rechecked his sed rate and C-reactive protein they had gone up.  I suspect this may be due to the fact that he had some postoperative inflammation after his knee surgery and have brought him back today to clinic to reevaluate him.  He states again that he has no pain in his pelvic area at all and the wound continues to improve he does continue to have knee pain.  He is going to move back to  [place] in early February.  I have kept him on antibiotics in the interim and we will recheck labs today.    Past Medical History:  Diagnosis Date  . Anxiety    ?  panic attack   . Arthritis   . Bicuspid aortic valve    s/p Bentall procedure 2013  . Cardiac tamponade   . Chronic anticoagulation 03/13/2017  . Headache    PMH: Migraines  . Hypothyroid   . Osteomyelitis ([place]), pelvis  03/11/2017  . Peritonitis ([place])   . Pneumonia    2 yrs ago  . Poor appetite 03/05/2017  . Stroke [facility])    2013    Past Surgical History:  Procedure Laterality Date  . ANTERIOR CRUCIATE  LIGAMENT REPAIR Left 05/09/2017   Procedure: Left knee medial collateral ligament repair, anterior cruciate ligament and posterior cruciate ligament reconstruction, meniscal debridement;  Surgeon: Meredith Pel, MD;  Location: [place];  Service: Orthopedics;  Laterality: Left;  . AORTIC VALVE REPLACEMENT  2013   Bentall procedure w/ mechanical Aortic valve, post-op afib, post-op CVA req feeding tube, post-op tamponade  . APPLICATION OF WOUND VAC Left 11/28/2016   Procedure: APPLICATION OF WOUND VAC;  Surgeon: Altamese [place], MD;  Location: [place];  Service: Orthopedics;  Laterality: Left;  . BONE EXCISION N/A 03/11/2017   Procedure: PARTIAL EXCISION OF PELVIS;  Surgeon: Altamese Bainbridge, MD;  Location: [place];  Service: Orthopedics;  Laterality: N/A;  . CARDIAC SURGERY     for cardiac tamponade  . CLOSED REDUCTION FINGER WITH PERCUTANEOUS PINNING Right 12/07/2016   Procedure: CLOSED REDUCTION FINGER WITH PERCUTANEOUS PINNING;  Surgeon: Leanora Cover, MD;  Location: [place];  Service: Orthopedics;  Laterality: Right;  . EXTERNAL FIXATION PELVIS N/A 11/28/2016   Procedure: EXTERNAL FIXATION PELVIS;  Surgeon: Altamese Eustis, MD;  Location: [place];  Service: Orthopedics;  Laterality: N/A;  . EXTERNAL FIXATION REMOVAL N/A 12/10/2016   Procedure: REMOVAL EXTERNAL FIXATION PELVIS;  Surgeon: Altamese [place], MD;  Location: [place];  Service: Orthopedics;  Laterality: N/A;  . HARDWARE REMOVAL N/A 03/11/2017   Procedure: HARDWARE REMOVAL;  Surgeon: Altamese [place], MD;  Location: [place];  Service: Orthopedics;  Laterality: N/A;  . I&D EXTREMITY Bilateral 11/28/2016   Procedure: IRRIGATION AND DEBRIDEMENT EXTREMITY LOWER ANTERIOR LEGs;  Surgeon: Altamese [place], MD;  Location: [place];  Service: Orthopedics;  Laterality: Bilateral;  . I&D EXTREMITY N/A 12/19/2016   Procedure: IRRIGATION AND DEBRIDEMENT PELVIS;  Surgeon: Altamese [place]ley, MD;  Location: [place];  Service: Orthopedics;  Laterality: N/A;  . I&D EXTREMITY N/A  12/21/2016   Procedure: IRRIGATION AND DEBRIDEMENT PELVIS WITH VAC CHANGE;  Surgeon: Altamese [place], MD;  Location: [place];  Service: Orthopedics;  Laterality: N/A;  . INCISION AND DRAINAGE OF WOUND N/A 12/24/2016   Procedure: IRRIGATION AND DEBRIDEMENT WOUND WITH LAYERED CLOSURE;  Surgeon: Altamese Vazquez, MD;  Location: [place];  Service: Orthopedics;  Laterality: N/A;  . IR HYBRID TRAUMA EMBOLIZATION  11/28/2016  . KNEE ARTHROSCOPY WITH POSTERIOR CRUCIATE LIGAMENT (PCL) RECONSTRUCTION Right 05/09/2017   Procedure: KNEE ARTHROSCOPY WITH POSTERIOR CRUCIATE LIGAMENT (PCL) RECONSTRUCTION;  Surgeon: Meredith Pel, MD;  Location: [place];  Service: Orthopedics;  Laterality: Right;  . ORIF PELVIC FRACTURE N/A 12/10/2016   Procedure: OPEN REDUCTION INTERNAL FIXATION (ORIF) PELVIC FRACTURE;  Surgeon: Altamese , MD;  Location: [place];  Service: Orthopedics;  Laterality: N/A;  . PELVIC ANGIOGRAPHY N/A 11/28/2016   Procedure: PELVIC ANGIOGRAPHY CPT 16109;  Surgeon: Corrie Mckusick, DO;  Location:  [place] OR;  Service: Anesthesiology;  Laterality: N/A;  . SACROILIAC JOINT FUSION Bilateral 11/28/2016   Procedure: SACROILIAC JOINT FUSION;  Surgeon: Altamese [place], MD;  Location: [place];  Service: Orthopedics;  Laterality: Bilateral;  . TEE WITHOUT CARDIOVERSION N/A 12/27/2016   Procedure: TRANSESOPHAGEAL ECHOCARDIOGRAM (TEE);  Surgeon: Pixie Casino, MD;  Location: [facility] ENDOSCOPY;  Service: Cardiovascular;  Laterality: N/A;  . ULTRASOUND GUIDANCE FOR VASCULAR ACCESS Left 11/28/2016   Procedure: ULTRASOUND GUIDANCE FOR VASCULAR ACCESS, left iliac artery CPT 83419;  Surgeon: Corrie Mckusick, DO;  Location: [place];  Service: Anesthesiology;  Laterality: Left;    Family History  Problem Relation Age of Onset  . Hypertension Mother        born in [birth year]  . Anemia Mother   . Sudden Cardiac Death Father 67      Social History   Socioeconomic History  . Marital status: Single    Spouse name: None  . Number of children:  None  . Years of education: None  . Highest education level: None  Social Needs  . Financial resource strain: None  . Food insecurity - worry: None  . Food insecurity - inability: None  . Transportation needs - medical: None  . Transportation needs - non-medical: None  Occupational History  . Occupation: Programmer, applications, geologist  Tobacco Use  . Smoking status: Never Smoker  . Smokeless tobacco: Never Used  Substance and Sexual Activity  . Alcohol use: No  . Drug use: No  . Sexual activity: None  Other Topics Concern  . None  Social History Narrative   Pt lives with his wife.    Allergies  Allergen Reactions  . Scallops [Shellfish Allergy] Nausea And Vomiting     Current Outpatient Medications:  .  acetaminophen (TYLENOL) 500 MG tablet, Take 1,000 mg by mouth every 6 (six) hours as needed for moderate pain or headache., Disp: , Rfl:  .  doxycycline (VIBRA-TABS) 100 MG tablet, Take 1 tablet (100 mg total) by mouth 2 (two) times daily., Disp: 60 tablet, Rfl: 1 .  levothyroxine (SYNTHROID, LEVOTHROID) 50 MCG tablet, Take 50 mcg by mouth daily., Disp: , Rfl:  .  moxifloxacin (AVELOX) 400 MG tablet, Take 1 tablet (400 mg total) by mouth daily at 8 pm., Disp: 30 tablet, Rfl: 1 .  oxyCODONE 10 MG TABS, Take 1 tablet (10 mg total) by mouth every 3 (three) hours as needed for severe pain ((score 7 to 10))., Disp: 30 tablet, Rfl: 0 .  sertraline (ZOLOFT) 50 MG tablet, Take 50 mg by mouth daily. , Disp: , Rfl:  .  valACYclovir (VALTREX) 1000 MG tablet, Take 1,000 mg by mouth daily., Disp: , Rfl:  .  warfarin (COUMADIN) 2.5 MG tablet, Take 3-4.5 tablets (7.5-11.25 mg total) by mouth See admin instructions. Take 7.5 mg by mouth daily on Monday, Wednesday, Friday, Saturday and Sunday. Take 11.25 mg by mouth daily on Tuesday and Thursday., Disp: 135 tablet, Rfl: 0 .  Multiple Vitamin (MULTIVITAMIN WITH MINERALS) TABS tablet, Take 1 tablet by mouth daily. (Patient not taking: Reported on  06/04/2017), Disp: , Rfl:  .  Probiotic Product (PROBIOTIC PO), Take 1 capsule by mouth 2 (two) times daily., Disp: , Rfl:  .  traMADol (ULTRAM) 50 MG tablet, Take 1-2 tablets (50-100 mg total) by mouth every 6 (six) hours as needed. (Patient not taking: Reported on 06/04/2017), Disp: 45 tablet, Rfl: 0   Review of Systems  Constitutional: Negative for appetite change, chills and fever.  HENT:  Negative for congestion and sore throat.   Eyes: Negative for photophobia.  Respiratory: Negative for cough, shortness of breath and wheezing.   Cardiovascular: Negative for chest pain, palpitations and leg swelling.  Gastrointestinal: Negative for abdominal pain, blood in stool, constipation, diarrhea, nausea and vomiting.  Genitourinary: Negative for dysuria, flank pain and hematuria.  Musculoskeletal: Positive for arthralgias. Negative for myalgias.  Skin: Positive for color change and wound. Negative for rash.  Neurological: Negative for dizziness, weakness and headaches.  Hematological: Does not bruise/bleed easily.  Psychiatric/Behavioral: Negative for suicidal ideas.       Objective:   Physical Exam  Constitutional: He is oriented to person, place, and time. He appears well-developed and well-nourished. No distress.  HENT:  Head: Normocephalic and atraumatic.  Mouth/Throat: No oropharyngeal exudate.  Eyes: Conjunctivae and EOM are normal. No scleral icterus.  Neck: Normal range of motion. Neck supple.  Cardiovascular: Normal rate and regular rhythm.  Murmur heard. Metallic click of valve audible  Pulmonary/Chest: Effort normal. No respiratory distress. He has no wheezes.  Abdominal: Soft. He exhibits no distension. There is tenderness.  Musculoskeletal: He exhibits no edema or tenderness.  Neurological: He is alert and oriented to person, place, and time. He exhibits normal muscle tone. Coordination normal.  Skin: Skin is warm and dry. No rash noted. He is not diaphoretic. There is  erythema. No pallor.  Psychiatric: He has a normal mood and affect. His behavior is normal. Judgment and thought content normal.  Nursing note and vitals reviewed.     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 Marcelino Scot and ALL hardware is out besides some screws that are posterior and were not clearly involved in the anterior infected hardware and bone  ESR   August -->   12/18/2016 --> 12/26/16 --> 03/05/17-->-->03/11/17   05/22/17   84-->   -->     35-->--> 9-->-->-->-->   16 -->-->--> 33   CRP  12/17/16 12/18/2016 --> 12/26/16 --> 03/05/17-->-->03/11/17   05/22/17   23.6 -->-->-->-->--->-->-1.5-->-->-2.1-->-->--->--><0.3-->-->- 40.9  We will check labs today. Hopefully they have come back down. If not will prolong his abx and recommend he get plugged in to ID MD in [place].

## 2017-06-05 ENCOUNTER — Encounter: Payer: Self-pay | Admitting: Infectious Disease

## 2017-06-05 LAB — SEDIMENTATION RATE: Sed Rate: 14 mm/h (ref 0–20)

## 2017-06-05 LAB — C-REACTIVE PROTEIN: CRP: 16.7 mg/L — ABNORMAL HIGH (ref <8.0)

## 2017-06-06 ENCOUNTER — Telehealth: Payer: Self-pay | Admitting: Behavioral Health

## 2017-06-06 NOTE — Telephone Encounter (Signed)
-----   Message from Randall Hissornelius N Van Dam, MD sent at 06/05/2017  4:39 PM EST ----- His labs look much better. He can stop his antibiotics!

## 2017-06-06 NOTE — Telephone Encounter (Signed)
Called Mr. Caleb Dawson and let him know per Dr. Daiva EvesVan Dam that his labs look much better and he can stop taking his antibiotics which were Doxycycline and Moxifloxacin.  Mr. Caleb Dawson verbalized understand and had no additional questions.

## 2017-06-10 ENCOUNTER — Telehealth (INDEPENDENT_AMBULATORY_CARE_PROVIDER_SITE_OTHER): Payer: Self-pay | Admitting: Orthopedic Surgery

## 2017-06-10 NOTE — Telephone Encounter (Signed)
Patient called asked if it would be ok to walk without his walker. The number to contact patient is [phone]

## 2017-06-10 NOTE — Telephone Encounter (Signed)
y

## 2017-06-10 NOTE — Telephone Encounter (Signed)
Please advise. Thanks.  

## 2017-06-10 NOTE — Telephone Encounter (Signed)
IC patient and advised yes per Dr August Saucerean. LMVM

## 2017-06-30 ENCOUNTER — Encounter (INDEPENDENT_AMBULATORY_CARE_PROVIDER_SITE_OTHER): Payer: Self-pay | Admitting: Orthopedic Surgery

## 2017-06-30 ENCOUNTER — Ambulatory Visit (INDEPENDENT_AMBULATORY_CARE_PROVIDER_SITE_OTHER): Payer: BLUE CROSS/BLUE SHIELD | Admitting: Orthopedic Surgery

## 2017-06-30 DIAGNOSIS — S83512D Sprain of anterior cruciate ligament of left knee, subsequent encounter: Secondary | ICD-10-CM

## 2017-06-30 DIAGNOSIS — S8991XD Unspecified injury of right lower leg, subsequent encounter: Secondary | ICD-10-CM
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
## 2017-08-18 ENCOUNTER — Ambulatory Visit (INDEPENDENT_AMBULATORY_CARE_PROVIDER_SITE_OTHER): Payer: BLUE CROSS/BLUE SHIELD | Admitting: Orthopedic Surgery

## 2017-08-28 ENCOUNTER — Ambulatory Visit (INDEPENDENT_AMBULATORY_CARE_PROVIDER_SITE_OTHER): Payer: BLUE CROSS/BLUE SHIELD | Admitting: Orthopedic Surgery

## 2017-09-17 ENCOUNTER — Ambulatory Visit (INDEPENDENT_AMBULATORY_CARE_PROVIDER_SITE_OTHER): Payer: BLUE CROSS/BLUE SHIELD | Admitting: Orthopedic Surgery

## 2018-06-23 IMAGING — CT CT WRIST*R* W/O CM
3 of 4 series · 16 of 33 positions shown, 18 images · non-contrast
Comparison: None.

CLINICAL DATA: The Dislocation of carpometacarpal joint of right
hand

EXAM:
CT OF THE RIGHT WRIST WITHOUT CONTRAST
TECHNIQUE: Multidetector CT imaging of the right wrist was performed according
to the standard protocol. Multiplanar CT image reconstructions were
also generated.

[Series 4: lfov ext 3.0 i31s 2 · axial · 0.31mm/px · z∈[-702,-604]mm · 8 of 40 slices shown, 10 images]
[im 4/40  soft-tissue]
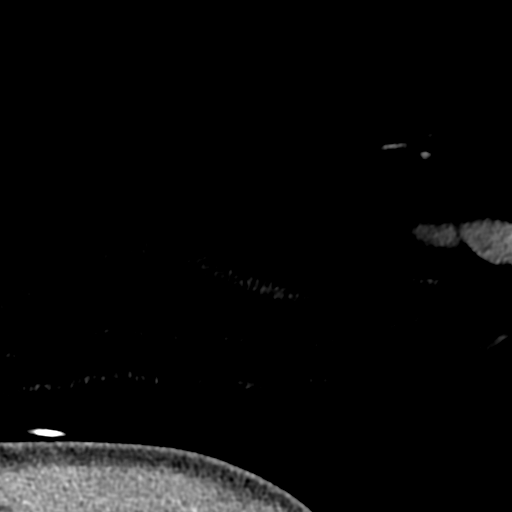
[im 4/40  bone]
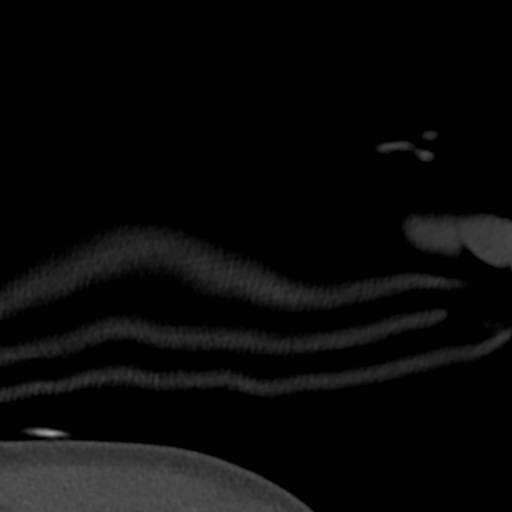
[im 10/40  bone]
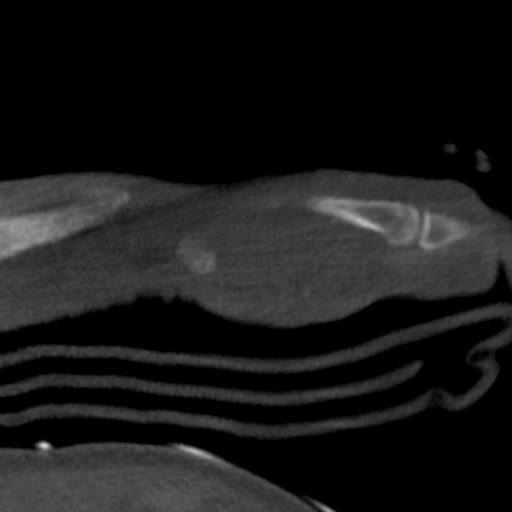
[im 13/40  bone]
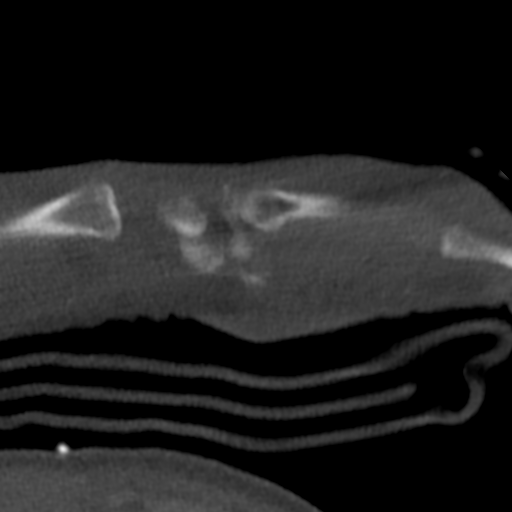
[im 19/40  bone]
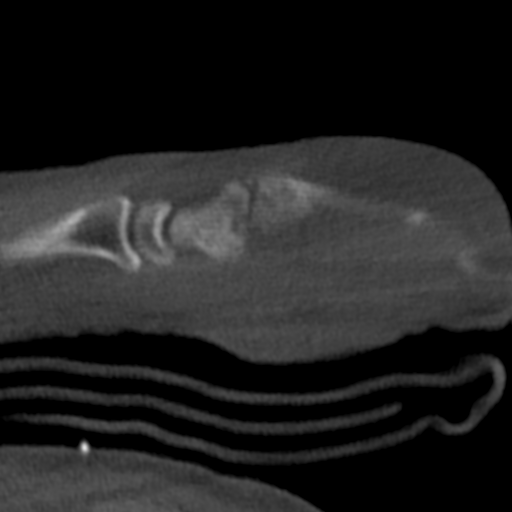
[im 22/40  soft-tissue]
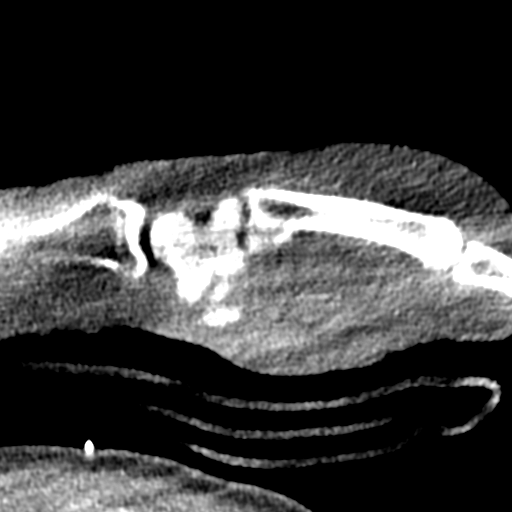
[im 22/40  bone]
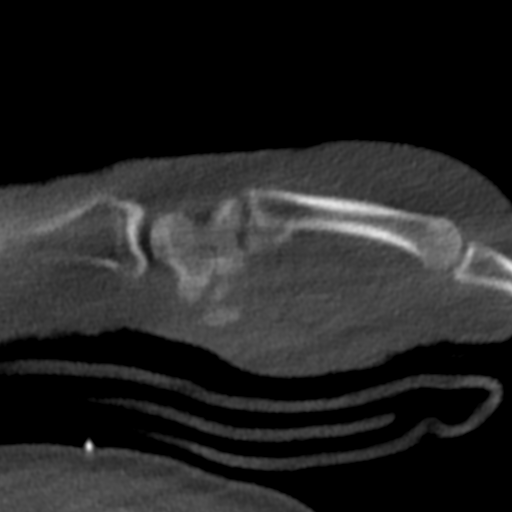
[im 28/40  bone]
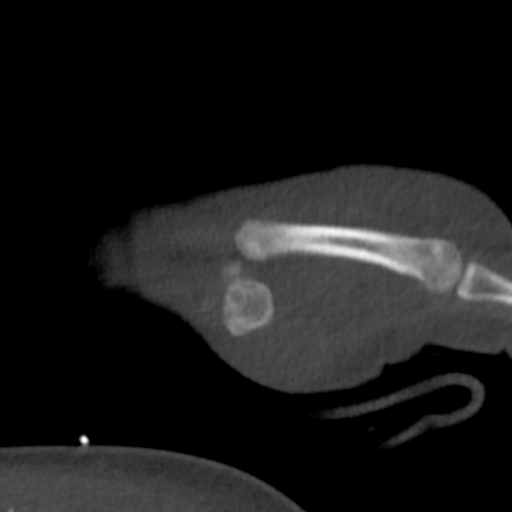
[im 31/40  bone]
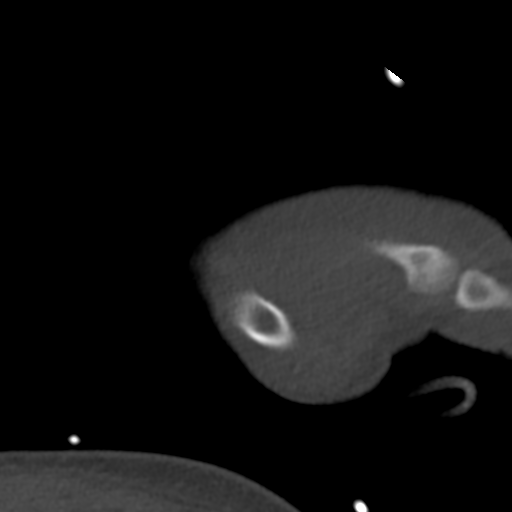
[im 37/40  bone]
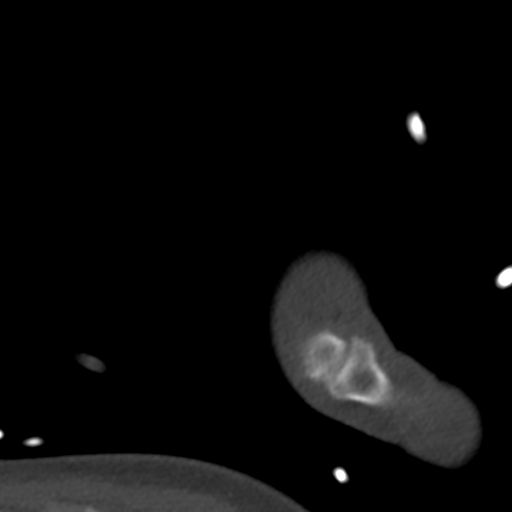

[Series 7: cor st · coronal · 0.20mm/px · 3 of 28 slices shown]
[im 6/28  bone]
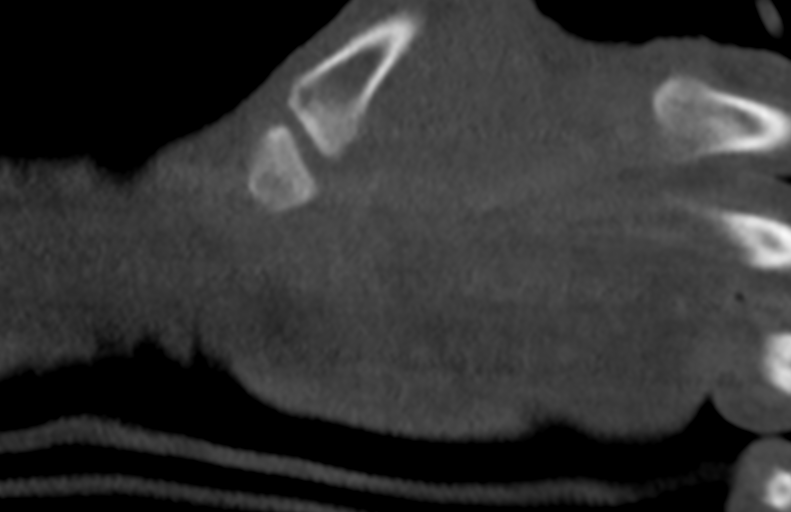
[im 11/28  bone]
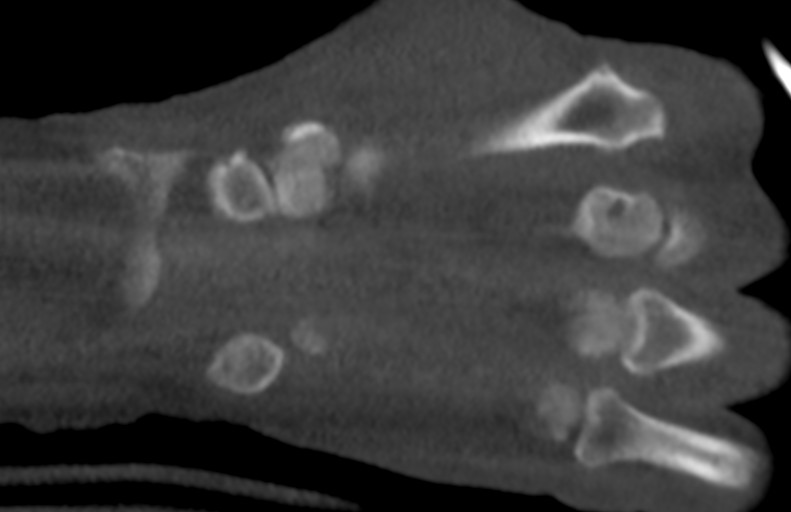
[im 17/28  bone]
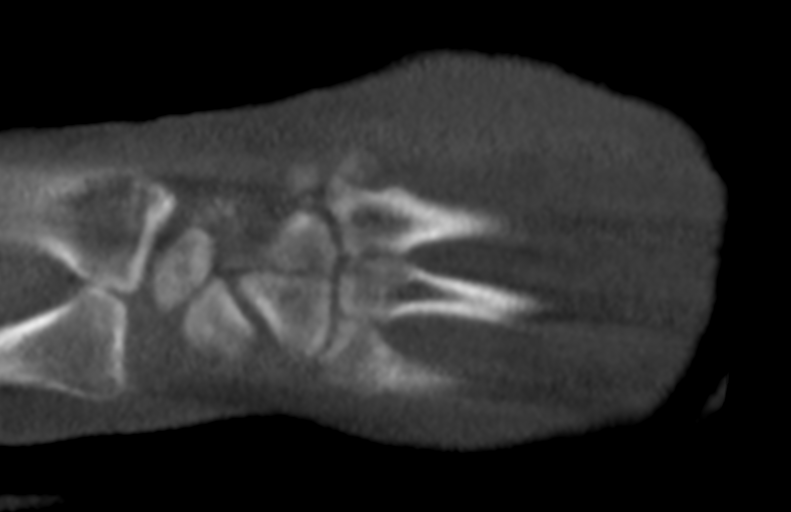

[Series 8: sag st · sagittal · 0.23mm/px · 5 of 50 slices shown]
[im 9/50  bone]
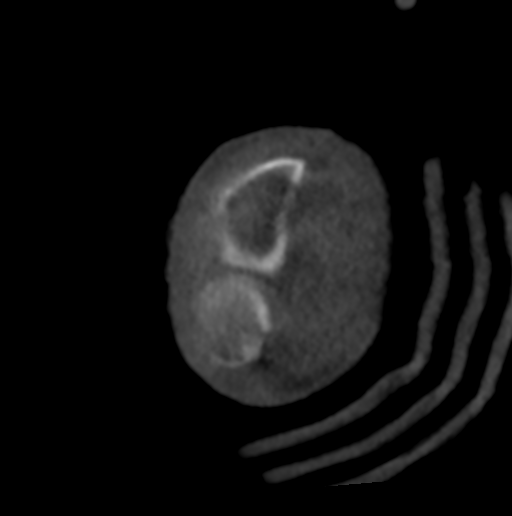
[im 17/50  bone]
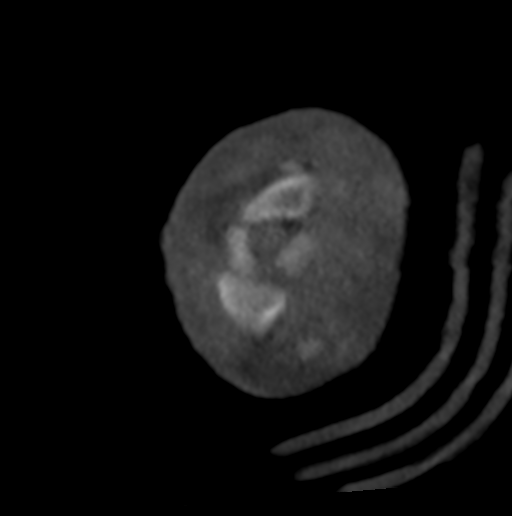
[im 25/50  bone]
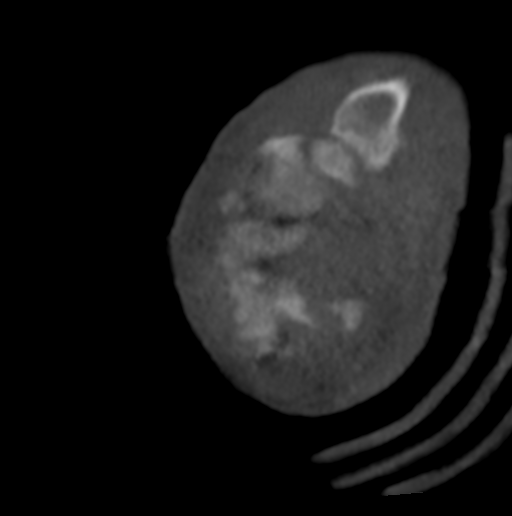
[im 33/50  bone]
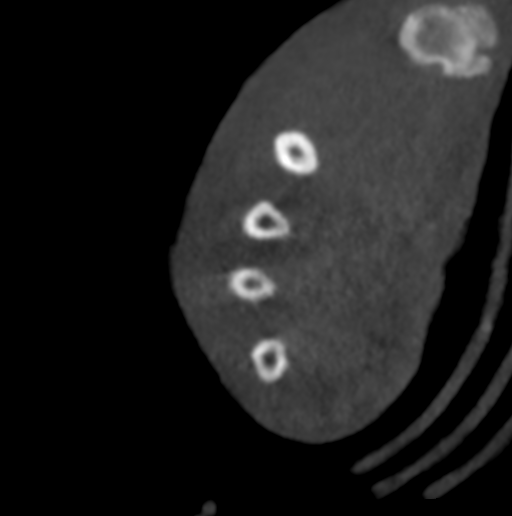
[im 41/50  bone]
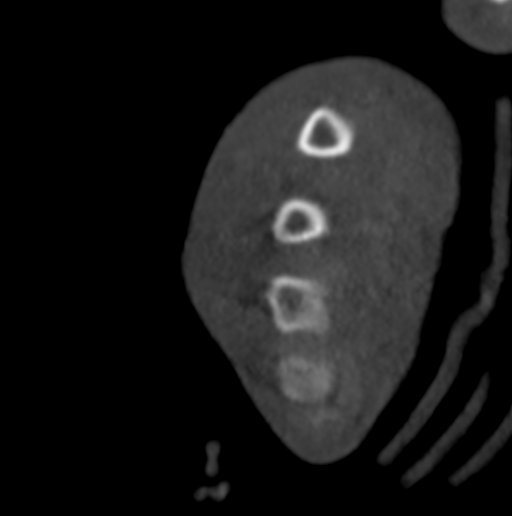

[16 of 33 positions shown; findings below may reference images not displayed]

FINDINGS: Overlying casting material degrades image quality limiting
evaluation.

Bones/Joint/Cartilage

Displaced fracture of the hook of the hamate with 5.6 mm of
distraction.

Small fracture along the dorsal base of the third metacarpal versus
distal dorsal corner of the capitate.

Normal alignment. No lytic or sclerotic osseous lesion.

Ligaments

Ligaments are suboptimally evaluated by CT.

Muscles and Tendons
The muscles are normal. Visualized flexor and extensor compartment
tendons are grossly intact.

Soft tissue
No fluid collection or hematoma.  No soft tissue mass.
IMPRESSION: 1. Displaced fracture of the hook of the hamate with 5.6 mm of
distraction.
2. Small fracture along the dorsal base of the third metacarpal
versus distal dorsal corner of the capitate.
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
## 2018-06-25 IMAGING — DX DG CHEST 1V PORT
1 series · 1 of 1 positions shown · non-contrast
Comparison: 12/04/16

CLINICAL DATA: Respiratory failure

EXAM:
PORTABLE CHEST 1 VIEW

[chest]
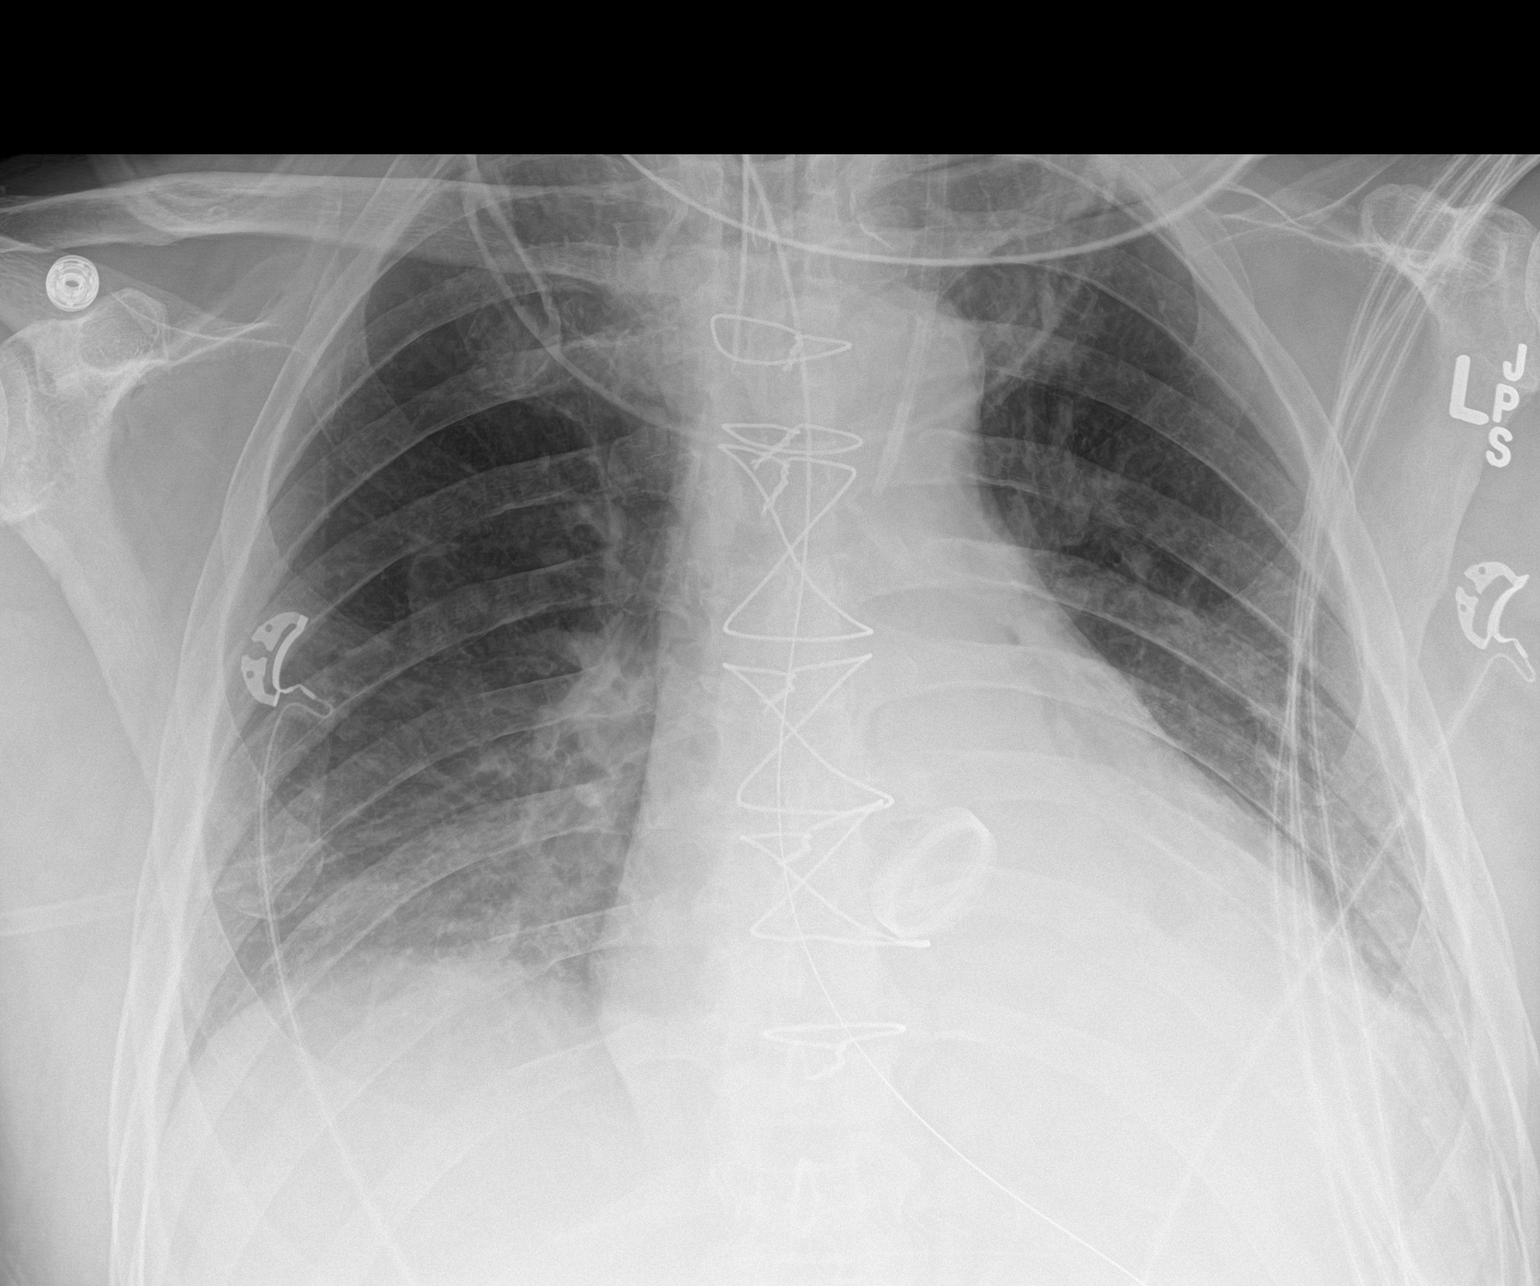

[1 of 1 positions shown; findings below may reference images not displayed]

FINDINGS: Cardiac shadow is stable. Postsurgical changes are again noted.
Endotracheal tube and nasogastric catheter as well as a left jugular
central line are again seen and stable. The lungs are well aerated
bilaterally. Bibasilar infiltrates are again seen left greater than
right. Small pleural effusions are noted as well.
IMPRESSION: No significant interval change from the prior exam.

## 2021-11-26 ENCOUNTER — Ambulatory Visit (HOSPITAL_COMMUNITY): Admit: 2021-11-26 | Payer: BLUE CROSS/BLUE SHIELD

## 2021-11-27 ENCOUNTER — Other Ambulatory Visit: Payer: Self-pay

## 2021-11-27 ENCOUNTER — Encounter (HOSPITAL_COMMUNITY): Payer: Self-pay

## 2021-11-27 ENCOUNTER — Ambulatory Visit (HOSPITAL_COMMUNITY)
Admission: EM | Admit: 2021-11-27 | Discharge: 2021-11-27 | Disposition: A | Discharge status: Home / Self Care | Payer: Self-pay | Attending: Family | Admitting: Family

## 2021-11-27 ENCOUNTER — Ambulatory Visit (INDEPENDENT_AMBULATORY_CARE_PROVIDER_SITE_OTHER): Payer: Self-pay

## 2021-11-27 VITALS — BP 124/70 | HR 52 | Temp 98.0°F | Resp 18

## 2021-11-27 DIAGNOSIS — M5442 Lumbago with sciatica, left side: Secondary | ICD-10-CM

## 2021-11-27 DIAGNOSIS — M5136 Other intervertebral disc degeneration, lumbar region: Secondary | ICD-10-CM

## 2021-11-27 DIAGNOSIS — M4316 Spondylolisthesis, lumbar region: Secondary | ICD-10-CM

## 2021-11-27 DIAGNOSIS — M545 Low back pain, unspecified: Secondary | ICD-10-CM
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
## 2021-11-27 NOTE — ED Triage Notes (Signed)
Pt reports back pain that radiates down Lt leg. Pt has had this pain for months and months. Pt support person thinks Pt needs a scan.

## 2021-11-27 NOTE — Discharge Instructions (Addendum)
Recommend continue OTC Tylenol 1000mg  every 8 hours as needed for pain. Continue to apply heat to area for comfort. Recommend contact [facility] to schedule appointment for further evaluation.

## 2022-04-19 DEATH — deceased
